# Patient Record
Sex: Male | Born: 1945 | Race: White | Hispanic: No | Marital: Single | Smoking: Former smoker
Health system: Southern US, Community
[De-identification: ages and names within clinical notes are randomized; demographics above are authoritative.]

## PROBLEM LIST (undated history)

## (undated) DIAGNOSIS — I4819 Other persistent atrial fibrillation: Secondary | ICD-10-CM

## (undated) DIAGNOSIS — I471 Supraventricular tachycardia, unspecified: Secondary | ICD-10-CM

## (undated) DIAGNOSIS — E039 Hypothyroidism, unspecified: Secondary | ICD-10-CM

## (undated) DIAGNOSIS — I10 Essential (primary) hypertension: Secondary | ICD-10-CM

## (undated) DIAGNOSIS — G4730 Sleep apnea, unspecified: Secondary | ICD-10-CM

## (undated) DIAGNOSIS — R972 Elevated prostate specific antigen [PSA]: Secondary | ICD-10-CM

## (undated) HISTORY — PX: APPENDECTOMY: SHX54

## (undated) HISTORY — PX: NM MYOVIEW LTD: HXRAD82

## (undated) HISTORY — DX: Sleep apnea, unspecified: G47.30

## (undated) HISTORY — DX: Elevated prostate specific antigen (PSA): R97.20

## (undated) HISTORY — DX: Hypothyroidism, unspecified: E03.9

## (undated) HISTORY — DX: Supraventricular tachycardia, unspecified: I47.10

## (undated) HISTORY — DX: Other persistent atrial fibrillation: I48.19

## (undated) HISTORY — DX: Morbid (severe) obesity due to excess calories: E66.01

## (undated) HISTORY — PX: TONSILLECTOMY: SUR1361

## (undated) HISTORY — DX: Supraventricular tachycardia: I47.1

## (undated) HISTORY — DX: Essential (primary) hypertension: I10

---

## 1999-12-25 HISTORY — PX: CARDIAC CATHETERIZATION: SHX172

## 2000-08-23 ENCOUNTER — Emergency Department (HOSPITAL_COMMUNITY): Admission: EM | Admit: 2000-08-23 | Discharge: 2000-08-23 | Payer: Self-pay | Admitting: Emergency Medicine

## 2000-08-23 ENCOUNTER — Encounter: Payer: Self-pay | Admitting: Emergency Medicine

## 2000-10-20 ENCOUNTER — Emergency Department (HOSPITAL_COMMUNITY): Admission: EM | Admit: 2000-10-20 | Discharge: 2000-10-20 | Payer: Self-pay | Admitting: Emergency Medicine

## 2000-11-14 ENCOUNTER — Inpatient Hospital Stay (HOSPITAL_COMMUNITY): Admission: EM | Admit: 2000-11-14 | Discharge: 2000-11-19 | Payer: Self-pay | Admitting: Emergency Medicine

## 2000-11-14 ENCOUNTER — Encounter: Payer: Self-pay | Admitting: Emergency Medicine

## 2000-12-03 ENCOUNTER — Encounter: Payer: Self-pay | Admitting: Emergency Medicine

## 2000-12-03 ENCOUNTER — Emergency Department (HOSPITAL_COMMUNITY): Admission: EM | Admit: 2000-12-03 | Discharge: 2000-12-03 | Payer: Self-pay | Admitting: Emergency Medicine

## 2000-12-24 HISTORY — PX: ABLATION: SHX5711

## 2000-12-27 ENCOUNTER — Encounter: Payer: Self-pay | Admitting: Emergency Medicine

## 2000-12-27 ENCOUNTER — Emergency Department (HOSPITAL_COMMUNITY): Admission: EM | Admit: 2000-12-27 | Discharge: 2000-12-27 | Payer: Self-pay | Admitting: Emergency Medicine

## 2000-12-30 ENCOUNTER — Ambulatory Visit (HOSPITAL_COMMUNITY): Admission: RE | Admit: 2000-12-30 | Discharge: 2000-12-31 | Payer: Self-pay | Admitting: Internal Medicine

## 2005-08-23 ENCOUNTER — Ambulatory Visit: Payer: Self-pay | Admitting: Internal Medicine

## 2005-10-10 ENCOUNTER — Ambulatory Visit: Payer: Self-pay | Admitting: Internal Medicine

## 2006-01-09 ENCOUNTER — Ambulatory Visit: Payer: Self-pay | Admitting: Internal Medicine

## 2006-03-04 ENCOUNTER — Ambulatory Visit: Payer: Self-pay | Admitting: Cardiology

## 2006-03-04 ENCOUNTER — Encounter: Payer: Self-pay | Admitting: Internal Medicine

## 2006-06-10 ENCOUNTER — Ambulatory Visit: Payer: Self-pay | Admitting: Internal Medicine

## 2006-11-26 ENCOUNTER — Ambulatory Visit: Payer: Self-pay | Admitting: Internal Medicine

## 2006-11-26 LAB — CONVERTED CEMR LAB
ALT: 27 units/L (ref 0–40)
AST: 25 units/L (ref 0–37)
BUN: 15 mg/dL (ref 6–23)
CO2: 30 meq/L (ref 19–32)
Calcium: 9.3 mg/dL (ref 8.4–10.5)
Chloride: 103 meq/L (ref 96–112)
Chol/HDL Ratio, serum: 4.6
Cholesterol: 154 mg/dL (ref 0–200)
Creatinine, Ser: 1.2 mg/dL (ref 0.4–1.5)
GFR calc non Af Amer: 65 mL/min
Glomerular Filtration Rate, Af Am: 79 mL/min/{1.73_m2}
Glucose, Bld: 99 mg/dL (ref 70–99)
HDL: 33.4 mg/dL — ABNORMAL LOW (ref >39.0)
LDL Cholesterol: 100 mg/dL — ABNORMAL HIGH (ref 0–99)
PSA: 6.35 ng/mL — ABNORMAL HIGH (ref 0.10–4.00)
Potassium: 3.9 meq/L (ref 3.5–5.1)
Sodium: 140 meq/L (ref 135–145)
Triglyceride fasting, serum: 101 mg/dL (ref 0–149)
VLDL: 20 mg/dL (ref 0–40)

## 2007-05-23 DIAGNOSIS — I10 Essential (primary) hypertension: Secondary | ICD-10-CM

## 2007-06-04 ENCOUNTER — Encounter (INDEPENDENT_AMBULATORY_CARE_PROVIDER_SITE_OTHER): Payer: Self-pay | Admitting: *Deleted

## 2007-06-11 ENCOUNTER — Telehealth: Payer: Self-pay | Admitting: Internal Medicine

## 2007-06-11 ENCOUNTER — Encounter (INDEPENDENT_AMBULATORY_CARE_PROVIDER_SITE_OTHER): Payer: Self-pay | Admitting: *Deleted

## 2007-06-12 ENCOUNTER — Ambulatory Visit: Payer: Self-pay | Admitting: Internal Medicine

## 2007-06-12 DIAGNOSIS — R972 Elevated prostate specific antigen [PSA]: Secondary | ICD-10-CM

## 2007-12-09 ENCOUNTER — Encounter (INDEPENDENT_AMBULATORY_CARE_PROVIDER_SITE_OTHER): Payer: Self-pay | Admitting: *Deleted

## 2008-01-08 ENCOUNTER — Telehealth (INDEPENDENT_AMBULATORY_CARE_PROVIDER_SITE_OTHER): Payer: Self-pay | Admitting: *Deleted

## 2008-01-12 ENCOUNTER — Encounter (INDEPENDENT_AMBULATORY_CARE_PROVIDER_SITE_OTHER): Payer: Self-pay | Admitting: *Deleted

## 2008-02-03 ENCOUNTER — Ambulatory Visit: Payer: Self-pay | Admitting: Internal Medicine

## 2008-02-03 DIAGNOSIS — G4700 Insomnia, unspecified: Secondary | ICD-10-CM

## 2008-02-06 LAB — CONVERTED CEMR LAB
BUN: 11 mg/dL (ref 6–23)
Basophils Relative: 2.4 % — ABNORMAL HIGH (ref 0.0–1.0)
CO2: 31 meq/L (ref 19–32)
Calcium: 9 mg/dL (ref 8.4–10.5)
Chloride: 103 meq/L (ref 96–112)
Creatinine, Ser: 1.2 mg/dL (ref 0.4–1.5)
Eosinophils Relative: 4.1 % (ref 0.0–5.0)
GFR calc Af Amer: 79 mL/min
GFR calc non Af Amer: 65 mL/min
Glucose, Bld: 83 mg/dL (ref 70–99)
HCT: 48.2 % (ref 39.0–52.0)
Hemoglobin: 15.6 g/dL (ref 13.0–17.0)
Lymphocytes Relative: 37.3 % (ref 12.0–46.0)
MCHC: 32.4 g/dL (ref 30.0–36.0)
MCV: 91.3 fL (ref 78.0–100.0)
Monocytes Relative: 25.4 % — ABNORMAL HIGH (ref 3.0–11.0)
Neutrophils Relative %: 30.8 % — ABNORMAL LOW (ref 43.0–77.0)
PSA: 5.31 ng/mL — ABNORMAL HIGH (ref 0.10–4.00)
Platelets: 184 10*3/uL (ref 150–400)
Potassium: 3.6 meq/L (ref 3.5–5.1)
RBC: 5.29 M/uL (ref 4.22–5.81)
RDW: 13.1 % (ref 11.5–14.6)
Sodium: 142 meq/L (ref 135–145)
TSH: 5.16 microintl units/mL (ref 0.35–5.50)
WBC: 7.8 10*3/uL (ref 4.5–10.5)

## 2008-04-23 ENCOUNTER — Telehealth (INDEPENDENT_AMBULATORY_CARE_PROVIDER_SITE_OTHER): Payer: Self-pay | Admitting: *Deleted

## 2008-09-15 ENCOUNTER — Emergency Department (HOSPITAL_COMMUNITY): Admission: EM | Admit: 2008-09-15 | Discharge: 2008-09-15 | Payer: Self-pay | Admitting: Emergency Medicine

## 2008-10-12 ENCOUNTER — Ambulatory Visit: Payer: Self-pay | Admitting: Internal Medicine

## 2008-10-13 ENCOUNTER — Encounter (INDEPENDENT_AMBULATORY_CARE_PROVIDER_SITE_OTHER): Payer: Self-pay | Admitting: *Deleted

## 2008-10-18 ENCOUNTER — Telehealth (INDEPENDENT_AMBULATORY_CARE_PROVIDER_SITE_OTHER): Payer: Self-pay | Admitting: *Deleted

## 2008-10-19 ENCOUNTER — Telehealth (INDEPENDENT_AMBULATORY_CARE_PROVIDER_SITE_OTHER): Payer: Self-pay | Admitting: *Deleted

## 2008-10-28 ENCOUNTER — Telehealth (INDEPENDENT_AMBULATORY_CARE_PROVIDER_SITE_OTHER): Payer: Self-pay | Admitting: *Deleted

## 2008-11-08 ENCOUNTER — Ambulatory Visit: Payer: Self-pay | Admitting: Cardiovascular Disease

## 2008-11-23 ENCOUNTER — Encounter: Payer: Self-pay | Admitting: Internal Medicine

## 2008-11-23 ENCOUNTER — Ambulatory Visit: Payer: Self-pay

## 2009-01-11 ENCOUNTER — Ambulatory Visit: Payer: Self-pay | Admitting: Internal Medicine

## 2009-01-11 DIAGNOSIS — G8929 Other chronic pain: Secondary | ICD-10-CM

## 2009-01-11 DIAGNOSIS — M549 Dorsalgia, unspecified: Secondary | ICD-10-CM

## 2009-01-14 ENCOUNTER — Telehealth (INDEPENDENT_AMBULATORY_CARE_PROVIDER_SITE_OTHER): Payer: Self-pay | Admitting: *Deleted

## 2009-01-14 LAB — CONVERTED CEMR LAB
CO2: 29 meq/L (ref 19–32)
Chloride: 108 meq/L (ref 96–112)
Potassium: 3.9 meq/L (ref 3.5–5.1)

## 2009-02-10 ENCOUNTER — Ambulatory Visit: Payer: Self-pay | Admitting: Internal Medicine

## 2009-02-15 ENCOUNTER — Telehealth (INDEPENDENT_AMBULATORY_CARE_PROVIDER_SITE_OTHER): Payer: Self-pay | Admitting: *Deleted

## 2009-03-02 ENCOUNTER — Encounter (INDEPENDENT_AMBULATORY_CARE_PROVIDER_SITE_OTHER): Payer: Self-pay | Admitting: *Deleted

## 2009-03-11 ENCOUNTER — Telehealth: Payer: Self-pay | Admitting: Internal Medicine

## 2009-03-15 ENCOUNTER — Inpatient Hospital Stay (HOSPITAL_COMMUNITY): Admission: EM | Admit: 2009-03-15 | Discharge: 2009-03-17 | Payer: Self-pay | Admitting: Emergency Medicine

## 2009-03-15 ENCOUNTER — Ambulatory Visit: Payer: Self-pay | Admitting: Internal Medicine

## 2009-03-17 ENCOUNTER — Encounter: Payer: Self-pay | Admitting: Cardiovascular Disease

## 2009-03-18 ENCOUNTER — Telehealth (INDEPENDENT_AMBULATORY_CARE_PROVIDER_SITE_OTHER): Payer: Self-pay | Admitting: *Deleted

## 2009-03-18 ENCOUNTER — Telehealth: Payer: Self-pay | Admitting: Internal Medicine

## 2009-03-23 ENCOUNTER — Ambulatory Visit: Payer: Self-pay | Admitting: Internal Medicine

## 2009-03-23 DIAGNOSIS — I4891 Unspecified atrial fibrillation: Secondary | ICD-10-CM | POA: Insufficient documentation

## 2009-03-23 DIAGNOSIS — E079 Disorder of thyroid, unspecified: Secondary | ICD-10-CM | POA: Insufficient documentation

## 2009-03-31 ENCOUNTER — Telehealth (INDEPENDENT_AMBULATORY_CARE_PROVIDER_SITE_OTHER): Payer: Self-pay | Admitting: *Deleted

## 2009-04-12 DIAGNOSIS — R011 Cardiac murmur, unspecified: Secondary | ICD-10-CM

## 2009-04-12 DIAGNOSIS — E785 Hyperlipidemia, unspecified: Secondary | ICD-10-CM | POA: Insufficient documentation

## 2009-04-12 DIAGNOSIS — G510 Bell's palsy: Secondary | ICD-10-CM

## 2009-04-13 ENCOUNTER — Telehealth (INDEPENDENT_AMBULATORY_CARE_PROVIDER_SITE_OTHER): Payer: Self-pay | Admitting: Nurse Practitioner

## 2009-04-13 ENCOUNTER — Encounter: Payer: Self-pay | Admitting: Cardiovascular Disease

## 2009-04-13 ENCOUNTER — Ambulatory Visit: Payer: Self-pay | Admitting: Cardiovascular Disease

## 2009-04-13 ENCOUNTER — Ambulatory Visit: Payer: Self-pay

## 2009-04-21 ENCOUNTER — Ambulatory Visit: Payer: Self-pay | Admitting: Internal Medicine

## 2009-05-06 ENCOUNTER — Telehealth (INDEPENDENT_AMBULATORY_CARE_PROVIDER_SITE_OTHER): Payer: Self-pay | Admitting: *Deleted

## 2009-05-11 ENCOUNTER — Telehealth (INDEPENDENT_AMBULATORY_CARE_PROVIDER_SITE_OTHER): Payer: Self-pay | Admitting: *Deleted

## 2009-06-06 ENCOUNTER — Telehealth (INDEPENDENT_AMBULATORY_CARE_PROVIDER_SITE_OTHER): Payer: Self-pay | Admitting: *Deleted

## 2009-07-04 ENCOUNTER — Ambulatory Visit: Payer: Self-pay | Admitting: Internal Medicine

## 2009-07-07 LAB — CONVERTED CEMR LAB
BUN: 19 mg/dL (ref 6–23)
Calcium: 9.1 mg/dL (ref 8.4–10.5)
GFR calc non Af Amer: 59.01 mL/min (ref >60)
Glucose, Bld: 93 mg/dL (ref 70–99)
Potassium: 4 meq/L (ref 3.5–5.1)
Sodium: 141 meq/L (ref 135–145)
T3, Free: 3.2 pg/mL (ref 2.3–4.2)
TSH: 4.31 microintl units/mL (ref 0.35–5.50)

## 2009-07-08 ENCOUNTER — Telehealth (INDEPENDENT_AMBULATORY_CARE_PROVIDER_SITE_OTHER): Payer: Self-pay | Admitting: *Deleted

## 2009-07-20 ENCOUNTER — Telehealth: Payer: Self-pay | Admitting: Internal Medicine

## 2009-08-10 ENCOUNTER — Telehealth: Payer: Self-pay | Admitting: Internal Medicine

## 2009-08-23 ENCOUNTER — Encounter (INDEPENDENT_AMBULATORY_CARE_PROVIDER_SITE_OTHER): Payer: Self-pay | Admitting: *Deleted

## 2009-08-23 ENCOUNTER — Telehealth (INDEPENDENT_AMBULATORY_CARE_PROVIDER_SITE_OTHER): Payer: Self-pay | Admitting: *Deleted

## 2009-09-13 ENCOUNTER — Telehealth (INDEPENDENT_AMBULATORY_CARE_PROVIDER_SITE_OTHER): Payer: Self-pay | Admitting: *Deleted

## 2009-09-29 ENCOUNTER — Telehealth (INDEPENDENT_AMBULATORY_CARE_PROVIDER_SITE_OTHER): Payer: Self-pay | Admitting: *Deleted

## 2009-09-30 ENCOUNTER — Telehealth: Payer: Self-pay | Admitting: Internal Medicine

## 2009-10-05 ENCOUNTER — Telehealth (INDEPENDENT_AMBULATORY_CARE_PROVIDER_SITE_OTHER): Payer: Self-pay | Admitting: *Deleted

## 2009-12-13 ENCOUNTER — Ambulatory Visit: Payer: Self-pay | Admitting: Internal Medicine

## 2009-12-14 ENCOUNTER — Encounter (INDEPENDENT_AMBULATORY_CARE_PROVIDER_SITE_OTHER): Payer: Self-pay | Admitting: *Deleted

## 2009-12-15 ENCOUNTER — Telehealth (INDEPENDENT_AMBULATORY_CARE_PROVIDER_SITE_OTHER): Payer: Self-pay | Admitting: *Deleted

## 2009-12-15 LAB — CONVERTED CEMR LAB
ALT: 43 units/L (ref 0–53)
BUN: 20 mg/dL (ref 6–23)
Calcium: 9.1 mg/dL (ref 8.4–10.5)
Eosinophils Relative: 3.6 % (ref 0.0–5.0)
GFR calc non Af Amer: 58.92 mL/min (ref >60)
Glucose, Bld: 91 mg/dL (ref 70–99)
HCT: 47.8 % (ref 39.0–52.0)
LDL Cholesterol: 121 mg/dL — ABNORMAL HIGH (ref 0–99)
Lymphocytes Relative: 36.8 % (ref 12.0–46.0)
Monocytes Relative: 13.9 % — ABNORMAL HIGH (ref 3.0–12.0)
PSA: 6.9 ng/mL — ABNORMAL HIGH (ref 0.10–4.00)
Platelets: 136 10*3/uL — ABNORMAL LOW (ref 150.0–400.0)
Potassium: 3.9 meq/L (ref 3.5–5.1)
RDW: 12.8 % (ref 11.5–14.6)
Sodium: 142 meq/L (ref 135–145)
TSH: 3.81 microintl units/mL (ref 0.35–5.50)
Total CHOL/HDL Ratio: 6
WBC: 8 10*3/uL (ref 4.5–10.5)

## 2009-12-20 ENCOUNTER — Encounter (INDEPENDENT_AMBULATORY_CARE_PROVIDER_SITE_OTHER): Payer: Self-pay | Admitting: *Deleted

## 2010-01-06 ENCOUNTER — Telehealth (INDEPENDENT_AMBULATORY_CARE_PROVIDER_SITE_OTHER): Payer: Self-pay | Admitting: *Deleted

## 2010-01-09 ENCOUNTER — Telehealth (INDEPENDENT_AMBULATORY_CARE_PROVIDER_SITE_OTHER): Payer: Self-pay | Admitting: *Deleted

## 2010-01-09 ENCOUNTER — Encounter: Payer: Self-pay | Admitting: Internal Medicine

## 2010-01-19 ENCOUNTER — Ambulatory Visit: Payer: Self-pay | Admitting: Internal Medicine

## 2010-01-19 DIAGNOSIS — E669 Obesity, unspecified: Secondary | ICD-10-CM

## 2010-01-19 DIAGNOSIS — F17200 Nicotine dependence, unspecified, uncomplicated: Secondary | ICD-10-CM | POA: Insufficient documentation

## 2010-01-20 ENCOUNTER — Encounter (INDEPENDENT_AMBULATORY_CARE_PROVIDER_SITE_OTHER): Payer: Self-pay | Admitting: *Deleted

## 2010-02-07 ENCOUNTER — Telehealth: Payer: Self-pay | Admitting: Internal Medicine

## 2010-04-10 ENCOUNTER — Telehealth (INDEPENDENT_AMBULATORY_CARE_PROVIDER_SITE_OTHER): Payer: Self-pay | Admitting: *Deleted

## 2010-05-30 ENCOUNTER — Telehealth: Payer: Self-pay | Admitting: Internal Medicine

## 2010-06-05 ENCOUNTER — Telehealth (INDEPENDENT_AMBULATORY_CARE_PROVIDER_SITE_OTHER): Payer: Self-pay | Admitting: *Deleted

## 2010-07-05 ENCOUNTER — Telehealth (INDEPENDENT_AMBULATORY_CARE_PROVIDER_SITE_OTHER): Payer: Self-pay | Admitting: *Deleted

## 2010-07-12 ENCOUNTER — Telehealth (INDEPENDENT_AMBULATORY_CARE_PROVIDER_SITE_OTHER): Payer: Self-pay | Admitting: *Deleted

## 2010-07-19 ENCOUNTER — Ambulatory Visit: Payer: Self-pay | Admitting: Family Medicine

## 2010-07-19 DIAGNOSIS — B354 Tinea corporis: Secondary | ICD-10-CM | POA: Insufficient documentation

## 2010-08-07 ENCOUNTER — Telehealth (INDEPENDENT_AMBULATORY_CARE_PROVIDER_SITE_OTHER): Payer: Self-pay | Admitting: *Deleted

## 2010-08-22 ENCOUNTER — Telehealth (INDEPENDENT_AMBULATORY_CARE_PROVIDER_SITE_OTHER): Payer: Self-pay | Admitting: *Deleted

## 2010-09-05 ENCOUNTER — Telehealth (INDEPENDENT_AMBULATORY_CARE_PROVIDER_SITE_OTHER): Payer: Self-pay | Admitting: *Deleted

## 2010-09-13 ENCOUNTER — Ambulatory Visit: Payer: Self-pay | Admitting: Internal Medicine

## 2010-09-15 LAB — CONVERTED CEMR LAB
Calcium: 9.7 mg/dL (ref 8.4–10.5)
GFR calc non Af Amer: 61.51 mL/min (ref >60)
Glucose, Bld: 85 mg/dL (ref 70–99)
Potassium: 3.9 meq/L (ref 3.5–5.1)
Sodium: 140 meq/L (ref 135–145)

## 2010-09-25 ENCOUNTER — Telehealth: Payer: Self-pay | Admitting: Internal Medicine

## 2010-10-03 ENCOUNTER — Telehealth: Payer: Self-pay | Admitting: Internal Medicine

## 2010-10-03 ENCOUNTER — Telehealth (INDEPENDENT_AMBULATORY_CARE_PROVIDER_SITE_OTHER): Payer: Self-pay | Admitting: *Deleted

## 2010-10-18 ENCOUNTER — Telehealth: Payer: Self-pay | Admitting: Internal Medicine

## 2010-11-03 ENCOUNTER — Telehealth (INDEPENDENT_AMBULATORY_CARE_PROVIDER_SITE_OTHER): Payer: Self-pay | Admitting: *Deleted

## 2010-11-23 ENCOUNTER — Telehealth (INDEPENDENT_AMBULATORY_CARE_PROVIDER_SITE_OTHER): Payer: Self-pay | Admitting: *Deleted

## 2010-12-05 ENCOUNTER — Telehealth (INDEPENDENT_AMBULATORY_CARE_PROVIDER_SITE_OTHER): Payer: Self-pay | Admitting: *Deleted

## 2010-12-14 ENCOUNTER — Ambulatory Visit: Payer: Self-pay | Admitting: Internal Medicine

## 2010-12-19 ENCOUNTER — Encounter (INDEPENDENT_AMBULATORY_CARE_PROVIDER_SITE_OTHER): Payer: Self-pay | Admitting: *Deleted

## 2010-12-19 ENCOUNTER — Telehealth (INDEPENDENT_AMBULATORY_CARE_PROVIDER_SITE_OTHER): Payer: Self-pay | Admitting: *Deleted

## 2010-12-21 ENCOUNTER — Telehealth (INDEPENDENT_AMBULATORY_CARE_PROVIDER_SITE_OTHER): Payer: Self-pay | Admitting: *Deleted

## 2010-12-27 ENCOUNTER — Encounter (INDEPENDENT_AMBULATORY_CARE_PROVIDER_SITE_OTHER): Payer: Self-pay | Admitting: *Deleted

## 2010-12-29 LAB — CONVERTED CEMR LAB
AST: 26 units/L (ref 0–37)
Basophils Relative: 0.6 % (ref 0.0–3.0)
CO2: 27 meq/L (ref 19–32)
Chloride: 103 meq/L (ref 96–112)
Cholesterol: 169 mg/dL (ref 0–200)
Creatinine, Ser: 1.3 mg/dL (ref 0.4–1.5)
Eosinophils Relative: 2.1 % (ref 0.0–5.0)
Free T4: 0.9 ng/dL (ref 0.60–1.60)
HDL: 31.9 mg/dL — ABNORMAL LOW (ref >39.00)
Hemoglobin: 14.9 g/dL (ref 13.0–17.0)
MCV: 90.5 fL (ref 78.0–100.0)
Monocytes Absolute: 0.8 10*3/uL (ref 0.1–1.0)
Neutro Abs: 5.7 10*3/uL (ref 1.4–7.7)
Neutrophils Relative %: 57.7 % (ref 43.0–77.0)
PSA: 5.4 ng/mL — ABNORMAL HIGH (ref 0.10–4.00)
Potassium: 3.6 meq/L (ref 3.5–5.1)
RBC: 4.88 M/uL (ref 4.22–5.81)
Sodium: 140 meq/L (ref 135–145)
T3, Free: 3.7 pg/mL (ref 2.3–4.2)
WBC: 9.9 10*3/uL (ref 4.5–10.5)

## 2011-01-02 ENCOUNTER — Telehealth (INDEPENDENT_AMBULATORY_CARE_PROVIDER_SITE_OTHER): Payer: Self-pay | Admitting: *Deleted

## 2011-01-03 ENCOUNTER — Telehealth: Payer: Self-pay | Admitting: Internal Medicine

## 2011-01-11 ENCOUNTER — Telehealth: Payer: Self-pay | Admitting: Internal Medicine

## 2011-01-21 LAB — CONVERTED CEMR LAB
BUN: 13 mg/dL (ref 6–23)
CO2: 30 meq/L (ref 19–32)
Calcium: 9.1 mg/dL (ref 8.4–10.5)
Chloride: 104 meq/L (ref 96–112)
Chloride: 104 meq/L (ref 96–112)
Cholesterol: 170 mg/dL (ref 0–200)
Creatinine, Ser: 1.1 mg/dL (ref 0.4–1.5)
GFR calc non Af Amer: 59.06 mL/min (ref >60)
Glucose, Bld: 76 mg/dL (ref 70–99)
HDL: 30.9 mg/dL — ABNORMAL LOW (ref >39.0)
Potassium: 4.3 meq/L (ref 3.5–5.1)
Triglycerides: 126 mg/dL (ref 0–149)

## 2011-01-23 NOTE — Progress Notes (Signed)
Summary: refill  Phone Note Refill Request Message from:  Fax from Pharmacy on July 12, 2010 9:18 AM  Refills Requested: Medication #1:  OMEPRAZOLE 40 MG CPDR 1 by mouth once daily [facility] - fax [phone] - tel [phone]   Method Requested:   Initial call taken by: Okey Regal Spring,  July 12, 2010 9:20 AM    Prescriptions: OMEPRAZOLE 40 MG CPDR (OMEPRAZOLE) 1 by mouth once daily  #30 x 2   Entered by:   [name] CMA   Authorized by:   Nolon Rod. Paz MD   Signed by:   [name] CMA on 07/12/2010   Method used:   Electronically to        [facility] (retail)       [address]       [place], [place]  [address]       Ph: [phone]       Fax: [phone]   RxID:   [id number]

## 2011-01-23 NOTE — Progress Notes (Signed)
Summary: REFILL  Phone Note Refill Request Message from:  Fax from Pharmacy on February 07, 2010 4:49 PM  Refills Requested: Medication #1:  XANAX 0.5 MG  TABS 2 by mouth at bedtime as needed insomnia METOPROLOL SUCCINATE 200MG  [facility] FAX [phone]   Method Requested: Fax to Local Pharmacy Next Appointment Scheduled: JUNE 21,2011 Initial call taken by: Barb Merino,  February 07, 2010 4:50 PM  Follow-up for Phone Call        xanax - was rx'd #60 with 6 refills 08/12/2009 Shary Decamp  February 07, 2010 5:19 PM ok 60 and 6 RF Shali Vesey E. Haeven Nickle MD  February 08, 2010 12:10 PM  faxed to [facility]  February 08, 2010 2:36 PM     Prescriptions: TOPROL XL 200 MG  TB24 (METOPROLOL SUCCINATE) 1/2 by mouth qd  #45 x 1   Entered by:   Shary Decamp   Authorized by:   Nolon Rod. Trystyn Dolley MD   Signed by:   Shary Decamp on 02/08/2010   Method used:   Print then Give to Patient   RxID:   [id number] XANAX 0.5 MG  TABS (ALPRAZOLAM) 2 by mouth at bedtime as needed insomnia  #60 x 6   Entered by:   Shary Decamp   Authorized by:   Nolon Rod. Tarek Cravens MD   Signed by:   Shary Decamp on 02/08/2010   Method used:   Print then Give to Patient   RxID:   [id number]

## 2011-01-23 NOTE — Progress Notes (Signed)
Summary: refill  Phone Note Refill Request Message from:  Pharmacy on November 03, 2010 9:16 AM  Refills Requested: Medication #1:  TOPROL XL 200 MG  TB24 1/2 by mouth qd [facility] - fax [phone]  Initial call taken by: Okey Regal Spring,  November 03, 2010 9:31 AM    Prescriptions: TOPROL XL 200 MG  TB24 (METOPROLOL SUCCINATE) 1/2 by mouth qd  #45 x 0   Entered by:   [name] CMA   Authorized by:   Nolon Rod. Paz MD   Signed by:   [name] CMA on 11/03/2010   Method used:   Electronically to        [facility] (retail)       [address]       [place], [place]  [address]       Ph: [phone]       Fax: [phone]   RxID:   [id number]

## 2011-01-23 NOTE — Progress Notes (Signed)
Summary: refill request  Phone Note Refill Request Call back at [phone] Message from:  Pharmacy on August 07, 2010 9:41 AM  Refills Requested: Medication #1:  TOPROL XL 200 MG  TB24 1/2 by mouth qd   Dosage confirmed as above?Dosage Confirmed   Supply Requested: 1 month   Last Refilled: 07/05/2010  Medication #2:  CARDIZEM CD 120 MG XR24H-CAP take 1 tab once daily   Dosage confirmed as above?Dosage Confirmed   Supply Requested: 1 month   Last Refilled: 07/05/2010 [name]  Next Appointment Scheduled: 09/13/10 Initial call taken by: Lavell Islam,  August 07, 2010 9:42 AM    Prescriptions: TOPROL XL 200 MG  TB24 (METOPROLOL SUCCINATE) 1/2 by mouth qd  #45 x 0   Entered by:   [name] CMA   Authorized by:   Nolon Rod. Paz MD   Signed by:   [name] CMA on 08/07/2010   Method used:   Electronically to        [facility]* (retail)       [address]       [place], [place]  [address]       Ph: [phone]       Fax: [phone]   RxID:   [id number] CARDIZEM CD 120 MG XR24H-CAP (DILTIAZEM HCL COATED BEADS) take 1 tab once daily  #30 x 0   Entered by:   [name] CMA   Authorized by:   Nolon Rod. Paz MD   Signed by:   [name] CMA on 08/07/2010   Method used:   Electronically to        [facility] (retail)       [address]       [place], [place]  [address]       Ph: [phone]       Fax: [phone]   RxID:   [id number]

## 2011-01-23 NOTE — Progress Notes (Signed)
Summary: Refill Request  Phone Note Refill Request Message from:  Pharmacy on [facility] Fax #: [phone]  Refills Requested: Medication #1:  OMEPRAZOLE 40 MG CPDR 1 by mouth once daily   Dosage confirmed as above?Dosage Confirmed   Brand Name Necessary? No   Supply Requested: 1 month   Last Refilled: 03/08/2010  Medication #2:  HYDROCHLOROTHIAZIDE 25 MG TABS 1 by mouth once daily   Dosage confirmed as above?Dosage Confirmed   Supply Requested: 1 month   Last Refilled: 01/06/2010 Next Appointment Scheduled: 6.21.11 Initial call taken by: Harold Barban,  April 10, 2010 9:11 AM    Prescriptions: OMEPRAZOLE 40 MG CPDR (OMEPRAZOLE) 1 by mouth once daily  #30 x 2   Entered by:   Kandice Hams   Authorized by:   Nolon Rod. Paz MD   Signed by:   Kandice Hams on 04/10/2010   Method used:   Faxed to ...       [facility] (retail)       [address]       [place], [place]  [address]       Ph: [phone]       Fax: [phone]   RxID:   [id number] HYDROCHLOROTHIAZIDE 25 MG TABS (HYDROCHLOROTHIAZIDE) 1 by mouth once daily  #30 x 2   Entered by:   Kandice Hams   Authorized by:   Nolon Rod. Paz MD   Signed by:   Kandice Hams on 04/10/2010   Method used:   Faxed to ...       [facility] (retail)       [address]       [place], [place]  [address]       Ph: [phone]       Fax: [phone]   RxID:   [id number]

## 2011-01-23 NOTE — Progress Notes (Signed)
Summary: REFILL  Phone Note Refill Request Message from:  Fax from Pharmacy on [facility] [phone]  Refills Requested: Medication #1:  HYDROCHLOROTHIAZIDE 25 MG TABS 1 by mouth once daily  Medication #2:  PROTONIX 40 MG TBEC 1 by mouth at bedtime before dinner Initial call taken by: Barb Merino,  January 06, 2010 9:06 AM    Prescriptions: PROTONIX 40 MG TBEC (PANTOPRAZOLE SODIUM) 1 by mouth at bedtime before dinner  #30 x 2   Entered by:   Shary Decamp   Authorized by:   Nolon Rod. Paz MD   Signed by:   Shary Decamp on 01/06/2010   Method used:   Electronically to        [facility] (retail)       [address]       [place], [place]  [address]       Ph: [phone]       Fax: [phone]   RxID:   [id number] HYDROCHLOROTHIAZIDE 25 MG TABS (HYDROCHLOROTHIAZIDE) 1 by mouth once daily  #30 x 2   Entered by:   Shary Decamp   Authorized by:   Nolon Rod. Paz MD   Signed by:   Shary Decamp on 01/06/2010   Method used:   Electronically to        [facility] (retail)       [address]       [place], [place]  [address]       Ph: [phone]       Fax: [phone]   RxID:   [id number]

## 2011-01-23 NOTE — Assessment & Plan Note (Signed)
Summary: possible infection on stomach/kn   Vital Signs:  Patient profile:   65 year old Warren [name] Height:      71 inches Weight:      273 pounds Temp:     98.1 degrees F oral Pulse rate:   66 / minute BP sitting:   140 / 98  (left arm)  Vitals Entered By: Jeremy Johann CMA (July 19, 2010 10:58 AM) CC: infection on stomach and leg   History of Present Illness: pt here c/o rash on abd that burns--he just noticed it yesterday. He also c/o spots on legs that are very itchy.  He admits to sitting outside a alot.    Current Medications (verified): 1)  Bayer Aspirin 325 Mg  Tabs (Aspirin) 2)  Cardizem Cd 120 Mg Xr24h-Cap (Diltiazem Hcl Coated Beads) .... Take 1 Tab Once Daily 3)  Flecainide Acetate 150 Mg Tabs (Flecainide Acetate) .... Two Times A Day 4)  Hydrochlorothiazide 25 Mg Tabs (Hydrochlorothiazide) .Marland Kitchen.. 1 By Mouth Once Daily 5)  Klor-Con M20 20 Meq Cr-Tabs (Potassium Chloride Crys Cr) .... 3 By Mouth Once Daily 6)  Toprol Xl 200 Mg  Tb24 (Metoprolol Succinate) .... 1/2 By Mouth Qd 7)  Nitroglycerin 0.4 Mg  Subl (Nitroglycerin) .... One Sublingual Every 5 Minutes 3 Times P.r.n. Chest Pain 8)  One-Daily Multivitamins   Tabs (Multiple Vitamin) 9)  Xanax 0.5 Mg  Tabs (Alprazolam) .... 2 By Mouth At Bedtime As Needed Insomnia 10)  Omeprazole 40 Mg Cpdr (Omeprazole) .Marland Kitchen.. 1 By Mouth Once Daily 11)  Vicodin Hp 10-660 Mg Tabs (Hydrocodone-Acetaminophen) .... Two Times A Day 12)  Vitamin E 13)  Coq10 14)  Cod Liver Oil  Oil (Cod Liver Oil) .Marland Kitchen.. 1 By Mouth Once Daily 15)  Lotrisone 1-0.05 % Crea (Clotrimazole-Betamethasone) .... Apply To Abd Two Times A Day 16)  Bactroban 2 % Oint (Mupirocin) .... Apply To Leg Three Times A Day  Allergies (verified): No Known Drug Allergies  Past History:  Past medical, surgical, family and social histories (including risk factors) reviewed for relevance to current acute and chronic problems.  Past Medical History: Reviewed history from 07/04/2009  and no changes required. Hypertension increased PSA Cardiac cath 2001: no CAD Supraventricular tachycardia in 2002.  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  He underwent radiofrequency ablation.   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  myoview 12-09: neg  02-2009: . Atrial fibrillation with rapid ventricular response      Past Surgical History: Reviewed history from 04/12/2009 and no changes required. Appendectomy Cardiac cath 2001: no CAD status post ablation in 2007.  myoview 12-09: neg  tonsillectomy   Family History: Reviewed history from 04/12/2009 and no changes required. adopted  Social History: Reviewed history from 12/13/2009 and no changes required. plays at the [facility] often and stays up all night twice a week Single no kids tobacco-- yes , 2 "large" cigars daily  ETOH-- rarely   Review of Systems      See HPI  Physical Exam  General:  Well-developed,well-nourished,in no acute distress; alert,appropriate and cooperative throughout examinationoverweight-appearing.   Skin:  folds of skin on abd--+ errythema   + papule on both legs and few on arms 1 lesion R Low leg--errythematous  Psych:  Oriented X3 and normally interactive.     Impression & Recommendations:  Problem # 1:  TINEA CORPORIS (ICD-110.5) lotrisone two  times a day   Problem # 2:  INSECT BITE, INFECTED (ICD-919.5) bactroban three times a day  if no better in 3-4 days call and abx will be called in  Complete Medication List: 1)  Bayer Aspirin 325 Mg Tabs (Aspirin) 2)  Cardizem Cd 120 Mg Xr24h-cap (Diltiazem hcl coated beads) .... Take 1 tab once daily 3)  Flecainide Acetate 150 Mg Tabs (Flecainide acetate) .... Two times a day 4)  Hydrochlorothiazide 25 Mg Tabs (Hydrochlorothiazide) .Marland Kitchen.. 1 by mouth once  daily 5)  Klor-con M20 20 Meq Cr-tabs (Potassium chloride crys cr) .... 3 by mouth once daily 6)  Toprol Xl 200 Mg Tb24 (Metoprolol succinate) .... 1/2 by mouth qd 7)  Nitroglycerin 0.4 Mg Subl (Nitroglycerin) .... One sublingual every 5 minutes 3 times p.r.n. chest pain 8)  One-daily Multivitamins Tabs (Multiple vitamin) 9)  Xanax 0.5 Mg Tabs (Alprazolam) .... 2 by mouth at bedtime as needed insomnia 10)  Omeprazole 40 Mg Cpdr (Omeprazole) .Marland Kitchen.. 1 by mouth once daily 11)  Vicodin Hp 10-660 Mg Tabs (Hydrocodone-acetaminophen) .... Two times a day 12)  Vitamin E  13)  Coq10  14)  Cod Liver Oil Oil (Cod liver oil) .Marland Kitchen.. 1 by mouth once daily 15)  Lotrisone 1-0.05 % Crea (Clotrimazole-betamethasone) .... Apply to abd two times a day 16)  Bactroban 2 % Oint (Mupirocin) .... Apply to leg three times a day Prescriptions: BACTROBAN 2 % OINT (MUPIROCIN) apply to leg three times a day  #15g x 0   Entered and Authorized by:   Loreen Freud DO   Signed by:   Loreen Freud DO on 07/19/2010   Method used:   Electronically to        [facility] (retail)       [address]       [place], [place]  [address]       Ph: [phone]       Fax: [phone]   RxID:   [id number] LOTRISONE 1-0.05 % CREA (CLOTRIMAZOLE-BETAMETHASONE) apply to abd two times a day  #30g x 0   Entered and Authorized by:   Loreen Freud DO   Signed by:   Loreen Freud DO on 07/19/2010   Method used:   Electronically to        [facility] (retail)       [address]       [place], [place]  [address]       Ph: [phone]       Fax: [phone]   RxID:   [id number]

## 2011-01-23 NOTE — Medication Information (Signed)
Summary: Approval for Additional Quantity Omeprazole/[facility]  Approval for Additional Quantity Omeprazole/[facility]   Imported By: Lanelle Bal 01/26/2010 12:44:41  _____________________________________________________________________  External Attachment:    Type:   Image     Comment:   External Document

## 2011-01-23 NOTE — Progress Notes (Signed)
Summary: Pantoprazole a non preferred drug//[facility] rx changed to Omeprazol  Phone Note Call from Patient Call back at [facility] Phone [phone]   Caller: Patient Reason for Call: Talk to Nurse Summary of Call: pt called, says got a call from [facility] told him Protonix is no longer covered, Nexium is still covered on his insurance.  Called pharmacy for clarification. Per [facility], Pantoprazole is a non-preferred drug.  Recommendations are Omeprazole Initial call taken by: Kandice Hams,  January 09, 2010 10:02 AM  Follow-up for Phone Call        he can try: Omeprazole 40 mg daily Nexium 40 mg daily what ever he prefers Follow-up by: Nolon Rod. Paz MD,  January 09, 2010 3:06 PM  Additional Follow-up for Phone Call Additional follow up Details #1::        Because pt is on 40 mg will ned prior auth for qty overide Prior Auth in process .Kandice Hams  January 09, 2010 4:39 PM     Additional Follow-up for Phone Call Additional follow up Details #2::    left msg for pt new rx faxed to [name] .Kandice Hams  January 10, 2010 11:37 AM  Follow-up by: Kandice Hams,  January 10, 2010 11:37 AM  New/Updated Medications: OMEPRAZOLE 40 MG CPDR (OMEPRAZOLE) 1 by mouth once daily Prescriptions: OMEPRAZOLE 40 MG CPDR (OMEPRAZOLE) 1 by mouth once daily  #30 x 2   Entered by:   Kandice Hams   Authorized by:   Nolon Rod. Paz MD   Signed by:   Kandice Hams on 01/10/2010   Method used:   Faxed to ...       [facility] (retail)       [address]       [place], [place]  [address]       Ph: [phone]       Fax: [phone]   RxID:   [id number]

## 2011-01-23 NOTE — Progress Notes (Signed)
Summary: refill omeprazole  Phone Note Refill Request Message from:  Fax from Pharmacy on November 23, 2010 10:01 AM  Refills Requested: Medication #1:  OMEPRAZOLE 40 MG CPDR 1 by mouth once daily [facility] - fax [phone]  Initial call taken by: Okey Regal Spring,  November 23, 2010 10:03 AM    Prescriptions: OMEPRAZOLE 40 MG CPDR (OMEPRAZOLE) 1 by mouth once daily  #30 x 2   Entered by:   [name] CMA   Authorized by:   Nolon Rod. Paz MD   Signed by:   [name] CMA on 11/23/2010   Method used:   Electronically to        [facility] (retail)       [address]       [place], [place]  [address]       Ph: [phone]       Fax: [phone]   RxID:   [id number]

## 2011-01-23 NOTE — Progress Notes (Signed)
Summary: refill  Phone Note Refill Request Message from:  Fax from Pharmacy on October 03, 2010 8:06 AM  Refills Requested: Medication #1:  HYDROCHLOROTHIAZIDE 25 MG TABS 1 by mouth once daily [facility] - fax [phone]  Initial call taken by: Okey Regal Spring,  October 03, 2010 8:07 AM    Prescriptions: HYDROCHLOROTHIAZIDE 25 MG TABS (HYDROCHLOROTHIAZIDE) 1 by mouth once daily  #30 x 3   Entered by:   [name] CMA   Authorized by:   Nolon Rod. Paz MD   Signed by:   [name] CMA on 10/03/2010   Method used:   Electronically to        [facility] (retail)       [address]       [place], [place]  [address]       Ph: [phone]       Fax: [phone]   RxID:   [id number]

## 2011-01-23 NOTE — Assessment & Plan Note (Signed)
Summary: 6 MTH FU/NS/KDC/rescd/cbs   Vital Signs:  Patient profile:   65 year old male Weight:      265.50 pounds Pulse rate:   67 / minute Pulse rhythm:   regular BP sitting:   136 / 88  (left arm) Cuff size:   large  Vitals Entered By: [name] CMA (September 13, 2010 1:15 PM) CC: Pt here for 6 month f/u, fasting, no concerns Comments [facility] flu shot   History of Present Illness: ROV   lesion at the L side of the face, increase in size, then get dry, then increaee in size again  Hypertension-- no ambulatory BPs  increased PSA-- was refered to urology , decided not to go    Atrial fibrillation -- note from cards 1-11 reviewed , stable, was rec to RTC 1 year    Current Medications (verified): 1)  Bayer Aspirin 325 Mg  Tabs (Aspirin) 2)  Cardizem Cd 120 Mg Xr24h-Cap (Diltiazem Hcl Coated Beads) .... Take 1 Tab Once Daily 3)  Flecainide Acetate 150 Mg Tabs (Flecainide Acetate) .... Two Times A Day 4)  Hydrochlorothiazide 25 Mg Tabs (Hydrochlorothiazide) .Marland Kitchen.. 1 By Mouth Once Daily 5)  Klor-Con M20 20 Meq Cr-Tabs (Potassium Chloride Crys Cr) .... 3 By Mouth Once Daily 6)  Toprol Xl 200 Mg  Tb24 (Metoprolol Succinate) .... 1/2 By Mouth Qd 7)  Nitroglycerin 0.4 Mg  Subl (Nitroglycerin) .... One Sublingual Every 5 Minutes 3 Times P.r.n. Chest Pain 8)  One-Daily Multivitamins   Tabs (Multiple Vitamin) 9)  Xanax 0.5 Mg  Tabs (Alprazolam) .... 2 By Mouth At Bedtime As Needed Insomnia 10)  Omeprazole 40 Mg Cpdr (Omeprazole) .Marland Kitchen.. 1 By Mouth Once Daily 11)  Vicodin Hp 10-660 Mg Tabs (Hydrocodone-Acetaminophen) .... Two Times A Day 12)  Vitamin E 13)  Coq10 14)  Cod Liver Oil  Oil (Cod Liver Oil) .Marland Kitchen.. 1 By Mouth Once Daily  Allergies (verified): No Known Drug Allergies  Past History:  Past Medical History: Hypertension increased PSA Cardiac cath 2001: no CAD Supraventricular tachycardia in 2002.  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  He underwent radiofrequency ablation.   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  myoview 12-09: neg  02-2009: . Atrial fibrillation with rapid ventricular response      Past Surgical History: Reviewed history from 04/12/2009 and no changes required. Appendectomy Cardiac cath 2001: no CAD status post ablation in 2007.  myoview 12-09: neg  tonsillectomy   Social History: Reviewed history from 12/13/2009 and no changes required. plays at the [employer] often and stays up all night twice a week Single no kids tobacco-- yes , 2 "large" cigars daily  ETOH-- rarely   Review of Systems CV:  Denies chest pain or discomfort and swelling of feet. Resp:  Denies cough and coughing up blood. GU:  Denies dysuria, urinary frequency, and urinary hesitancy.  Physical Exam  General:  alert and well-developed.   Lungs:  normal respiratory effort, no intercostal retractions, no accessory muscle use, and normal breath sounds.   Heart:  normal rate, regular rhythm, and no murmur.   Psych:  Oriented X3, memory intact for recent and remote, normally interactive, good eye contact, not anxious appearing, and not depressed appearing.     Impression & Recommendations:  Problem # 1:  SKIN LESION (ICD-709.9)  will refer to derm  "no Mondays or  Fridays"  Orders: Dermatology Referral (Derma)  Problem # 2:  TOBACCO ABUSE (ICD-305.1) "you don't like to talk about that"  Problem # 3:  HYPERTENSION (ICD-401.9)  His updated medication list for this problem includes:    Cardizem Cd 120 Mg Xr24h-cap (Diltiazem hcl coated beads) .Marland Kitchen... Take 1 tab once daily    Hydrochlorothiazide 25 Mg Tabs (Hydrochlorothiazide) .Marland Kitchen... 1 by mouth once daily    Toprol Xl 200 Mg Tb24 (Metoprolol succinate) .Marland Kitchen... 1/2 by mouth qd  BP today: 136/88 Prior BP: 140/98  (07/19/2010)  Prior 10 Yr Risk Heart Disease: 40 % (04/13/2009)  Labs Reviewed: K+: 3.9 (12/13/2009) Creat: : 1.3 (12/13/2009)   Chol: 179 (12/13/2009)   HDL: 32.40 (12/13/2009)   LDL: 121 (12/13/2009)   TG: 130.0 (12/13/2009)  Orders: Venipuncture (10272) TLB-BMP (Basic Metabolic Panel-BMET) (80048-METABOL) Specimen Handling (53664)  Problem # 4:  PSA, INCREASED (ICD-790.93) was referred to urology but declined to go Explained that he may have prostate cancer and the early he gets treatment the better the outcome. The patient understood but states that  is his  choice and he won't go.  Complete Medication List: 1)  Bayer Aspirin 325 Mg Tabs (Aspirin) 2)  Cardizem Cd 120 Mg Xr24h-cap (Diltiazem hcl coated beads) .... Take 1 tab once daily 3)  Flecainide Acetate 150 Mg Tabs (Flecainide acetate) .... Two times a day 4)  Hydrochlorothiazide 25 Mg Tabs (Hydrochlorothiazide) .Marland Kitchen.. 1 by mouth once daily 5)  Klor-con M20 20 Meq Cr-tabs (Potassium chloride crys cr) .... 3 by mouth once daily 6)  Toprol Xl 200 Mg Tb24 (Metoprolol succinate) .... 1/2 by mouth qd 7)  Nitroglycerin 0.4 Mg Subl (Nitroglycerin) .... One sublingual every 5 minutes 3 times p.r.n. chest pain 8)  One-daily Multivitamins Tabs (Multiple vitamin) 9)  Xanax 0.5 Mg Tabs (Alprazolam) .... 2 by mouth at bedtime as needed insomnia 10)  Omeprazole 40 Mg Cpdr (Omeprazole) .Marland Kitchen.. 1 by mouth once daily 11)  Vicodin Hp 10-660 Mg Tabs (Hydrocodone-acetaminophen) .... Two times a day 12)  Vitamin E  13)  Coq10  14)  Cod Liver Oil Oil (Cod liver oil) .Marland Kitchen.. 1 by mouth once daily  Other Orders: Admin 1st Vaccine (40347) Flu Vaccine 30yrs + (42595)  Patient Instructions: 1)  Please schedule a follow-up appointment in 3 to 4  months , fasting physical  Flu Vaccine Consent Questions     Do you have a history of severe allergic reactions to this vaccine? no    Any prior history of allergic reactions to egg and/or gelatin? no    Do you  have a sensitivity to the preservative Thimersol? no    Do you have a past history of Guillan-Barre Syndrome? no    Do you currently have an acute febrile illness? no    Have you ever had a severe reaction to latex? no    Vaccine information given and explained to patient? yes    Are you currently pregnant? no    Lot Number:[id number]   Exp Date:06/23/2011   Site Given  Left Deltoid IM   .lbflu

## 2011-01-23 NOTE — Progress Notes (Signed)
Summary: refill  Phone Note Refill Request Message from:  Fax from Pharmacy on August 22, 2010 8:07 AM  Refills Requested: Medication #1:  XANAX 0.5 MG  TABS 2 by mouth at bedtime as needed insomnia [facility] - fax [phone]  Initial call taken by: Okey Regal Spring,  August 22, 2010 8:10 AM  Follow-up for Phone Call        last refilled 02/08/10 x 6. [name] CMA  August 22, 2010 8:22 AM   Additional Follow-up for Phone Call Additional follow up Details #1::        okay #60, no refill.  Call patient, needs a routine followup Additional Follow-up by: Jose E. Paz MD,  August 22, 2010 8:41 AM    Additional Follow-up for Phone Call Additional follow up Details #2::    [email or web address] Caldwell Negrete  August 22, 2010 9:05 AM  Additional Follow-up for Phone Call Additional follow up Details #3:: Details for Additional Follow-up Action Taken: MAILED LETTER Additional Follow-up by: Lavell Islam,  August 25, 2010 9:43 AM  Prescriptions: XANAX 0.5 MG  TABS (ALPRAZOLAM) 2 by mouth at bedtime as needed insomnia  #60 x 0   Entered by:   [name] CMA   Authorized by:   Nolon Rod. Paz MD   Signed by:   [name] CMA on 08/22/2010   Method used:   Printed then faxed to ...       [facility] (retail)       [address]       [place], [place]  [address]       Ph: [phone]       Fax: [phone]   RxID:   [id number]

## 2011-01-23 NOTE — Progress Notes (Signed)
Summary: Refill Request  Phone Note Refill Request Call back at [phone] Message from:  Pharmacy on July 05, 2010 9:42 AM  Refills Requested: Medication #1:  CARDIZEM CD 120 MG XR24H-CAP take 1 tab once daily   Dosage confirmed as above?Dosage Confirmed   Supply Requested: 1 month   Last Refilled: 06/05/2010 [name]  Next Appointment Scheduled: Sept 21st 2011 Initial call taken by: Lavell Islam,  July 05, 2010 9:43 AM    Prescriptions: CARDIZEM CD 120 MG XR24H-CAP (DILTIAZEM HCL COATED BEADS) take 1 tab once daily  #30 x 0   Entered by:   [name] CMA   Authorized by:   Nolon Rod. Paz MD   Signed by:   [name] CMA on 07/05/2010   Method used:   Electronically to        [facility]* (retail)       [address]       [place], [place]  [address]       Ph: [phone]       Fax: [phone]   RxID:   [id number]

## 2011-01-23 NOTE — Progress Notes (Signed)
Summary: refill  Phone Note Refill Request Message from:  Fax from Pharmacy on October 03, 2010 8:19 AM  Refills Requested: Medication #1:  CLOTRIMAZOLE/BETAMET 1-0.05% topical crm #30 apply to abdominal area daiy [facility] fax [phone]  Initial call taken by: Okey Regal Spring,  October 03, 2010 8:24 AM  Follow-up for Phone Call        Was removed from pts med list, i left a message for pt to see why pt needed this medication? Okay to fill?  Follow-up by: [name] CMA,  October 03, 2010 8:33 AM  Additional Follow-up for Phone Call Additional follow up Details #1::        Pt states that he has a rash in the groin area- he said it went away using the cream and now it has come back. Okay to fill, or does pt need to come in?  Additional Follow-up by: [name] CMA,  October 03, 2010 8:43 AM    Additional Follow-up for Phone Call Additional follow up Details #2::    okay to refill x1 , if rash persists, he needs to be seen. sign   New/Updated Medications: CLOTRIMAZOLE-BETAMETHASONE 1-0.05 % CREA (CLOTRIMAZOLE-BETAMETHASONE) apply to abdominal area daiy-- if persists needs OV Prescriptions: CLOTRIMAZOLE-BETAMETHASONE 1-0.05 % CREA (CLOTRIMAZOLE-BETAMETHASONE) apply to abdominal area daiy-- if persists needs OV  #1 tube x 0   Entered by:   [name] CMA   Authorized by:   Nolon Rod. Paz MD   Signed by:   [name] CMA on 10/03/2010   Method used:   Electronically to        [facility] (retail)       [address]       [place], [place]  [address]       Ph: [phone]       Fax: [phone]   RxID:   [id number]

## 2011-01-23 NOTE — Assessment & Plan Note (Signed)
Summary: f1y/afib      Allergies Added: NKDA  Visit Type:  Follow-up Primary Provider:  Nolon Rod. Paz MD  CC:  irregular heart beat.  History of Present Illness: Warren [name] is seen in followup for atrial fibrillation. 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 1 By Mouth Once Daily 3)  Flecainide Acetate 150 Mg Tabs (Flecainide Acetate) .... Two Times A Day 4)  Hydrochlorothiazide 25 Mg Tabs (Hydrochlorothiazide) .Marland Kitchen.. 1 By Mouth Once Daily 5)  Klor-Con M20 20 Meq Cr-Tabs (Potassium Chloride Crys Cr) .... 3 By Mouth Once Daily 6)  Toprol Xl 200 Mg  Tb24 (Metoprolol Succinate) .... 1/2 By Mouth Qd 7)  Nitroglycerin 0.4 Mg  Subl (Nitroglycerin) .... One Sublingual Every 5 Minutes 3 Times P.r.n. Chest Pain 8)  One-Daily Multivitamins   Tabs (Multiple Vitamin) 9)  Xanax 0.5 Mg  Tabs (Alprazolam) .... 2 By Mouth At Bedtime As Needed Insomnia 10)  Omeprazole 40 Mg Cpdr (Omeprazole) .Marland Kitchen.. 1 By Mouth Once Daily 11)  Meloxicam 7.5 Mg Tabs (Meloxicam) .Marland Kitchen.. 1-2 By Mouth Once Daily 12)  Vicodin Hp 10-660 Mg Tabs (Hydrocodone-Acetaminophen) .... Two Times A Day 13)  Vitamin E 14)  Coq10 15)  Cod Liver Oil  Oil (Cod Liver Oil) .Marland Kitchen.. 1 By Mouth  Once Daily  Allergies (verified): No Known Drug Allergies  Past History:  Past Medical History: Last updated: 07/04/2009 Hypertension increased PSA Cardiac cath 2001: no CAD Supraventricular tachycardia in 2002.  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  He underwent radiofrequency ablation.   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  myoview 12-09: neg  02-2009: . Atrial fibrillation with rapid ventricular response      Past Surgical History: Last updated: 04/12/2009 Appendectomy Cardiac cath 2001: no CAD status post ablation in 2007.  myoview 12-09: neg  tonsillectomy   Vital Signs:  Patient profile:   65 year old male Height:      71 inches Weight:      272 pounds BMI:     38.07 Pulse rate:   58 / minute BP sitting:   128 / 84  (left arm) Cuff size:   large  Vitals Entered By: Laurance Flatten CMA (January 19, 2010 9:43 AM)  Physical Exam  General:  The patient was alert and oriented in no acute distress. He smells of cigarettes HEENT Normal.  Neck veins were flat, carotids were brisk.  Lungs were clear.  Heart sounds were regular without murmurs or gallops.  Abdomen was soft with active bowel sounds. There is no clubbing cyanosis or edema. Skin Warm and dry  EKG  Procedure date:  01/19/2010  Findings:      sinus rhythm at 58 Intervals 0.15/110/0.45 Otherwise normal  Impression & Recommendations:  Problem # 1:  ATRIAL FIBRILLATION (ICD-427.31) atrial fibrillation is currently quiet without ago recurrences on his flecainide. We will continue this. His updated medication list for this problem includes:    Bayer Aspirin 325 Mg Tabs (Aspirin)    Flecainide Acetate 150 Mg Tabs (Flecainide acetate) .Marland Kitchen..Marland Kitchen Two times a day    Toprol Xl 200 Mg Tb24 (Metoprolol succinate) .Marland Kitchen... 1/2 by  mouth qd  Orders: EKG w/ Interpretation (93000)  Problem # 2:  HYPERTENSION (ICD-401.9) blood pressure is well controlled. His laboratories most recently demonstrated potassium of 3.9. Renal function was normal His updated medication list for this problem includes:    Bayer Aspirin 325 Mg Tabs (Aspirin)    Cardizem 120 Mg Tabs (Diltiazem hcl) .Marland Kitchen... 1 by mouth once daily    Hydrochlorothiazide 25 Mg Tabs (Hydrochlorothiazide) .Marland Kitchen... 1 by mouth once daily    Toprol Xl 200 Mg Tb24 (Metoprolol succinate) .Marland Kitchen... 1/2 by mouth qd  Problem # 3:  OBESITY (ICD-278.00) we discussed again the importance of weight loss. We discussed diet as well as exercise. He is going to work harder on the latter and the former  Problem # 4:  TOBACCO ABUSE (ICD-305.1) we discussed stopping smoking. As noted previously he is not interested at this point in stopping  Patient Instructions: 1)  Your physician recommends that you schedule a follow-up appointment in: 12 MONTHS

## 2011-01-23 NOTE — Miscellaneous (Signed)
Summary: omeprazole approved  Letter from [employer]: "We reviewed your request to obtain Omeprazole Capsule for an additional quanity above the limit allowed by your plan.  as we informed your doctor, this request has been approved from 12/19/2010 until 07/08/2010".  Letter scanned into EMR [facility]  January 20, 2010 8:31 AM   Clinical Lists Changes

## 2011-01-23 NOTE — Progress Notes (Signed)
Summary: refill  Phone Note Refill Request Message from:  Fax from Pharmacy on September 25, 2010 11:46 AM  Refills Requested: Medication #1:  XANAX 0.5 MG  TABS 2 by mouth at bedtime as needed insomnia   Last Refilled: 08/22/2010 [facility] - fax [phone]  Initial call taken by: Okey Regal Spring,  September 25, 2010 11:50 AM  Follow-up for Phone Call        ok 60 and 6 RF Jose E. Paz MD  September 25, 2010 1:50 PM     Prescriptions: XANAX 0.5 MG  TABS (ALPRAZOLAM) 2 by mouth at bedtime as needed insomnia  #60 x 6   Entered by:   [name] CMA   Authorized by:   Nolon Rod. Paz MD   Signed by:   [name] CMA on 09/25/2010   Method used:   Printed then faxed to ...       [facility] (retail)       [address]       [place], [place]  [address]       Ph: [phone]       Fax: [phone]   RxID:   [id number]

## 2011-01-23 NOTE — Progress Notes (Signed)
Summary: Refill Request  Phone Note Refill Request Call back at [phone] Message from:  Pharmacy on May 30, 2010 8:55 AM  Refills Requested: Medication #1:  VICODIN HP 10-660 MG TABS two times a day   Dosage confirmed as above?Dosage Confirmed   Brand Name Necessary? No   Supply Requested: 1 month   Last Refilled: 04/13/2010 [facility]  Next Appointment Scheduled: 6.21.11 Initial call taken by: Harold Barban,  May 30, 2010 8:55 AM  Follow-up for Phone Call        last filled  11-30-09 #60 3 refills, last OV 12-13-09, pending OV 06-13-10.Felecia Deloach CMA  May 30, 2010 10:45 AM   ok 60 and 3 RF; due for OV, please remind the patient  Elita Quick E. Brendin Situ MD  May 30, 2010 5:21 PM   Additional Follow-up for Phone Call Additional follow up Details #1::        pt has pending OV 06-13-10...........Marland KitchenFelecia Deloach CMA  May 31, 2010 9:23 AM     Prescriptions: VICODIN HP 10-660 MG TABS (HYDROCODONE-ACETAMINOPHEN) two times a day  #60 x 3   Entered by:   Jeremy Johann CMA   Authorized by:   Nolon Rod. Legrande Hao MD   Signed by:   Jeremy Johann CMA on 05/31/2010   Method used:   Printed then faxed to ...       [facility] (retail)       [address]       [place], [place]  [address]       Ph: [phone]       Fax: [phone]   RxID:   [id number]

## 2011-01-23 NOTE — Progress Notes (Signed)
Summary: Refill Request  Phone Note Refill Request Call back at [phone] Message from:  Pharmacy on June 05, 2010 2:06 PM  Refills Requested: Medication #1:  CARDIZEM 120 MG TABS 1 by mouth once daily   Dosage confirmed as above?Dosage Confirmed   Supply Requested: 1 month [name]  Next Appointment Scheduled: June 21st 2011 Initial call taken by: Harold Barban,  June 05, 2010 2:07 PM    Prescriptions: CARDIZEM 120 MG TABS (DILTIAZEM HCL) 1 by mouth once daily  #90 x 0   Entered by:   Jeremy Johann CMA   Authorized by:   Nolon Rod. Paz MD   Signed by:   Jeremy Johann CMA on 06/06/2010   Method used:   Faxed to ...       [facility] (retail)       [address]       [place], [place]  [address]       Ph: [phone]       Fax: [phone]   RxID:   [id number]

## 2011-01-23 NOTE — Progress Notes (Signed)
Summary: DILTIAZEM HC REFILL  Phone Note Refill Request Message from:  Fax from Pharmacy on September 05, 2010 10:01 AM  Refills Requested: Medication #1:  CARDIZEM CD 120 MG XR24H-CAP take 1 tab once daily [facility],     [address],   FAX  [phone]    QTY = 30  Initial call taken by: Jerolyn Shin,  September 05, 2010 10:03 AM    Prescriptions: CARDIZEM CD 120 MG XR24H-CAP (DILTIAZEM HCL COATED BEADS) take 1 tab once daily  #30 x 2   Entered by:   [name] CMA   Authorized by:   Nolon Rod. Paz MD   Signed by:   [name] CMA on 09/05/2010   Method used:   Electronically to        [facility]* (retail)       [address]       [place], [place]  [address]       Ph: [phone]       Fax: [phone]   RxID:   [id number]

## 2011-01-23 NOTE — Medication Information (Signed)
Summary: Prior Authorization & Approval for Additional Quantity Omeprazol  Prior Authorization & Approval for Additional Quantity Omeprazole/[employer]   Imported By: Lanelle Bal 01/13/2010 12:46:01  _____________________________________________________________________  External Attachment:    Type:   Image     Comment:   External Document

## 2011-01-23 NOTE — Progress Notes (Signed)
Summary: Refill Request  Phone Note Refill Request Call back at [phone] Message from:  Pharmacy on July 05, 2010 8:35 AM  Refills Requested: Medication #1:  HYDROCHLOROTHIAZIDE 25 MG TABS 1 by mouth once daily   Dosage confirmed as above?Dosage Confirmed   Supply Requested: 3 months   Last Refilled: 04/10/2010  Medication #2:  KLOR-CON M20 20 MEQ CR-TABS 3 by mouth once daily   Dosage confirmed as above?Dosage Confirmed   Supply Requested: 3 months   Last Refilled: 06/05/2010 [name]  Next Appointment Scheduled: Sept 21st 2011 Initial call taken by: Lavell Islam,  July 05, 2010 8:35 AM    Prescriptions: KLOR-CON M20 20 MEQ CR-TABS (POTASSIUM CHLORIDE CRYS CR) 3 by mouth once daily  #270 x 1   Entered by:   [name] CMA   Authorized by:   Nolon Rod. Paz MD   Signed by:   [name] CMA on 07/05/2010   Method used:   Electronically to        [facility] (retail)       [address]       [place], [place]  [address]       Ph: [phone]       Fax: [phone]   RxID:   [id number] HYDROCHLOROTHIAZIDE 25 MG TABS (HYDROCHLOROTHIAZIDE) 1 by mouth once daily  #30 x 2   Entered by:   [name] CMA   Authorized by:   Nolon Rod. Paz MD   Signed by:   [name] CMA on 07/05/2010   Method used:   Electronically to        [facility] (retail)       [address]       [place], [place]  [address]       Ph: [phone]       Fax: [phone]   RxID:   [id number]

## 2011-01-23 NOTE — Progress Notes (Signed)
Summary: REFILL   Phone Note Refill Request Call back at [phone] Message from:  Pharmacy on October 18, 2010 8:58 AM  Refills Requested: Medication #1:  VICODIN HP 10-660 MG TABS two times a day   Dosage confirmed as above?Dosage Confirmed   Supply Requested: 1 month [name]  Next Appointment Scheduled: 12/14/10 Initial call taken by: Lavell Islam,  October 18, 2010 8:58 AM  Follow-up for Phone Call        ok  #60, one refill Follow-up by: [facility] E. Nivin Braniff MD,  October 18, 2010 9:30 AM    ThePrescriptions: VICODIN HP 10-660 MG TABS (HYDROCODONE-ACETAMINOPHEN) two times a day  #60 x 1   Entered by:   [name] CMA   Authorized by:   Nolon Rod. Runell Kovich MD   Signed by:   [name] CMA on 10/18/2010   Method used:   Printed then faxed to ...       [facility] (retail)       [address]       [place], [place]  [address]       Ph: [phone]       Fax: [phone]   RxID:   [id number]

## 2011-01-25 NOTE — Letter (Signed)
Summary: Unable To Reach-Consult Scheduled  [facility]  [address] [place], [place] [address]   Phone: [phone]  Fax: [phone]    12/27/2010 MRN: [id number]    Dear Mr. Frison,   We have been unable to reach you by phone.  Please contact our office with an updated phone number.     Thank you,  [name] CMA  December 27, 2010 8:38 AM

## 2011-01-25 NOTE — Letter (Signed)
Summary: [facility]: Immunoassay Fecal Occult Blood (iFOB) Order Form  [facility]  [address] [place], [place] [address]   Phone: [phone]  Fax: [phone]      [facility]: Immunoassay Fecal Occult Blood (iFOB) Order Form   December 19, 2010 MRN: [id number]   Warren [name] 09/11/1946   Physicican Name:____jose paz, md _____________________  Diagnosis Code:_____v76.51_____________________      [name] CMA

## 2011-01-25 NOTE — Progress Notes (Signed)
Summary: Refill Request  Phone Note Refill Request Call back at [phone] Message from:  Pharmacy on January 02, 2011 8:19 AM  Refills Requested: Medication #1:  CARDIZEM CD 120 MG XR24H-CAP take 1 tab once daily   Dosage confirmed as above?Dosage Confirmed   Brand Name Necessary? No   Supply Requested: 30   Last Refilled: 12/05/2010  Medication #2:  HYDROCHLOROTHIAZIDE 25 MG TABS 1 by mouth once daily   Dosage confirmed as above?Dosage Confirmed   Supply Requested: 100   Last Refilled: 10/03/2010 [facility]  Next Appointment Scheduled: 6.26.12 Initial call taken by: Harold Barban,  January 02, 2011 8:20 AM    Prescriptions: HYDROCHLOROTHIAZIDE 25 MG TABS (HYDROCHLOROTHIAZIDE) 1 by mouth once daily  #30 x 5   Entered by:   [name] CMA   Authorized by:   Nolon Rod. Paz MD   Signed by:   [name] CMA on 01/02/2011   Method used:   Electronically to        [facility] (retail)       [address]       [place], [place]  [address]       Ph: [phone]       Fax: [phone]   RxID:   [id number] CARDIZEM CD 120 MG XR24H-CAP (DILTIAZEM HCL COATED BEADS) take 1 tab once daily  #30 x 5   Entered by:   [name] CMA   Authorized by:   Nolon Rod. Paz MD   Signed by:   [name] CMA on 01/02/2011   Method used:   Electronically to        [facility] (retail)       [address]       [place], [place]  [address]       Ph: [phone]       Fax: [phone]   RxID:   [id number]

## 2011-01-25 NOTE — Progress Notes (Signed)
Summary: Refill Request  Phone Note Refill Request Call back at [phone] Message from:  Pharmacy on December 21, 2010 8:38 AM  Refills Requested: Medication #1:  NITROGLYCERIN 0.4 MG  SUBL one sublingual every 5 minutes 3 times p.r.n. chest pain   Dosage confirmed as above?Dosage Confirmed   Supply Requested: #25   Last Refilled: 08/22/2010 [facility]  Next Appointment Scheduled: 6.26.12 Initial call taken by: Harold Barban,  December 21, 2010 8:39 AM    Prescriptions: NITROGLYCERIN 0.4 MG  SUBL (NITROGLYCERIN) one sublingual every 5 minutes 3 times p.r.n. chest pain  #30 x 5   Entered by:   [name] CMA   Authorized by:   Nolon Rod. Paz MD   Signed by:   [name] CMA on 12/21/2010   Method used:   Electronically to        [facility] (retail)       [address]       [place], [place]  [address]       Ph: [phone]       Fax: [phone]   RxID:   [id number]

## 2011-01-25 NOTE — Assessment & Plan Note (Signed)
Summary: cpx & lab/cbs   Vital Signs:  Patient profile:   65 year old male Height:      71 inches Weight:      281.25 pounds BMI:     39.37 Pulse rate:   63 / minute Pulse rhythm:   regular BP sitting:   134 / 86  (left arm) Cuff size:   large  Vitals Entered By: [name] CMA (December 14, 2010 9:24 AM) CC: CPX, fasting  Comments c/o possible rash on abdomen using Zeasorb Discuss pneumovas does not want colonscopy [name]   History of Present Illness: Here for Medicare AWV:  1.   Risk factors based on Past M, S, F history: recviewed  2.   Physical Activities: no routine exercises  3.   Depression/mood: no major issues  4.   Hearing: no problems reported or noted  5.   ADL's: independent  6.   Fall Risk: low risk, no recent falls  7.   Home Safety: does feel safe at home  8.   Height, weight, &visual acuity: gained wt ; uses glasses , due for eye check up, encouraged to                schedule  9.   Counseling: provided, see below  10.   Labs ordered based on risk factors: yes 11.           Referral Coordination-- if needed  12.           Care Plan-- see a/p  13.            Cognitive Assessment: cognition, motor skills and memory seem normal  in addition, we discussed the following rash-- see a/p  Hypertension-- no ambulatory BPs , good medication compliance  increased PSA-- decided not go to urology !  Atrial fibrillation --  due for OV w/ cards in January, asx, no palpitations and good medication compliance     Preventive Screening-Counseling & Management  Caffeine-Diet-Exercise     Does Patient Exercise: yes     Type of exercise: walking  Current Medications (verified): 1)  Bayer Aspirin 325 Mg  Tabs (Aspirin) 2)  Cardizem Cd 120 Mg Xr24h-Cap (Diltiazem Hcl Coated Beads) .... Take 1 Tab Once Daily 3)  Flecainide Acetate 150 Mg Tabs (Flecainide Acetate) .... Two Times A Day 4)  Hydrochlorothiazide 25 Mg Tabs (Hydrochlorothiazide) .Marland Kitchen.. 1 By  Mouth Once Daily 5)  Klor-Con M20 20 Meq Cr-Tabs (Potassium Chloride Crys Cr) .... 3 By Mouth Once Daily 6)  Toprol Xl 200 Mg  Tb24 (Metoprolol Succinate) .... 1/2 By Mouth Qd 7)  Nitroglycerin 0.4 Mg  Subl (Nitroglycerin) .... One Sublingual Every 5 Minutes 3 Times P.r.n. Chest Pain 8)  One-Daily Multivitamins   Tabs (Multiple Vitamin) 9)  Xanax 0.5 Mg  Tabs (Alprazolam) .... 2 By Mouth At Bedtime As Needed Insomnia 10)  Omeprazole 40 Mg Cpdr (Omeprazole) .Marland Kitchen.. 1 By Mouth Once Daily 11)  Vicodin Hp 10-660 Mg Tabs (Hydrocodone-Acetaminophen) .... Two Times A Day 12)  Vitamin E 13)  Coq10 14)  Cod Liver Oil  Oil (Cod Liver Oil) .Marland Kitchen.. 1 By Mouth Once Daily  Allergies (verified): No Known Drug Allergies  Past History:  Past Medical History: Reviewed history from 09/13/2010 and no changes required. Hypertension increased PSA Cardiac cath 2001: no CAD Supraventricular tachycardia in 2002.  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  He underwent radiofrequency ablation.   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  myoview 12-09: neg  02-2009: . Atrial fibrillation with rapid ventricular response      Past Surgical History: Reviewed history from 04/12/2009 and no changes required. Appendectomy Cardiac cath 2001: no CAD status post ablation in 2007.  myoview 12-09: neg  tonsillectomy   Family History: Reviewed history from 04/12/2009 and no changes required. adopted  Social History: plays at the [facility] often and stays up all night twice a week Single, lives by self  no kids tobacco-- yes , 2 "large" cigars daily  ETOH-- rarely  Does Patient Exercise:  yes  Review of Systems General:  Denies fatigue and weight loss. CV:  Denies chest pain or discomfort and swelling of feet. Resp:  Denies cough and  coughing up blood; occasionally chest congestion "from cough". GI:  Denies bloody stools, diarrhea, nausea, and vomiting. GU:  Denies hematuria, urinary frequency, and urinary hesitancy.  Physical Exam  General:  alert, well-developed, and overweight-appearing.   Neck:  no masses and no thyromegaly.   Lungs:  normal respiratory effort, no intercostal retractions, no accessory muscle use, and normal breath sounds.   Heart:  normal rate, regular rhythm, and no murmur.   Abdomen:  soft, non-tender, no distention, no masses, no guarding, and no rigidity.   Rectal:  No external abnormalities noted. Normal sphincter tone. No rectal masses or tenderness. hemocult neg  Prostate:  Prostate gland firm and smooth, no enlargement, nodularity, tenderness, mass, asymmetry or induration. Extremities:  no pretibial edema bilaterally  Psych:  Oriented X3, memory intact for recent and remote, normally interactive, good eye contact, not anxious appearing, and not depressed appearing.     Impression & Recommendations:  Problem # 1:  HEALTH SCREENING (ICD-V70.0)  Td 09 pneumonia shot--- today  had a flu shot    shingles immunization--discussed  Cscope --never did not do an IFOB last year "I don't do Cscopes" explained benefits of Cscope, IFOB provided   counseling: diet, exercise smoker: d/w patient his high risk for CAD-Stroke-oral cancer, rec d/c tobacco, see the dentist!    Orders: Medicare -1st Annual Wellness Visit [phone])  Problem # 2:  HYPERLIPIDEMIA (ICD-272.4) no medication, diet poor, gaining weight----> counseled  Labs Reviewed: SGOT: 29 (12/13/2009)   SGPT: 43 (12/13/2009)  Prior 10 Yr Risk Heart Disease: 40 % (04/13/2009)   HDL:32.40 (12/13/2009), 30.9 (10/12/2008)  LDL:121 (12/13/2009), 114 (10/12/2008)  Chol:179 (12/13/2009), 170 (10/12/2008)  Trig:130.0 (12/13/2009), 126 (10/12/2008)  Orders: TLB-ALT (SGPT) (84460-ALT) TLB-AST (SGOT) (84450-SGOT) TLB-Lipid Panel  (80061-LIPID) Specimen Handling (56213)  Problem # 3:  ATRIAL FIBRILLATION (ICD-427.31) due to see cardiology  next month, labs His updated medication list for this problem includes:    Bayer Aspirin 325 Mg Tabs (Aspirin)    Cardizem Cd 120 Mg Xr24h-cap (Diltiazem hcl coated beads) .Marland Kitchen... Take 1 tab once daily    Flecainide Acetate 150 Mg Tabs (Flecainide acetate) .Marland Kitchen..Marland Kitchen Two times a day    Toprol Xl 200 Mg Tb24 (Metoprolol succinate) .Marland Kitchen... 1/2 by mouth qd  Orders: TLB-T4 (Thyrox), Free [phone]) TLB-TSH (Thyroid Stimulating Hormone) (84443-TSH) TLB-T3, Free (Triiodothyronine) (84481-T3FREE) Specimen Handling (62952)  Problem # 4:  PSA, INCREASED (ICD-790.93)  DRE normal today Recheck a PSA If is still increased, I told him he needs to urology  Orders: TLB-PSA (Prostate Specific Antigen) (84153-PSA)  Problem # 5:  HYPERTENSION (ICD-401.9) at goal  His updated medication list for this problem includes:  Cardizem Cd 120 Mg Xr24h-cap (Diltiazem hcl coated beads) .Marland Kitchen... Take 1 tab once daily    Hydrochlorothiazide 25 Mg Tabs (Hydrochlorothiazide) .Marland Kitchen... 1 by mouth once daily    Toprol Xl 200 Mg Tb24 (Metoprolol succinate) .Marland Kitchen... 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 Take 1 tab once daily 3)   Flecainide Acetate 150 Mg Tabs (Flecainide acetate) .... Two times a day 4)  Hydrochlorothiazide 25 Mg Tabs (Hydrochlorothiazide) .Marland Kitchen.. 1 by mouth once daily 5)  Klor-con M20 20 Meq Cr-tabs (Potassium chloride crys cr) .... 3 by mouth once daily 6)  Toprol Xl 200 Mg Tb24 (Metoprolol succinate) .... 1/2 by mouth qd 7)  Nitroglycerin 0.4 Mg Subl (Nitroglycerin) .... One sublingual every 5 minutes 3 times p.r.n. chest pain 8)  One-daily Multivitamins Tabs (Multiple vitamin) 9)  Xanax 0.5 Mg Tabs (Alprazolam) .... 2 by mouth at bedtime as needed insomnia 10)  Omeprazole 40 Mg Cpdr (Omeprazole) .Marland Kitchen.. 1 by mouth once daily 11)  Vicodin Hp 10-660 Mg Tabs (Hydrocodone-acetaminophen) .... Two times a day 12)  Vitamin E  13)  Coq10  14)  Cod Liver Oil Oil (Cod liver oil) .Marland Kitchen.. 1 by mouth once daily  Other Orders: Pneumococcal Vaccine (09811) Admin 1st Vaccine (91478)  Patient Instructions: 1)  Please schedule a follow-up appointment in 6 months .    Orders Added: 1)  Venipuncture [36415] 2)  TLB-BMP (Basic Metabolic Panel-BMET) [80048-METABOL] 3)  TLB-CBC Platelet - w/Differential [85025-CBCD] 4)  TLB-ALT (SGPT) [84460-ALT] 5)  TLB-AST (SGOT) [84450-SGOT] 6)  TLB-T4 (Thyrox), Free [29562-ZH0Q] 7)  TLB-TSH (Thyroid Stimulating Hormone) [84443-TSH] 8)  TLB-T3, Free (Triiodothyronine) [65784-O9GEXB] 9)  TLB-PSA (Prostate Specific Antigen) [28413-KGM] 10)  TLB-Lipid Panel [80061-LIPID] 11)  Pneumococcal Vaccine [90732] 12)  Admin 1st Vaccine [90471] 13)  Specimen Handling [99000] 14)  Est. Patient Level III [01027] 15)  Medicare -1st Annual Wellness Visit [G0438]   Immunizations Administered:  Pneumonia Vaccine:    Vaccine Type: Pneumovax    Site: right deltoid    Mfr: Merck    Dose: 0.5 ml    Route: IM    Given by: [name] CMA    Exp. Date: 04/24/2012    Lot #: [id number]   Immunizations Administered:  Pneumonia Vaccine:    Vaccine Type: Pneumovax    Site: right deltoid     Mfr: Merck    Dose: 0.5 ml    Route: IM    Given by: [name] CMA    Exp. Date: 04/24/2012    Lot #: [id number]   Risk Factors:  Exercise:  yes    Type:  walking

## 2011-01-25 NOTE — Progress Notes (Signed)
Summary: FLECAINIDE   Phone Note Refill Request Message from:  Patient on January 11, 2011 8:22 AM  Refills Requested: Medication #1:  FLECAINIDE ACETATE 150 MG TABS two times a day [facility]   Method Requested: Telephone to Pharmacy Initial call taken by: Glynda Jaeger,  January 11, 2011 8:22 AM               Prescriptions: FLECAINIDE ACETATE 150 MG TABS (FLECAINIDE ACETATE) two times a day  #60 x 2   Entered by:   Caralee Ates CMA   Authorized by:   Nathen May, MD, [facility]   Signed by:   Caralee Ates CMA on 01/11/2011   Method used:   Electronically to        [facility] (retail)       [address]       [place], [place]  [id number]       Ph: [phone]       Fax: [phone]   RxID:   [id number]

## 2011-01-25 NOTE — Progress Notes (Signed)
Summary: Pain med refill  Phone Note Refill Request Message from:  Pharmacy on January 03, 2011 10:36 AM  Refills Requested: Medication #1:  VICODIN HP 10-660 MG TABS two times a day   Last Refilled: 11/17/2010 last seen 12/22 for CPX  Next Appointment Scheduled: 06.26.12 Initial call taken by: Lucious Groves CMA,  January 03, 2011 10:36 AM  Follow-up for Phone Call        60, 1 RF Follow-up by: Elita Quick E. Almando Brawley MD,  January 03, 2011 5:49 PM    Prescriptions: VICODIN HP 10-660 MG TABS (HYDROCODONE-ACETAMINOPHEN) two times a day  #60 x 1   Entered by:   Lucious Groves CMA   Authorized by:   Nolon Rod. Jillane Po MD   Signed by:   Lucious Groves CMA on 01/04/2011   Method used:   Printed then faxed to ...       [facility] (retail)       [address]       [place], [place]  [address]       Ph: [phone]       Fax: [phone]   RxID:   [id number]

## 2011-01-25 NOTE — Progress Notes (Signed)
Summary: Refill Request  Phone Note Refill Request Call back at [phone] Message from:  Pharmacy on December 19, 2010 8:22 AM  Refills Requested: Medication #1:  KLOR-CON M20 20 MEQ CR-TABS 3 by mouth once daily   Dosage confirmed as above?Dosage Confirmed   Supply Requested: #270   Last Refilled: 07/05/2010 [facility]  Next Appointment Scheduled: 6.26.12 Initial call taken by: Harold Barban,  December 19, 2010 8:22 AM    Prescriptions: KLOR-CON M20 20 MEQ CR-TABS (POTASSIUM CHLORIDE CRYS CR) 3 by mouth once daily  #270 x 1   Entered by:   [name] CMA   Authorized by:   Nolon Rod. Paz MD   Signed by:   [name] CMA on 12/19/2010   Method used:   Electronically to        [facility] (retail)       [address]       [place], [place]  [address]       Ph: [phone]       Fax: [phone]   RxID:   [id number]

## 2011-01-25 NOTE — Progress Notes (Signed)
Summary: refill  Phone Note Refill Request Message from:  Fax from Pharmacy on December 05, 2010 9:54 AM  Refills Requested: Medication #1:  CARDIZEM CD 120 MG XR24H-CAP take 1 tab once daily [facility] - fax [phone]  Initial call taken by: Okey Regal Spring,  December 05, 2010 9:55 AM    Prescriptions: CARDIZEM CD 120 MG XR24H-CAP (DILTIAZEM HCL COATED BEADS) take 1 tab once daily  #30 x 0   Entered by:   [name] CMA   Authorized by:   Nolon Rod. Paz MD   Signed by:   [name] CMA on 12/05/2010   Method used:   Electronically to        [facility]* (retail)       [address]       [place], [place]  [address]       Ph: [phone]       Fax: [phone]   RxID:   [id number]

## 2011-01-31 ENCOUNTER — Encounter (INDEPENDENT_AMBULATORY_CARE_PROVIDER_SITE_OTHER): Payer: Self-pay | Admitting: *Deleted

## 2011-01-31 ENCOUNTER — Telehealth (INDEPENDENT_AMBULATORY_CARE_PROVIDER_SITE_OTHER): Payer: Self-pay | Admitting: *Deleted

## 2011-01-31 ENCOUNTER — Other Ambulatory Visit: Payer: Self-pay

## 2011-02-06 ENCOUNTER — Other Ambulatory Visit: Payer: Self-pay

## 2011-02-06 ENCOUNTER — Encounter (INDEPENDENT_AMBULATORY_CARE_PROVIDER_SITE_OTHER): Payer: Self-pay | Admitting: *Deleted

## 2011-02-08 NOTE — Progress Notes (Signed)
Summary: refill  Phone Note Refill Request Message from:  Fax from Pharmacy on January 31, 2011 8:49 AM  Refills Requested: Medication #1:  TOPROL XL 200 MG  TB24 1/2 by mouth qd [facility] - fax [phone] - phone [phone]  Initial call taken by: Okey Regal Spring,  January 31, 2011 8:50 AM    Prescriptions: TOPROL XL 200 MG  TB24 (METOPROLOL SUCCINATE) 1/2 by mouth qd  #45 x 2   Entered by:   [name] CMA   Authorized by:   Nolon Rod. Paz MD   Signed by:   [name] CMA on 01/31/2011   Method used:   Electronically to        [facility] (retail)       [address]       [place], [place]  [address]       Ph: [phone]       Fax: [phone]   RxID:   [id number]

## 2011-02-14 ENCOUNTER — Ambulatory Visit (INDEPENDENT_AMBULATORY_CARE_PROVIDER_SITE_OTHER): Payer: BLUE CROSS/BLUE SHIELD | Admitting: Internal Medicine

## 2011-02-14 ENCOUNTER — Encounter: Payer: Self-pay | Admitting: Internal Medicine

## 2011-02-14 DIAGNOSIS — I4891 Unspecified atrial fibrillation: Secondary | ICD-10-CM

## 2011-02-14 DIAGNOSIS — I10 Essential (primary) hypertension: Secondary | ICD-10-CM

## 2011-02-20 NOTE — Assessment & Plan Note (Signed)
Summary: yearly      Allergies Added: NKDA  Visit Type:  1 follow up Primary Provider:  Nolon Rod. Paz MD  CC:  no complaints.  History of Present Illness: Warren [name] is seen in followup for atrial fibrillation. He has hypertension and takes flecainide Cardizem and aspirin  He has palpitations every month or 2. They typically last about 5-15 minutes.   He is walking 2 1/2 miles a day and is shooting for 3 miles daily  he struggles with insomnia I don't think he has sleep apnea He continues to smoke his cigars  and is not itneersted in stopping  Current Medications (verified): 1)  Bayer Aspirin 325 Mg  Tabs (Aspirin) 2)  Cardizem Cd 120 Mg Xr24h-Cap (Diltiazem Hcl Coated Beads) .... Take 1 Tab Once Daily 3)  Flecainide Acetate 150 Mg Tabs (Flecainide Acetate) .... Two Times A Day 4)  Hydrochlorothiazide 25 Mg Tabs (Hydrochlorothiazide) .Marland Kitchen.. 1 By Mouth Once Daily 5)  Klor-Con M20 20 Meq Cr-Tabs (Potassium Chloride Crys Cr) .... 3 By Mouth Once Daily 6)  Toprol Xl 200 Mg  Tb24 (Metoprolol Succinate) .... 1/2 By Mouth Qd 7)  Nitroglycerin 0.4 Mg  Subl (Nitroglycerin) .... One Sublingual Every 5 Minutes 3 Times P.r.n. Chest Pain 8)  One-Daily Multivitamins   Tabs (Multiple Vitamin) 9)  Xanax 0.5 Mg  Tabs (Alprazolam) .... 2 By Mouth At Bedtime As Needed Insomnia 10)  Omeprazole 40 Mg Cpdr (Omeprazole) .Marland Kitchen.. 1 By Mouth Once Daily 11)  Vicodin Hp 10-660 Mg Tabs (Hydrocodone-Acetaminophen) .... Two Times A Day 12)  Vitamin E 13)  Coq10 14)  Cod Liver Oil  Oil (Cod Liver Oil) .Marland Kitchen.. 1 By Mouth Once Daily  Allergies (verified): No Known Drug Allergies  Vital Signs:  Patient profile:   65 year old male Height:      71 inches Weight:      285.56 pounds BMI:     39.97 Pulse rate:   52 / minute BP sitting:   144 / 95  Vitals Entered By: Warren [name] CMA (February 14, 2011 4:40 PM)  Physical Exam  General:  The patient was alert and oriented in no acute distress. HEENT Normal.  Neck  veins were flat, carotids were brisk.  Lungs were clear.  Heart sounds were regular without murmurs or gallops.  Abdomen was soft with active bowel sounds. There is no clubbing cyanosis or edema. Skin Warm and dry    EKG  Procedure date:  02/14/2011  Findings:      sinus rhythm at 52 Intervals 0.21/0.10/0.48 Axis is 60  Impression & Recommendations:  Problem # 1:  ATRIAL FIBRILLATION (ICD-427.31)  His updated medication list for this problem includes:    Bayer Aspirin 325 Mg Tabs (Aspirin)    Flecainide Acetate 150 Mg Tabs (Flecainide acetate) .Marland Kitchen..Marland Kitchen Two times a day    Toprol Xl 200 Mg Tb24 (Metoprolol succinate) .Marland Kitchen... 1/2 by mouth qd  Orders: EKG w/ Interpretation (93000)  Problem # 2:  HYPERTENSION (ICD-401.9)  bpressure is modestly elevated today. His weight is also up. We had a long discussion about the importance of exercise.  His updated medication list for this problem includes:    Bayer Aspirin 325 Mg Tabs (Aspirin)    Cardizem Cd 120 Mg Xr24h-cap (Diltiazem hcl coated beads) .Marland Kitchen... Take 1 tab once daily    Hydrochlorothiazide 25 Mg Tabs (Hydrochlorothiazide) .Marland Kitchen... 1 by mouth once daily    Toprol Xl 200 Mg Tb24 (Metoprolol succinate) .Marland Kitchen... 1/2 by mouth  qd  Problem # 3:  OBESITY (ICD-278.00) the patient will continue his efforts to exercise and reduce his weight. His hypertension will be benefited  Problem # 4:  TOBACCO ABUSE (ICD-305.1) He continues to smoke. He says he is too much invested in his cigars.  Patient Instructions: 1)  Your physician has recommended you make the following change in your medication:  2)  Your physician wants you to follow-up in: YEAR WITH DR Logan Bores will receive a reminder letter in the mail two months in advance. If you don't receive a letter, please call our office to schedule the follow-up appointment.

## 2011-03-12 ENCOUNTER — Telehealth: Payer: Self-pay | Admitting: Internal Medicine

## 2011-03-14 ENCOUNTER — Other Ambulatory Visit: Payer: Self-pay | Admitting: *Deleted

## 2011-03-14 ENCOUNTER — Telehealth: Payer: Self-pay | Admitting: Internal Medicine

## 2011-03-14 DIAGNOSIS — I4891 Unspecified atrial fibrillation: Secondary | ICD-10-CM

## 2011-03-14 MED ORDER — FLECAINIDE ACETATE 150 MG PO TABS: 150.0000 mg | ORAL_TABLET | Freq: Two times a day (BID) | ORAL | Status: DC

## 2011-03-14 NOTE — Telephone Encounter (Signed)
Pt  Aware med sent via electronically.

## 2011-03-14 NOTE — Telephone Encounter (Signed)
Pt Needs Refill on Flecanide 150mg ,sent to Message Nurse/KM

## 2011-03-15 ENCOUNTER — Other Ambulatory Visit: Payer: Self-pay | Admitting: *Deleted

## 2011-03-15 DIAGNOSIS — I4891 Unspecified atrial fibrillation: Secondary | ICD-10-CM

## 2011-03-15 MED ORDER — FLECAINIDE ACETATE 150 MG PO TABS: 150.0000 mg | ORAL_TABLET | Freq: Two times a day (BID) | ORAL | Status: DC

## 2011-03-22 NOTE — Progress Notes (Signed)
Summary: refill  Phone Note Refill Request Message from:  Fax from Pharmacy on March 12, 2011 4:53 PM  Refills Requested: Medication #1:  VICODIN HP 10-660 MG TABS two times a day [facility] - fax [phone]  Initial call taken by: Okey Regal Spring,  March 12, 2011 4:53 PM  Follow-up for Phone Call        60, 1 Rf Warren [name] E. [name] MD  March 12, 2011 5:13 PM     Prescriptions: VICODIN HP 10-660 MG TABS (HYDROCODONE-ACETAMINOPHEN) two times a day  #60 x 1   Entered by:   [name] CMA   Authorized by:   Nolon Rod. [name] MD   Signed by:   [name] CMA on 03/12/2011   Method used:   Printed then faxed to ...       [facility] (retail)       [address]       [place], [place]  [id number]       Ph: [phone]       Fax: [phone]   RxID:   [id number]

## 2011-04-05 LAB — URINALYSIS, ROUTINE W REFLEX MICROSCOPIC
Ketones, ur: 15 mg/dL — AB
Nitrite: NEGATIVE
Specific Gravity, Urine: 1.016 (ref 1.005–1.030)
pH: 5 (ref 5.0–8.0)

## 2011-04-05 LAB — POCT CARDIAC MARKERS
CKMB, poc: 1.2 ng/mL (ref 1.0–8.0)
Myoglobin, poc: 81.3 ng/mL (ref 12–200)
Troponin i, poc: <0.05 ng/mL (ref 0.00–0.09)

## 2011-04-05 LAB — HEPARIN LEVEL (UNFRACTIONATED): Heparin Unfractionated: 0.25 IU/mL — ABNORMAL LOW (ref 0.30–0.70)

## 2011-04-05 LAB — DIFFERENTIAL
Basophils Absolute: 0.1 10*3/uL (ref 0.0–0.1)
Basophils Relative: 1 % (ref 0–1)
Eosinophils Absolute: 0.2 10*3/uL (ref 0.0–0.7)
Eosinophils Relative: 2 % (ref 0–5)
Monocytes Absolute: 0.8 10*3/uL (ref 0.1–1.0)
Neutro Abs: 6 10*3/uL (ref 1.7–7.7)

## 2011-04-05 LAB — CBC
HCT: 43.3 % (ref 39.0–52.0)
HCT: 46 % (ref 39.0–52.0)
Hemoglobin: 15.1 g/dL (ref 13.0–17.0)
Hemoglobin: 15.9 g/dL (ref 13.0–17.0)
MCV: 88.5 fL (ref 78.0–100.0)
Platelets: 176 10*3/uL (ref 150–400)
RDW: 14.2 % (ref 11.5–15.5)
WBC: 10.1 10*3/uL (ref 4.0–10.5)

## 2011-04-05 LAB — COMPREHENSIVE METABOLIC PANEL
ALT: 38 U/L (ref 0–53)
Albumin: 3.7 g/dL (ref 3.5–5.2)
Alkaline Phosphatase: 53 U/L (ref 39–117)
BUN: 16 mg/dL (ref 6–23)
Chloride: 108 mEq/L (ref 96–112)
Potassium: 3.3 mEq/L — ABNORMAL LOW (ref 3.5–5.1)
Sodium: 141 mEq/L (ref 135–145)
Total Bilirubin: 0.6 mg/dL (ref 0.3–1.2)

## 2011-04-05 LAB — BASIC METABOLIC PANEL
Calcium: 8.8 mg/dL (ref 8.4–10.5)
Chloride: 104 mEq/L (ref 96–112)
Creatinine, Ser: 1.13 mg/dL (ref 0.4–1.5)
GFR calc Af Amer: >60 mL/min (ref >60)

## 2011-04-05 LAB — BRAIN NATRIURETIC PEPTIDE: Pro B Natriuretic peptide (BNP): 194 pg/mL — ABNORMAL HIGH (ref 0.0–100.0)

## 2011-04-05 LAB — TSH: TSH: 7.527 u[IU]/mL — ABNORMAL HIGH (ref 0.350–4.500)

## 2011-04-05 LAB — HEMOCCULT GUIAC POC 1CARD (OFFICE): Fecal Occult Bld: NEGATIVE

## 2011-04-05 LAB — LIPID PANEL
LDL Cholesterol: 91 mg/dL (ref 0–99)
VLDL: 20 mg/dL (ref 0–40)

## 2011-04-05 LAB — PROTIME-INR: Prothrombin Time: 13.3 seconds (ref 11.6–15.2)

## 2011-04-05 LAB — T4, FREE: Free T4: 1.04 ng/dL (ref 0.89–1.80)

## 2011-04-05 LAB — T3, FREE: T3, Free: 3 pg/mL (ref 2.3–4.2)

## 2011-04-23 ENCOUNTER — Other Ambulatory Visit: Payer: Self-pay | Admitting: *Deleted

## 2011-04-23 NOTE — Telephone Encounter (Signed)
Please advise on Xanax . 

## 2011-04-23 NOTE — Telephone Encounter (Signed)
Ok Xanax #60 and 6 RF ok omeprazole, #30 and 6RF

## 2011-04-24 MED ORDER — OMEPRAZOLE 40 MG PO CPDR: 40.0000 mg | DELAYED_RELEASE_CAPSULE | Freq: Every day | ORAL | Status: DC

## 2011-04-24 MED ORDER — ALPRAZOLAM 0.5 MG PO TABS: ORAL_TABLET | ORAL | Status: DC

## 2011-05-08 NOTE — Discharge Summary (Signed)
Warren [name], Warren [name] NO.:  [id number]   MEDICAL RECORD NO.:  [id number]          PATIENT TYPE:  INP   LOCATION:  3707                         FACILITY:  [facility]   PHYSICIAN:  Rollene Rotunda, MD, FACCDATE OF BIRTH:  1946/10/22   DATE OF ADMISSION:  03/15/2009  DATE OF DISCHARGE:  03/17/2009                               DISCHARGE SUMMARY   PRIMARY CARDIOLOGIST:  Theron Arista C. Eden Emms, MD, [facility]   PRIMARY CARE PHYSICIAN:  Not listed.   ELECTROPHYSIOLOGIST:  Duke Salvia, MD, [facility]   PROCEDURES PERFORMED DURING HOSPITALIZATION:  Echocardiogram.   FINAL DISCHARGE DIAGNOSES:  1. Atrial fibrillation with rapid ventricular response.  2. Hypertension.  3. History of supraventricular tachycardia in 2002 with      electrophysiology study revealing concealed left lateral accessory      pathway that was conducting orthodromic atrioventricular reentrant      tachycardia, status post radiofrequency ablation.  4. Hyperlipidemia.  5. Tobacco abuse.  6. Chronic back pain.  7. History of Bell palsy.  8. 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   HOSPITAL COURSE:  This is a 65 year old obese Caucasian male with  history significant for AV reentrant tachycardia status post  radiofrequency ablation, who presented with atrial fibrillation with RVR  to [facility] Emergency Room on March 15, 2009.  In the emergency  department, the patient was started on diltiazem drip because the  ventricular rate was approximately 120 beats per minute, bringing his  heart rate down to the 80s.  The patient's blood pressure tolerated this  without difficulty.  The patient was admitted to have further  evaluation.  It was found that the patient was mildly hypokalemic with a  potassium of 3.3.  He was given potassium replacement.  The patient's  blood pressure was mildly hypotensive at that time.  His Norvasc was  discontinued secondary to  the AFib RVR and Cardizem drip able to manage  his heart rate.  The patient was started on p.o. Cardizem 120 mg daily.  He was also started on flecainide 120 mg b.i.d.  His Toprol was  continued as he was taking at home.   The patient was ordered an echocardiogram during this hospitalization.  The results are pending at time of dictation, but it was completed on  March 17, 2009.  It was also noted the patient had an elevated TSH of  7.527.  T3 and T4 have been ordered.  Please review these during the  followup visit.   On day of discharge, the patient was seen and examined by Dr. Rollene Rotunda and found to be stable.  The patient's potassium was 3.7 this  morning, creatinine was 1.13.  Vital signs were stable.  The patient  will be discharged home on current medications.  He will hold his  Norvasc and will continue potassium as an outpatient.  We will review  his T3 and T4 results on followup appointment.  On followup appointment,  the patient will have an exercise stress test and be seen by Dr. Eden Emms  the same day.   DISCHARGE LABORATORY DATA:  Sodium 140, potassium 3.7, chloride 109, CO2  of 30, BUN 10, and creatinine 1.13.  Hemoglobin A1c 5.3.  BNP 194.  TSH  7.527.  Hemoglobin 15.1, hematocrit 43.3, white blood cells 8.2, and  platelets 165.  Cholesterol 141, triglycerides 98, HDL 30, and LDL 91.  Chest x-ray dated March 15, 2009, was stable with no active lung  disease.  EKG on discharge revealing normal sinus rhythm with a  ventricular rate of 68 beats per minute.   DISCHARGE MEDICATIONS:  1. Metoprolol 100 mg daily.  2. HCTZ 25 mg daily.  3. Potassium 40 mEq daily.  4. Protonix 40 mg daily.  5. Aspirin 325 mg daily.  6. Flecainide 150 mg b.i.d. (new prescription provided).  7. Cardizem 120 mg daily (new prescription provided).  8. Xanax 1 mg p.o. at bedtime.   ALLERGIES:  No known drug allergies.   FOLLOWUP PLANS AND APPOINTMENTS:  1. The patient will be scheduled  for stress test non-nuclear on April 01, 2009, at 10:30 a.m. with a followup appointment with Dr. Eden Emms      at 11:15.  2. The patient has been given prescriptions for potassium, flecainide,      and Cardizem.  3. The patient has been advised to stop taking Norvasc.  4. The patient has been advised to hold metoprolol the day of stress      test.  5. Echocardiogram will need to be reviewed on the followup appointment      as the results are not available at the time of dictation.  6. T3 and T4 should be evaluated as results are not available at the      time of dictation secondary to elevated TSH.   Time spent with the patient to include physician time 35 minutes.       Bettey Mare. Lyman Bishop, NP      Rollene Rotunda, MD, [facility]  Electronically Signed    KML/MEDQ  D:  03/17/2009  T:  03/18/2009  Job:  [id number]

## 2011-05-08 NOTE — Discharge Summary (Signed)
[name], Warren [name] NO.:  [id number]   MEDICAL RECORD NO.:  [id number]          PATIENT TYPE:  INP   LOCATION:  3707                         FACILITY:  [facility]   PHYSICIAN:  Noralyn Pick. Eden Emms, MD, FACCDATE OF BIRTH:  September 23, 1946   DATE OF ADMISSION:  03/15/2009  DATE OF DISCHARGE:  03/17/2009                               DISCHARGE SUMMARY   ADDENDUM   This is an addendum basically to a discharge summary dictated on March  25.  It concerns potassium dosing.   The patient called our office today with request to resolve a  discrepancy in his potassium dosing.  It turns out he came in on the  23rd, and his potassium was 3.3.  He was on hydrochlorothiazide 25 mg  daily and potassium chloride 40 mEq daily.  To reboot his potassium, he  was given 80 mEq and then started on an increased dose for maintenance  of 60 mEq; however, his discharge summary noted that he will go home on  40 mEq and he called to resolve this discrepancy.  I mentioned that  since he had actually had a hypokalemic state in the setting of atrial  arrhythmia on 40 mEq only as supplement for his hydrochlorothiazide that  60 mEq daily, a new dose would make sense.  So, I have told him by phone  that after a long discussion that 60 mEq or three 20-mEq tablets are  what he should take.  He also mentioned that his pharmacy was out of  Cardizem and that he would have to skip a dose today. I said, Well,  that is just as may be and start as soon as possible.  He said he was  slightly out of rhythm as he felt but not rapid.  He is also on  flecainide as per the discharge summary 150 mg b.i.d. started this  admission.  He does not require any additional medication.  He said he  has enough for 60 mEq daily.  The point I am making is that I have asked  for a basic metabolic panel to check his potassium when he comes on  March 31, for a office visit with Dr. Drue Novel.  Once again, his admission  complete comprehensive  panel was sodium 141, potassium 3.3, chloride  108, carbonate 23, glucose 102, BUN is 16, creatinine is 1.25.  On the  day of discharge, his sodium was 140, potassium 3.7 not even above 4 as  we like with the atrial arrhythmias, chloride is 104, carbonate 30,  glucose 121, BUN is 10, creatinine 1.13.      Maple Mirza, PA      Theron Arista C. Eden Emms, MD, [facility]  Electronically Signed    GM/MEDQ  D:  03/18/2009  T:  03/19/2009  Job:  [id number]   cc:   Willow Ora, MD

## 2011-05-08 NOTE — Assessment & Plan Note (Signed)
[facility]                            CARDIOLOGY OFFICE NOTE   QUINNTIN, MALTER                      MRN:          [id number]  DATE:11/08/2008                            DOB:          April 08, 1946    Warren [name] is a delightful 65 year old patient referred by Dr. Drue Novel.  He  has previously been seen by Dr. Graciela Husbands in 2002 for an AV nodal reentrant  ablation and in 2007 by Dr. Antoine Poche.  At that time, he was referred for  murmur.  Dr. Antoine Poche thought it was benign and did not order any test.  The patient has coronary risk factors, which include hypertension,  hypercholesterolemia, and smoking.  He primarily smokes 6 to 8 cigars a  day.   The patient is extremely sedentary.  He is retired from [employer].  He  travels to [place] and [place] to gamble quite frequently.   He does not think he could walk very well on a treadmill test.  The  patient has been having some more left upper quadrant pain.  It is not  really chest pain.  It has been fairly chronic.  He takes Tums at night  and it helps.  He passes gas and it is better.  He has some Protonix  that he does not take irregularly.   The pain is actually not in his chest, it is clearly in his left upper  quadrant.  He does not get it during the day with exertion, only at  night in recumbency.  It does sound like it is more of diverticular or  colonic problem.   He had a previous heart cath by Dr. Meade Maw in 2002 that was  normal.   His review of systems is otherwise remarkable for poor dentition.   Past medical history remarkable for chronic back pain, lower extremity  edema with varicosities, hypertension, elevated PSA, status post SVT  ablation by Dr. Graciela Husbands in 2002.  He has no known allergies.  His family  history is noncontributory.  He is adopted.  The patient is single.  He  has no kids.  He is originally from [place].  As indicated he likes to  go to [place] to  gamble.   He smokes 6 to 8 cigars a day.  He denies alcohol use.   His medications include potassium 40 a day, hydrochlorothiazide 25 a  day, metoprolol 100 a day, and pantoprazole 40 a day.   PHYSICAL EXAMINATION:  GENERAL:  Remarkable for an overweight gentleman  in no distress.  He does smell of cigar smoke.  VITAL SIGNS:  His weight is 255, blood pressure 120/80, pulse 80 and  regular, respiratory rate 14, afebrile.  He has poor dentition.  HEENT:  Unremarkable.  NECK:  Carotids normal without bruit.  No lymphadenopathy, thyromegaly,  or JVP elevation.  LUNGS:  Clear.  Good diaphragmatic motion.  No wheezing.  HEART:  S1 and S2 with a systolic ejection murmur.  PMI normal.  ABDOMEN:  Benign.  Bowel sounds positive.  No AAA, no tenderness, no  bruit, no hepatosplenomegaly, no hepatojugular  reflux, no tenderness.  EXTREMITIES:  Distal pulses are intact.  No edema.  He does have  varicosities bilaterally.  NEUROLOGIC:  Nonfocal.  SKIN:  Warm and dry.   His EKG shows sinus rhythm with left axis deviation, nonspecific ST-T  wave changes.   IMPRESSION:  1. Left lower quadrant pain, multiple coronary risk factors, abnormal      EKG.  Follow up adenosine Myoview.  2. Hypertension, currently well controlled.  Continue current dose of      diuretic.  3. Superficial varicosities.  The patient does not want to be referred      to a Vein Clinic.  He will continue his low-dose diuretics.      Compression stockings would be helpful.  He would appear to be of      moderate risk for phlebitis.  Warning signs were given.  4. 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  Consider      followup abdominal CT if symptoms worsen.  5. Smoking cessation.  Counseling for less than 10 minutes regarding      smoking cessation.  He has had previous palpitations requiring      ablation.  He is not interested in nicotine replacement.  He also      does not seem very interesting in quitting his  cigar habit.  I will      let him Dr. Drue Novel about this further.  As long as his stress Myoview      is normal, he will be seen on a p.r.n. basis.  6. Systolic ejection murmur, longstanding; seen by Dr. Antoine Poche as      well.  Echocardiogram not ordered; however, it only sounds like      aortic sclerosis; and unless he has new symptoms, I would not order      an echocardiogram at this time either.     Noralyn Pick. Eden Emms, MD, [facility]  Electronically Signed    PCN/MedQ  DD: 11/08/2008  DT: 11/09/2008  Job #: [id number]   cc:   Willow Ora, MD

## 2011-05-08 NOTE — H&P (Signed)
NAMEERSKIN, ZINDA NO.:  [id number]   MEDICAL RECORD NO.:  [id number]          PATIENT TYPE:  INP   LOCATION:  3707                         FACILITY:  [facility]   PHYSICIAN:  Therisa Doyne, MD    DATE OF BIRTH:  06-12-1946   DATE OF ADMISSION:  03/15/2009  DATE OF DISCHARGE:                              HISTORY & PHYSICAL   CHIEF COMPLAINT:  Palpitations.   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  He presents with atrial  fibrillation with rapid ventricular response.  The patient reports that  today he had 5 hours of palpitations associated with some discomfort in  his left lower quadrant of his abdomen.  He had associated jitteriness  with these sensations.  He denies any syncope, chest pain, shortness of  breath, lower extremity edema, orthopnea, or PND.  Because of these  symptoms, he came to the emergency department.  He was noted to be in  atrial fibrillation with rapid ventricular response.  In the emergency  department, we started him on a diltiazem drip because his ventricular  rate was approximately 120 beats per minute.  With the diltiazem drip,  his rate is currently in the 80s.   The patient states that he has palpitations on a daily basis and that is  the moment he lies down at night.  He has never had an episode that  lasted this long.  He denies any exertional chest pain or shortness of  breath.  He is quite active.  He is able to do his activities of daily  living without any limitations.   PAST MEDICAL HISTORY:  1. Supraventricular tachycardia in 2002.  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  He underwent radiofrequency ablation.  2. He had a cardiac  catheterization in 2001 which showed normal      coronary arteries.  3. The patient reports that he had a normal nuclear stress test this      past few months ago.  4. 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  The patient denies this, however, there is a      mention of this in the medical record.  5. Hypertension.  6. Hyperlipidemia.  7. Tobacco abuse.  8. Chronic back pain.  9. History of Bell palsy.   SOCIAL HISTORY:  The patient lives at home alone.  He denies any alcohol  use.  He smokes approximately 6-8 cigars per day.  He drinks  approximately 2 L of caffeinated beverages per day.   FAMILY HISTORY:  Unknown as the patient is adopted.   ALLERGIES:  No known drug allergies.   MEDICATIONS:  1. Potassium chloride 40 mEq daily.  2. Hydrochlorothiazide 25 mg daily.  3. Toprol-XL 100 mg daily.  4. Protonix 40 mg daily.  5.  Xanax 1 mg at bedtime p.r.n.  6. Norvasc 10 mg daily.   REVIEW OF SYSTEMS:  All systems reviewed and are negative except for  what is mentioned above in history of present illness.   PHYSICAL EXAMINATION:  VITAL SIGNS:  Temperature afebrile, blood  pressure on my exam is 115/75, pulse 85 and irregular, respirations 89,  and oxygen saturation 95% on room air.  GENERAL:  No acute distress.  Obese male.  HEENT:  Normocephalic and atraumatic.  Pupils are equal, round, and  reactive to light and accommodation.  Extraocular movements are intact.  NECK:  Supple.  No lymphadenopathy.  No jugular venous distention or  masses.  CARDIOVASCULAR:  Irregularly irregular.  A 1/6 systolic murmur heard at  left upper sternal border.  No rubs or gallops.  CHEST:  Clear to auscultation bilaterally.  ABDOMEN:  Positive bowel sounds.  Soft, nontender, and nondistended.  EXTREMITIES:  No clubbing, cyanosis, or edema.  Dorsalis pedis pulse 2+  bilaterally.  SKIN:  No rashes.  BACK:  No CVA tenderness.   LABORATORY DATA:  1. EKG  shows atrial fibrillation with rapid ventricular response of      124 beats per minute, normal axis.  There are nonspecific ST-T wave      changes.  2. Chest x-ray showed no acute cardiopulmonary disease.  3. Pertinent laboratory data showed hypokalemia with a potassium of      3.3.  Normal liver function studies.  Normal renal function with a      BUN of 16 and creatinine of 1.3.  First set of cardiac enzymes was      negative.   ASSESSMENT AND PLAN:  Mr. 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   1. We will admit the patient to [facility] under Dr. Reginia Forts care.   1. Atrial fibrillation/atrial flutter with rapid ventricular response.      While I was in the room, he had episodes that looked to be atrial      flutter on the monitor.  Currently, he is rate controlled on a      diltiazem drip at 10 mg per hour.  We will continue the diltiazem      drip and titrate this for heart rate of less than 100.  We will      start the patient on systemic anticoagulation with heparin the      drip.  As he appears to be symptomatic from his tachy arrhythmias,      I feel that he would benefit from a strategy of rhythm control.      Consider transesophageal echocardiogram to rule out clot in left      atrium followed by DC cardioversion.  I will also check a      transthoracic echocardiogram to rule out any structural heart      disease so that we can determine what type of anti-arrhythmic drug      will be appropriate for him as he does not have a history of      coronary artery disease.  I suspect that a class IC antiarrhythmic      such as propafenone or flecainide would be a reasonable option.  As      far as the etiology of his atrial fibrillation,  certainly it  could      be related to his hypokalemia.  We will aggressively replace his      potassium.  We will check a TSH and rule the patient out for      myocardial infarction.  I suspect that this is not ischemia      mediated given his history of normal coronary arteries and a stress      test that was reportedly negative a few months ago.  We will      continue him back at rate control medicine with Toprol-XL 100 mg      daily.   1. Hypertension, currently well controlled.  We will hold his Norvasc.      Continue hydrochlorothiazide and Toprol.   1. Gastroesophageal reflux disease.  Continue Protonix.   1. Fluids, electrolytes, and nutrition.  Saline lock IV fluids,      aggressively replete his potassium, n.p.o. after midnight for      possible TEE and cardioversion.   1. Deep vein thrombosis prophylaxis, not indicated as the patient will      be systemically anticoagulated with a heparin drip.      Therisa Doyne, MD  Electronically Signed     SJT/MEDQ  D:  03/15/2009  T:  03/16/2009  Job:  [id number]

## 2011-05-11 NOTE — Discharge Summary (Signed)
[place]  Patient:    Warren [name], Warren [name]                      MRN: [id number] Adm. Date:  12/31/99 Disc. Date: 12/31/00 Dictator:   Chinita Pester, N.P. CC:         Vikki Ports, M.D.  Meade Maw, M.D.  Nathen May, M.D., [facility]   Discharge Summary  PRIMARY DIAGNOSIS:  Supraventricular tachycardia.  SECONDARY DIAGNOSIS:  Hypertension.  HISTORY OF PRESENT ILLNESS:  A 65 year old gentleman with a history of hypertension and SVT.  He was admitted for a radiofrequency catheter ablation.  PAST MEDICAL HISTORY:  Positive for hypertension and supraventricular tachycardia.  FAMILY HISTORY:  Noncontributory.  SOCIAL HISTORY:  Positive tobacco of one cigar per day.  No alcohol or drugs.  ALLERGIES:  States allergies to SULFA.  HOSPITAL COURSE:  The patient was admitted and underwent a radiofrequency catheter ablation of a left-sided accessory pathway.  He tolerated the procedure well and was discharged the following day.  DISCHARGE MEDICATIONS: 1. Coated aspirin 325 mg daily. 2. Norvasc 10 mg daily. 3. K-Dur 20 mg daily. 4. Toprol 50 mg daily for one week and then 50 mg every other day.  ACTIVITY:  He is not to have any strenuous activity or heavy lifting for four days.  No driving for 48 hours.  DIET:  A low-fat, low-cholesterol diet.  WOUND CARE:  He was instructed to keep his wounds clean and dry.  If he develops redness or drainage, to call the office.  If bleeding occurs, he is to hold pressure and call 911.  SPECIAL INSTRUCTIONS:  Antibiotic prophylaxis for approximately three months.  FOLLOW-UP:  He is to follow up with Nathen May, M.D., F.A.C.C., on January 20, 2001, at 11:45 a.m.  The patient was also to continue following with Meade Maw, M.D. DD:  12/31/00 TD:  12/31/00 Job: [id number]

## 2011-05-11 NOTE — Consult Note (Signed)
[place]  Patient:    Warren [name], Warren [name]                      MRN: [id number] Proc. Date: 11/19/00 Adm. Date:  [id number] Attending:  Meade Maw A Dictator:   Chinita Pester, CRNP CC:         Vikki Ports, M.D.  Meade Maw, M.D.   Consultation Report  REASON FOR CONSULTATION:  Tachycardia.  HISTORY OF PRESENT ILLNESS:  This is a 65 year old gentleman with a history of hypertension and an SVT who was admitted with an episode of abdominal and chest tightness as well as some EKG changes consistent with ischemias.  During hospitalization the patient underwent a cardiac catheterization which showed normal coronaries, ejection fraction was not obtained during catheterization.  Post catheterization the patient got up to go to the bathroom and had an episode of bradycardia with a heart rate in the 40s and felt light-headed and dizzy.  Upon returning to bed, the patient felt well.  Initial blood pressure was 80s/50s.  The patient does state a history of tachycardia palpitations in the past, the first occurring on August 24, 2000.  He described it as a sudden onset lasting a couple of hours.  The patient was to go for family practice at that time and was admitted to [facility].  During that time period the patients potassium, as per patient, was said to be "low".  Since that episode he has complained of 2 previous episodes subsequent to the first episode which were also sudden onset lasting a couple of hours.  The patient was admitted this admission as well with tachycardia.  On admission his heart rate was noted to be in the 170s, potassium also was 2.8.  Also of note, the patient does state other previous episodes of syncope in the past which the patient relates to rate late.  On this admission the patients cardiac enzymes were also negative.  PAST MEDICAL HISTORY: 1. Positive for hypertension. 2. Supraventricular  tachycardia.  FAMILY HISTORY:  Noncontributory.  SOCIAL HISTORY:  Positive tobacco for 1 cigar per day.  No alcohol or drug use.  ALLERGIES:  States allergies to SULFA.  MEDICATIONS: 1. Toprol XL 100 daily. 2. Aspirin 325 mg daily. 3. K-Dur 20 mEq daily. 4. Norvasc 10 daily.  REVIEW OF SYSTEMS:  Essentially unremarkable.  PHYSICAL EXAMINATION:  GENERAL:  This is a well-developed 65 year old male who is ambulating in his room in no acute distress.  HEENT:  Pupils, equal, round, reactive to light.  Sclerae clear.  NECK:  Supple, nontender.  No bruits.  CARDIOVASCULAR:  Rate regular rhythm.  Positive S1, S2.  No murmur.  LUNGS:  Clear to auscultation bilaterally.  ABDOMEN:  Soft, round, nontender, normoactive bowel sounds.  EXTREMITIES:  Showed no edema, cyanosis, clubbing or edema.  NEUROLOGIC:  Awake, alert, and oriented x 3.  LABORATORY DATA:  A previous EKG reviewed with Dr. Graciela Husbands on October 20, 2000, shows a SVT versus AVRT.  There is a narrow QRS with a cycle length of 340. No pseudo-R climb in V1 or pseudo-Q in 3.  IMPRESSION: 1. 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  Recurrent syncope. 2. Bradycardia secondary to vagal or aggravated by beta blockers. 3. Hypertension.  RECOMMENDATIONS:  Agree to decrease Toprol, discontinue hydrochlorothiazide, EP study for a radiofrequency catheter ablation.  This was discussed, benefits and risks with patient by Dr. Graciela Husbands in length.  Not to exclude death, perforation, heart block  and vascular complications.  It is okay to DC patient and [place] office will be in contact with the patient to schedule his ablation as an outpatient. DD:  11/19/00 TD:  11/19/00 Job: [id number]

## 2011-05-11 NOTE — Discharge Summary (Signed)
[place]. [facility]  Patient:    BONNER, LARUE                      MRN: [id number] Adm. Date:  [id number] Disc. Date: [id number] Attending:  Osvaldo Human Dictator:   Anselm Lis, N.P. CC:         Vikki Ports, M.D.  Nathen May, M.D., [facility]   Discharge Summary  DATE OF BIRTH:  1946/12/23  CONSULTATIONS:  Dr. Nathen May [facility]).  PRIMARY CARE Ahnyla Mendel:  Vikki Ports, M.D.  PROCEDURES: 1. November 18, 2000:  Coronary angiography by Dr. Fraser Din revealing normal    coronary arteries (theoretically left main absent).  LAD and circumflex    have different takeoffs.  Unable to complete ventriculogram secondary to    SVT with placement of a catheter into the ventricle. 2. November 15, 2000:  A 2-D echocardiogram, results pending at time of this    dictation.  DISCHARGE DIAGNOSES/HOSPITAL COURSE:  Mr. Gelpi is a pleasant 65 year old gentleman with history of hypertension and palpitations/SVT. #1 - TACHYCARDIA-BRADYCARDIA SYNDROME:  He was admitted with tachycardia with rates as high as 177 with narrow complex rhythm and also episodes of bradycardia with rate to 45 beats per minute.  These dysrhythmias were in the setting of hypokalemia with serum potassium of 2.8.  On the morning of admission he had had abdominal gas-like pressure which radiated to his chest. He was admitted to telemetry and initiated on subcu Lovenox and IV heparin. The patient did have ischemic EKG changes in setting of SVT and posttachycardia ischemic inferior EKG changes.  On November 26 he underwent coronary angiography which revealed no obstruction coronary artery disease. LV-gram was not performed at the time secondary to induction of SVT when catheter was introduced into the ventricle.  The evening of November 26, the patient did have bradycardia down to 37 beats per minute which may have been related to painful stimuli from  groin tape removal resulting in vagaling.  Dr. 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  Felt bradycardia may possibly be vagal in etiology and possibly aggravated by beta-blocker.  He agreed with decreasing dose of Toprol, discontinuation of hydrochlorothiazide, and planning for electrophysiology study and catheter-based ablation be scheduled as an outpatient.  #2 - CHEST DISCOMFORT/ABDOMINAL INDIGESTION:  The patient ruled out for a myocardial infarction by negative serial cardiac enzymes.  He did have a borderline at 0.10.  Coronary angiography was negative for obstruction coronary artery disease.  It was noted he had LAD and circumflex from different takeoffs; theoretically left main absent.  Results of 2-D echo were pending at time of this dictation; ventriculogram was not performed secondary to SVT arrhythmia with placement of catheter into the ventricle.  Lipid profile was obtained; cholesterol of 144, triglycerides 182, HDL of 28, and LDL of 80.  #3 - HYPOKALEMIA:  Serum potassium of 2.8 upon presentation to the emergency room.  He was supplemented periodically throughout course of admission; peak potassium of 4.1.  Potassium was 3.5 at time of discharge.  The patient was given an additional 40 mEq potassium on day of discharge.  He will be discharged on additional 40 mEq potassium q.d.  #4 - HISTORY OF HYPERTENSION:  Good control on current medical regimen.  CONDITION ON DISCHARGE:  The patient is discharged home in stable condition.  DISCHARGE MEDICATIONS: 1. Toprol XL 100 mg  p.o. q.d. 2. Norvasc 10 mg p.o. q.d. 3. Hydrochlorothiazide 25 mg p.o. q.d. 4. Enteric-coated aspirin 325 mg once q.d. 5. (New) K-Dur 20 mEq two tabs once q.d.  DISCHARGE INSTRUCTIONS: 1. Activity:  No heavy lifting or pushing greater than 10-15 pounds or driving    day of discharge and the day postdischarge. 2. Diet:  Low  fat, low cholesterol recommended. 3. Wound care:  May shower.  SPECIAL DISCHARGE INSTRUCTIONS:  The patient to call our clinic if he develops large amount of swelling or bruising in groin area.  DISCHARGE FOLLOWUP: 1. Dr. Hillary Bow:  The patient to call to be seen in approximately    one to two weeks. 2. He will come to our clinic Friday, November 30 for BMET at [employer],    [address]. 3. The patient to call Annice Pih (Dr. Koren Bound nurse) within the next few days to    set up appointment for ablation.  LABORATORY AND X-RAY DATA:  CBC revealed WBC of 8.8, hemoglobin of 15.1, hematocrit of 42.3, platelets 186.  Admission chemistry profile revealed sodium 14, K of 2.6 (supplemented and 3.5 at time of discharge), chloride 102, glucose 127, BUN of 18, creatinine 1.2, magnesium 2.0, calcium 8.5.  Serial cardiac enzymes were negative x 3 though troponin I slightly elevated at 0.10 (single reading).  Coags:  Pro time of 13.3 with INR of 1.1 and PTT of 37.  Chest x-ray revealed no evidence of acute abnormality.  Admission EKG revealed normal sinus rhythm with sinus tachycardia with multiple PVCs.  Initial EKG revealed marked ST segment depression inferior and lateral leads.  Follow-up EKG appeared relatively normal.  PAST MEDICAL HISTORY:  Tachycardia, hypertension. DD:  12/05/00 TD:  12/06/00 Job: [id number]

## 2011-05-11 NOTE — Cardiovascular Report (Signed)
[place]. [facility]  Patient:    Warren [name], Warren [name]                      MRN: [id number] Proc. Date: 11/18/00 Adm. Date:  [id number] Attending:  Meade Maw A                        Cardiac Catheterization  PROCEDURES PERFORMED: 1. Left heart catheterization. 2. Coronary angiography.  INDICATIONS FOR PROCEDURE:  Supraventricular tachycardia with significant T-wave inversions.  PROCEDURE NOTE:  After obtaining written informed consent, the patient was brought to the cardiac catheterization lab in the postabsorptive state. Preoperative sedation was achieved using IV Versed.  The right groin was prepped and draped in the usual sterile fashion.  Local anesthesia was achieved using 1% xylocaine.  A 6-French hemostasis sheath was placed into the right femoral artery using the modified Seldinger technique.  Selective coronary angiography was performed using the JL-4 and JR-4 Judkins catheters. All catheter exchanges were made over a guide wire.  The circumflex vessel was engaged using a multipurpose catheter.  Following the procedure, the patient was transferred to the holding area.  The hemostasis sheath was removed. Hemostasis was achieved using digital pressure.  FINDINGS:  Aortic pressure 120/84.  LVEDP was 21.  CORONARY ANGIOGRAPHY:  Theoretically, there was no left main coronary artery.  LEFT ANTERIOR DESCENDING:  The left anterior descending gave rise to moderate D1, large D2 and large D3.  There was no significant disease in the left anterior descending.  RIGHT CORONARY ARTERY:  The right coronary artery was a large dominant artery and gave rise to a large PDA and large PL branch.  There was sno significant disease in the right coronary artery.  CIRCUMFLEX:  The circumflex had almost a cojoined takeoff with the right coronary artery and was posteriorly directed.  There was a moderate bifurcating OM1, large OM2, and a large OM3.  There was no  significant disease in the circumflex vessel.  IMPRESSION:  Normal coronary angiography.  Unable to complete the ventriculogram secondary to supraventricular tachy arrhythmia with placement of the catheter into the ventricle.  Will continue with medical management of his supraventricular tachycardia which was probably exacerbated by his hypokalemia.  The patient will be discharged on the same medications as admission with the addition of potassium 40 mEq. DD:  11/18/00 TD:  11/18/00 Job: [id number]

## 2011-05-11 NOTE — H&P (Signed)
[name]. [facility]  Patient:    Warren [name], Warren [name]                      MRN: [id number] Adm. Date:  [id number] Disc. Date: [id number] Attending:  Meade Maw A Dictator:   Anselm Lis, N.P. CC:         Warren [name], M.D.  Warren [name], M.D. [facility]   History and Physical  HISTORY OF PRESENT ILLNESS:  Mr. Warren [name] is a 65 year old gentleman with a history of tachypalpitations, who was recently discharged from [facility] on November 19, 2000, with tachycardia/bradycardia, and episodes of chest pressure, in the setting of hypokalemia.  He ruled out for a myocardial infarction and has had subsequent heart catheterization, revealing no significant coronary artery disease.  Preserved LV function.  He was evaluated by Dr. Nathen [name] (EPS), who felt that the patient would benefit from an EP study for a radiofrequency catheter ablation.  This has been scheduled for January 01, 2001.  The patient was doing well since discharge until this morning.  He developed the abrupt onset of rapid heart rate.  He came to the emergency room and was found to be in a narrow complex supraventricular tachycardia, rate 180 beats per minute.  He converted to sinus rhythm transiently after 12.5 mg of IV rapid push adenosine.  His blood pressure with rapid rhythm at the 180s, was approximately 75 systolic.  He was rebolused with 6.5 mg of adenosine again, with the resumption of sinus rhythm, but this time was administered 20 mg of IV Cardizem, slow IV push.  He has maintained NSR for approximately two hours post this procedure.  His serum potassium was found low at 3.3.  He was supplemented with initial 40 mEq, with instructions to take another 40 mEq this evening, as he usually is scheduled.  He is currently maintaining sinus rhythm with a rate in the 80s.  His electrocardiogram is without ischemic changes.  His first CPK, MB, and troponin  are negative.  He has some left lateral abdominal discomfort, which is not unusual for him, thought related to gas.  He had no anterior chest discomfort during palpitations, or undue shortness of breath, only transient dizziness when first getting out of the car.  PAST MEDICAL HISTORY: 1. Supraventricular tachycardia, as above, with T-wave inversions in    the past.  Cardiac catheterization on November 18, 2000, revealing    no significant disease or stenosis of the coronary arteries.  Normal    LV function. 2. History of hypertension.  ALLERGIES:  SULFA EYEDROPS.  CURRENT MEDICATIONS: 1. Hydrochlorothiazide 25 mg one p.o. q.d. 2. Norvasc 10 mg p.o. q.d. 3. Toprol XL 100 mg p.o. q.d. 4. Klor-Con 20 mEq two q.d. 5. Bayer aspirin 325 mg one q.d.  SOCIAL HISTORY/HABITS:  Tobacco:  One cigar a day.  ETOH:  Negative. Caffeine:  None excessive.  FAMILY HISTORY:  Noncontributory.  The patient was adopted and does not know his family.  REVIEW OF SYSTEMS:  As in the HPI and past medical history.  Otherwise the review of systems was done, and was essentially benign.  Specifically, no orthopnea, PND, or pedal edema.  Negative for chest pain.  Episodic left lateral abdominal discomfort, not related to gas, good relief with antacids (Gas-X).  PHYSICAL EXAMINATION:  (As performed by Dr. Meade Maw):  VITAL SIGNS:  Blood pressure initially 75 systolic, heart rate 180s. Subsequent blood pressure with  normal sinus rhythm was 110/68.  He is afebrile.  His O2 saturation 100% on room air.  GENERAL:  He is an overweight middle-aged male, appearing older than his 54 years.  HEENT/NECK:  Brisk bilateral carotid upstroke without bruit.  No significant jugular venous distention or thyromegaly.  CARDIAC:  A regular rate and rhythm with grade 3/6 systolic murmur, loudest at the apex.  CHEST:  Bibasilar crackles, otherwise clear.  ABDOMEN:  Soft, obese, normoactive bowel sounds.  Negative  abdominal aortic, renal, or femoral bruits.  EXTREMITIES:  Distal pulses intact.  Negative pedal edema.  GENITOURINARY:  Deferred.  RECTAL:  Deferred.  NEUROLOGIC:  Nonfocal.  Cranial nerves II-XII grossly intact.  Alert and oriented x 3.  LABORATORY DATA:  Chemistry profile reveals a sodium of 138, K low at 33., chloride 104, CO2 of 22, glucose 115, BUN 20, creatinine 1.3.  Liver function tests within normal range.  CPK 79, MB fraction 2.0, troponin I 0.01.  Pro time of 12.6, INR 1, PTT 29.  WBC 1.1, hemoglobin 15.6, hematocrit 41.5, platelets 258.  Initial electrocardiogram revealed sinus tachycardia with first-degree AV block, rate 187 beats per minute.  Follow-up electrocardiogram in sinus rhythm revealed 87 beats per minute with some ST segment scooping inferiorly, in the lateral/precordial leads, otherwise nonischemic.  Chest x-ray:  Pending at the time of this dictation.  IMPRESSION:  (As dictated by Dr. Meade Maw): 1. Recurrent supraventricular tachycardia, in this 65 year old male,    who has been evaluated by an electrophysiological study (Dr. Nathen [name]), and is scheduled for an elective EPS study with radiofrequency    ablation in early January 2002.  Recurrent supraventricular tachycardia    aborted after a total of two doses of intravenous adenosine, and subsequent    intravenous Cardizem bolus (20 mg).  Currently maintaining a normal sinus    rhythm in the 80s.  This is in the setting of hypokalemia with a serum    potassium of 3.3. 2. Hypokalemia with serum potassium of 3.3.  PLAN:  (As dictated by Dr. Fraser Din): 1. Potassium supplemented with 40 mEq.  To take an additional 40 mEq this    evening. 2. Medications as prior to the emergency room visit, though will increase    his potassium to a total of 40 mEq b.i.d. (from 40 mEq q.d.). 3. Will follow up with Dr. Graciela Husbands.  Was evaluated by his P.A., Diane, here     in the emergency room, who  phone-consulted Dr. Graciela Husbands.  There will be an    attempt to work him into the schedule earlier than his scheduled    ablation in early January 2002. DD:  12/03/00 TD:  12/03/00 Job: [id number]

## 2011-05-11 NOTE — H&P (Signed)
[place]. [facility]  Patient:    KALONJI, ZURAWSKI                      MRN: [id number] Adm. Date:  [id number] Disc. Date: [id number] Attending:  Molpus, Carlisle Beers CC:         Vikki Ports, M.D.  Meade Maw, M.D.   History and Physical  HISTORY OF PRESENT ILLNESS:  Mr.  Geisel is a 65 year old gentleman with a history of hypertension and SVT.  He is admitted with an episode of abdominal and chest tightness, as well as some electrocardiogram changes, consistent with ischemia.  The patient has a history of tachypalpitations in the past.  He was diagnosed as having SVT, and was treated with Toprol.  He has done fairly well, and was scheduled for a Cardiolyte study next week for further evaluation.  Early this morning while at rest, he developed gas-like chest pressure in his abdomen, which radiated up to his chest.  It became quite uncomfortable.  He noticed that his heart was beating very rapidly during these episodes of chest pain. He presented to the emergency room for further evaluation.  The discomfort is described as a pressure-like sensation, and he thinks it is related to indigestion or gas.  The patient was found to be quite tachycardic (heart rate of 177, probably a narrow complex rhythm), and then he developed fairly severe bradycardia (heart rate of 45).  The patient stabilized, and his electrocardiogram normalized.  His EKG for the next hour following these episodes of tachycardia and bradycardia, revealed fairly marked ST segment depression in the inferior and lateral leads.  His follow-up electrocardiogram an hour or so later revealed no ST segment depression.  He is admitted for further evaluation of these electrocardiogram changes, and these episodes of arrhythmia.  CURRENT MEDICATIONS: 1. Toprol XL 200 mg q.d. 2. Hydrochlorothiazide 25 mg q.d. 3. Norvasc 10 mg q.d. 4. Aspirin one q.d.  ALLERGIES:  He has an allergic reaction to  SULFA EYE DROPS.  PAST MEDICAL HISTORY: 1. Tachycardia. 2. Hypertension.  SOCIAL HISTORY:  The patient smokes one cigar a day.  He does not drink alcohol.  FAMILY HISTORY:  Noncontributory.  The patient was adopted and does not know his family.  REVIEW OF SYSTEMS:  Reviewed, and is essentially negative.  PHYSICAL EXAMINATION:  GENERAL:  He is a middle-aged gentleman, in no acute distress.  He is alert and oriented x 3.  His mood and affect are normal.  VITAL SIGNS:  His heart rate currently is 93, blood pressure 128/81.  HEENT/NECK:  Reveals 2+ carotids.  No jugular venous distention, no thyromegaly.  There is no lymphadenopathy.  LUNGS:  Clear to auscultation.  BACK:  Normal.  HEART:  Regular rate, S1, S2.  No murmurs, gallops, or rubs.  ABDOMEN:  Good bowel sounds, nontender.  No hepatosplenomegaly.  No guarding or rebound.  EXTREMITIES:  His calves are nontender.  His pulses are intact.  There is no cyanosis, clubbing, or edema.  NEUROLOGIC:  Cranial nerves II-XII intact.  Motor and sensory function are intact.  His gait was not assessed.  GENITOURINARY:  Examination was deferred.  Electrocardiogram revealed normal sinus rhythm, with sinus tachycardia.  He has multiple PVCs.  His initial electrocardiogram reveals marked ST segment depression in the inferior and lateral leads.  His follow-up electrocardiogram is relatively normal.  LABORATORY DATA:  Initial CPK is 62.  Potassium 2.8.  IMPRESSION: 1. The patient presents with  tachycardia, bradycardia, episodes of chest    pressure, and significant hypokalemia.  I do not think that the episodes    of tachycardia and bradycardia are all due to the electrolyte disturbances.     The fact that he has fairly more ST segment depression following these    episodes, suggest that these are ischemic in origin.  The tachycardia    appears to be narrow complex, but I certainly cannot rule out a wide    complex tachycardia  like ventricular tachycardia.  PLAN:  We will admit him to the hospital and start him on Lovenox and IV nitroglycerin.  We will proceed with a heart catheterization.  2. Hypokalemia.  PLAN:  The patient is on hydrochlorothiazide.  We will replace his potassium and start him on 20 mEq q.d.  3. Hypertension, currently stable. DD:  11/14/00 TD:  11/14/00 Job: [id number]

## 2011-05-11 NOTE — Op Note (Signed)
[place]. [facility]  Patient:    Warren [name], Warren [name]                      MRN: [id number] Proc. Date: 12/30/00 Adm. Date:  [id number] Attending:  Nathen May CC:         Candace Cruise, M.D.   Operative Report  OPTICAL DISK NO:  [id number]  PREOPERATIVE DIAGNOSIS:  Supraventricular tachycardia.  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  OPERATION:  Invasive electrophysiology study, catheter remapping and radiofrequency ablation.  SURGEON:  Nathen May, M.D.  DESCRIPTION OF PROCEDURE:  Cardiac catheterization was performed with local anesthesia and conscious sedation.  Noninvasive blood pressure monitoring and transcutaneous oxygen saturation monitoring were performed continuously throughout the procedure.  CO2 monitoring were performed continuously throughout the procedure.  Following the insertion of the catheters and stimulation protocol included, incremental atrial pacing.  Incremental ventricular pacing.  Single ventricular extra stimuli at a paced cycle length of 600 msec.  Results:  Surface electrocardiogram. Rhythm:  Initial is sinus.  Final is sinus. Cycle length:  Initial is 678 msec.  Final is 809 msec. P-R interval: Initial is 153 msec.  Final is 145 msec. QRS duration: Initial is 83 msec.  Final is 94 msec. Q-T interval:  Initial is 348 msec.  Final is 391 msec. P-wave duration:  Initial is 143 msec.  Final is 114 msec. Bundle branch block:  Initial is absent.  Final is absent. Preexcitation:  Initial is absent.  Final is absent.  AV nodal function: A-H interval:  Initial is 65 msec.  Final is 69 msec. AV Wenckebach:  Initial was 340 msec. VA Wenckebach:  400 msec following ablation and there was VA conduction over the pathway prior to ablation.  AV nodal effector refractor had a paced cycle length of 600 msec and was less than equal to 310 msec  representing _______________ to the atrium and at 400 msec.  AV nodal conduction was continuous pre and post ablation.  His-Purkinje system function: HV interval: Initial is 49 msec.  Final is 55 msec.  Accessory pathway function:  A left lateral accessory pathway access was in site; 8-10 was identified mediated orthodromic supraventricular tachycardia. There was no antegrade conduction evident although the AV times prior to ablation were exceedingly short.  The retrograde effector refractor had a pace cycle length of 600 msec and was 280 msec.  This pathway mediated orthodromic supraventricualr tachycardia with a cycle length of approximately 370 msec.  It was reproducibly inducible with ventricular pacing as well as an atrial pacing at 600:280.  It could be terminated with atrial pacing.  Characteristics of this tachycardia included: a. Atrial preexcitation during His bundle refractory. b. Early atrial activation was in the coronary sinus although in the coronary    sinus sites, the [place] only time was only 100 msec at the site of ablation,    the [place] time at the site of ablation was 87 msec. c. The pre-excitation index was 0.66 supportive of a left lateral    accessory pathway. d. Fluoroscopy time:  A total of 15 minutes was utilized at 15 frames per    second.  Radiofrequency energy:  A total of 76 seconds of RF energy was applied in sites of earliest activation as delineated above.  After 7 seconds, VA conduction terminated during ventricular pacing.  RF was maintained for a total of 45 seconds.  There was no patient discomfort.  RF was terminated and then resumed shortly thereafter for another 31 seconds.  Thereafter VA conduction did not recur during a watching phase of 23 minutes.  IMPRESSION: 1. Normal sinus function. 2. Normal atrial function. 3. Normal AV nodal function. 4. Normal His paracinesia system. 5. 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  Radiofrequency energy applied at    approximately subsites 8-10, successfully interrupted retrograde    conduction of the accessory pathway and eliminated the substrate for    the patients clinical tachycardia. 6. Normal ventricular response to programmed stimulation as    described above.  SUMMARY:  In summary, the results of electrophysiological testing confirm any reentry of the mechanism underlying Mr. Schmidts recurrent supraventricular tachycardia and clarified the location of this as the left lateral accessory pathway of [place] site 8-10.  Interestingly, the AV time at this site was exceedingly short although it should be noted that the [facility] interval was also exceedingly short at approximately 60 msec.  The VA time here was minimally prolonged at 87 msec although there may have been another component of the electrogram that was caught in the terminal inflections of the ventricular electrogram that could not be clarified.  Radiofrequency energy successfully interrupted retrograde conduction over this pathway albeit at a somewhat lengthy interval at 7.2 seconds.  Not withstanding, a consolidation burn was given and no further VA conduction was identified.  The patients concealed accessory pathway being the substrate then for his tachycardia was no longer evidenced at the end of the procedure.  RECOMMENDATIONS: 1. Observe overnight. 2. Tenormin, reduce dose to 50 mg q.d. 3. Endocarditis prophylaxis for three months. 4. Aspirin daily for six weeks. DD:  12/30/00 TD:  12/30/00 Job: [id number]

## 2011-05-23 ENCOUNTER — Other Ambulatory Visit: Payer: Self-pay | Admitting: Internal Medicine

## 2011-05-23 MED ORDER — HYDROCODONE-ACETAMINOPHEN 10-660 MG PO TABS: ORAL_TABLET | ORAL | Status: DC

## 2011-05-23 NOTE — Telephone Encounter (Signed)
60, 1 RF 

## 2011-05-23 NOTE — Telephone Encounter (Signed)
Please advise 

## 2011-06-19 ENCOUNTER — Ambulatory Visit: Payer: Self-pay | Admitting: Internal Medicine

## 2011-06-25 ENCOUNTER — Other Ambulatory Visit: Payer: Self-pay | Admitting: *Deleted

## 2011-06-25 MED ORDER — POTASSIUM CHLORIDE CRYS ER 20 MEQ PO TBCR: EXTENDED_RELEASE_TABLET | ORAL | Status: DC

## 2011-07-02 ENCOUNTER — Other Ambulatory Visit: Payer: Self-pay | Admitting: Internal Medicine

## 2011-07-02 MED ORDER — HYDROCHLOROTHIAZIDE 25 MG PO TABS: 25.0000 mg | ORAL_TABLET | Freq: Every day | ORAL | Status: DC

## 2011-07-02 NOTE — Telephone Encounter (Signed)
Sent in

## 2011-07-03 ENCOUNTER — Other Ambulatory Visit: Payer: Self-pay | Admitting: *Deleted

## 2011-07-03 MED ORDER — DILTIAZEM HCL ER COATED BEADS 120 MG PO CP24: 120.0000 mg | ORAL_CAPSULE | Freq: Every day | ORAL | Status: DC

## 2011-08-29 ENCOUNTER — Other Ambulatory Visit: Payer: Self-pay | Admitting: Internal Medicine

## 2011-08-29 NOTE — Telephone Encounter (Signed)
Hydrocodone request [last refill 05/23/11 #60x1] Diltiazem request [last refill 07/03/11 #30x1]

## 2011-08-30 MED ORDER — DILTIAZEM HCL ER COATED BEADS 120 MG PO CP24: 120.0000 mg | ORAL_CAPSULE | Freq: Every day | ORAL | Status: DC

## 2011-08-30 MED ORDER — HYDROCODONE-ACETAMINOPHEN 10-660 MG PO TABS: ORAL_TABLET | ORAL | Status: DC

## 2011-08-30 NOTE — Telephone Encounter (Signed)
Advise pt: ok to call 1 month and 1 RF, no further RF w/o OV

## 2011-08-30 NOTE — Telephone Encounter (Signed)
Rx[s] Done. Attempted to contact patient--message states "invalid phone number".?

## 2011-09-20 ENCOUNTER — Other Ambulatory Visit: Payer: Self-pay | Admitting: Internal Medicine

## 2011-09-20 NOTE — Telephone Encounter (Signed)
Potassium Chloride request: last refill 06/25/2011 #270x0 [1 tab TID] Too early for refill by calculation; please advise.

## 2011-09-24 LAB — POCT CARDIAC MARKERS
CKMB, poc: 1.6
CKMB, poc: 1.8
Myoglobin, poc: 73.9
Troponin i, poc: <0.05

## 2011-09-24 LAB — DIFFERENTIAL
Eosinophils Absolute: 0.5
Eosinophils Relative: 5
Lymphs Abs: 3
Monocytes Relative: 10

## 2011-09-24 LAB — URINALYSIS, ROUTINE W REFLEX MICROSCOPIC
Glucose, UA: NEGATIVE
Hgb urine dipstick: NEGATIVE
Specific Gravity, Urine: 1.021
pH: 5

## 2011-09-24 LAB — COMPREHENSIVE METABOLIC PANEL
ALT: 41
AST: 36
Calcium: 9.6
GFR calc Af Amer: >60
Sodium: 141
Total Protein: 6.8

## 2011-09-24 LAB — CBC
MCHC: 33.3
RDW: 13.9

## 2011-09-25 MED ORDER — POTASSIUM CHLORIDE CRYS ER 20 MEQ PO TBCR: EXTENDED_RELEASE_TABLET | ORAL | Status: DC

## 2011-09-25 NOTE — Telephone Encounter (Signed)
Done 09/20/11 #270x0

## 2011-09-26 ENCOUNTER — Ambulatory Visit (INDEPENDENT_AMBULATORY_CARE_PROVIDER_SITE_OTHER): Payer: 59 | Admitting: Internal Medicine

## 2011-09-26 ENCOUNTER — Encounter: Payer: Self-pay | Admitting: Internal Medicine

## 2011-09-26 DIAGNOSIS — I4891 Unspecified atrial fibrillation: Secondary | ICD-10-CM

## 2011-09-26 DIAGNOSIS — I10 Essential (primary) hypertension: Secondary | ICD-10-CM

## 2011-09-26 DIAGNOSIS — M549 Dorsalgia, unspecified: Secondary | ICD-10-CM

## 2011-09-26 DIAGNOSIS — E785 Hyperlipidemia, unspecified: Secondary | ICD-10-CM

## 2011-09-26 DIAGNOSIS — R972 Elevated prostate specific antigen [PSA]: Secondary | ICD-10-CM

## 2011-09-26 DIAGNOSIS — E079 Disorder of thyroid, unspecified: Secondary | ICD-10-CM

## 2011-09-26 LAB — TSH: TSH: 2.93 u[IU]/mL (ref 0.35–5.50)

## 2011-09-26 LAB — BASIC METABOLIC PANEL
BUN: 25 mg/dL — ABNORMAL HIGH (ref 6–23)
GFR: 60.2 mL/min (ref >60.00)
Potassium: 3.6 mEq/L (ref 3.5–5.1)
Sodium: 141 mEq/L (ref 135–145)

## 2011-09-26 LAB — T4, FREE: Free T4: 0.79 ng/dL (ref 0.60–1.60)

## 2011-09-26 LAB — T3, FREE: T3, Free: 2.5 pg/mL (ref 2.3–4.2)

## 2011-09-26 NOTE — Assessment & Plan Note (Signed)
slt elevated PSA, last DRE 12-11 normal Again rec urology eval

## 2011-09-26 NOTE — Progress Notes (Signed)
  Subjective:    Patient ID: Warren [name], male    DOB: 13-Oct-1946, 65 y.o.   MRN: [id number]  HPI Routine office visit Atrial fibrillation, saw cardiology  01-2011, no changes made on his medication. Hypertension, good medication compliance, reports good ambulatory blood pressures Low back pain that she is stable with Vicodin as needed Abnormal TSH, due for recheck. Obesity, diet has not changed but he is engaging in more walking, has lost 24 pounds in the last few months.  Past Medical History: Reviewed history from 09/13/2010 and no changes required. Hypertension increased PSA Cardiac cath 2001: no CAD Supraventricular tachycardia in 2002.  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  He underwent radiofrequency ablation.   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 myoview 12-09: neg  02-2009: . Atrial fibrillation with rapid ventricular response    Past Surgical History: Reviewed history from 04/12/2009 and no changes required. Appendectomy Cardiac cath 2001: no CAD status post ablation in 2007.  myoview 12-09: neg  tonsillectomy    Review of Systems No chest or shortness of breath No lower extremity edema No nausea, vomiting, diarrhea No dysuria or difficulty urinating.    Objective:   Physical Exam  Constitutional: He is oriented to person, place, and time. He appears well-developed.       Overweight but has decreased weight since the last office visit  Cardiovascular: Normal rate, regular rhythm and normal heart sounds.   No murmur heard. Pulmonary/Chest: Effort normal and breath sounds normal.  Musculoskeletal:       Varicose veins and the distal right leg, no phlebitis on exam.  Neurological: He is alert and oriented to person, place, and time.  Psychiatric: He has a normal mood and  affect. His behavior is normal. Judgment and thought content normal.          Assessment & Plan:

## 2011-09-26 NOTE — Assessment & Plan Note (Signed)
On no meds, loosing wt!

## 2011-09-26 NOTE — Assessment & Plan Note (Signed)
On vicodin prn, RF as needed

## 2011-09-26 NOTE — Assessment & Plan Note (Signed)
Repeat labs:

## 2011-09-26 NOTE — Assessment & Plan Note (Signed)
Seems well controlled  

## 2011-09-26 NOTE — Assessment & Plan Note (Addendum)
Well-controlled, due for labs  

## 2011-10-19 ENCOUNTER — Other Ambulatory Visit: Payer: Self-pay | Admitting: Internal Medicine

## 2011-10-19 MED ORDER — METOPROLOL SUCCINATE ER 200 MG PO TB24: 200.0000 mg | ORAL_TABLET | Freq: Every day | ORAL | Status: DC

## 2011-10-19 NOTE — Telephone Encounter (Signed)
Rx sent in

## 2011-11-13 ENCOUNTER — Other Ambulatory Visit: Payer: Self-pay | Admitting: Internal Medicine

## 2011-11-13 MED ORDER — DILTIAZEM HCL ER COATED BEADS 120 MG PO CP24: 120.0000 mg | ORAL_CAPSULE | Freq: Every day | ORAL | Status: DC

## 2011-11-13 NOTE — Telephone Encounter (Signed)
Rx sent to pharmacy   

## 2011-11-20 ENCOUNTER — Other Ambulatory Visit: Payer: Self-pay | Admitting: Internal Medicine

## 2011-11-22 NOTE — Telephone Encounter (Signed)
Last 09/26/11. Last fill date for xanax 04/23/11. Last fill date for vicodin 08/30/11. Other med request is not on medication list

## 2011-11-23 ENCOUNTER — Other Ambulatory Visit: Payer: Self-pay

## 2011-11-23 ENCOUNTER — Other Ambulatory Visit: Payer: Self-pay | Admitting: Internal Medicine

## 2011-11-23 MED ORDER — HYDROCODONE-ACETAMINOPHEN 10-660 MG PO TABS: ORAL_TABLET | ORAL | Status: DC

## 2011-11-23 MED ORDER — ALPRAZOLAM 0.5 MG PO TABS: ORAL_TABLET | ORAL | Status: DC

## 2011-11-23 NOTE — Telephone Encounter (Signed)
Xanax #60, 5 RF, per Dr. Drue Novel.  Done and sent to fax

## 2011-11-23 NOTE — Telephone Encounter (Signed)
Dr. Drue Novel patient...  Please advise

## 2011-11-23 NOTE — Telephone Encounter (Signed)
Xanax #60, % RF vicodin #60, 1 RF Other med no RF

## 2011-11-26 MED ORDER — MUPIROCIN 2 % EX OINT: TOPICAL_OINTMENT | CUTANEOUS | Status: DC

## 2011-11-26 NOTE — Telephone Encounter (Signed)
Ok 1 tube and 1 Rf

## 2011-11-27 ENCOUNTER — Other Ambulatory Visit: Payer: Self-pay | Admitting: Internal Medicine

## 2011-11-27 NOTE — Telephone Encounter (Signed)
Rx was sent to pharmacy on 11/13/11 for #30 with 3 refills

## 2011-11-30 ENCOUNTER — Other Ambulatory Visit: Payer: Self-pay | Admitting: Internal Medicine

## 2011-11-30 MED ORDER — DILTIAZEM HCL ER COATED BEADS 120 MG PO CP24: 120.0000 mg | ORAL_CAPSULE | Freq: Every day | ORAL | Status: DC

## 2011-11-30 NOTE — Telephone Encounter (Signed)
Rx  Last filled 11-13-11 #30 3  Spoke to pharmacy Rx not received Rx re-sent to pharmacy.

## 2011-12-20 ENCOUNTER — Other Ambulatory Visit: Payer: Self-pay | Admitting: Internal Medicine

## 2011-12-20 ENCOUNTER — Other Ambulatory Visit: Payer: Self-pay

## 2011-12-20 MED ORDER — POTASSIUM CHLORIDE CRYS ER 20 MEQ PO TBCR: EXTENDED_RELEASE_TABLET | ORAL | Status: DC

## 2011-12-20 MED ORDER — OMEPRAZOLE 40 MG PO CPDR: 40.0000 mg | DELAYED_RELEASE_CAPSULE | Freq: Every day | ORAL | Status: DC

## 2011-12-20 MED ORDER — HYDROCHLOROTHIAZIDE 25 MG PO TABS: 25.0000 mg | ORAL_TABLET | Freq: Every day | ORAL | Status: DC

## 2011-12-20 MED ORDER — DILTIAZEM HCL ER COATED BEADS 120 MG PO CP24: 120.0000 mg | ORAL_CAPSULE | Freq: Every day | ORAL | Status: DC

## 2011-12-20 NOTE — Telephone Encounter (Signed)
Last seen 09/26/11 and filled 11/13/11 # 60 with 1 refill. Please advise    KP

## 2011-12-20 NOTE — Telephone Encounter (Signed)
Labs current--Rx sent    KP

## 2011-12-21 NOTE — Telephone Encounter (Signed)
Patient made aware    KP 

## 2011-12-21 NOTE — Telephone Encounter (Signed)
Too early , needs to use the RF

## 2012-01-22 ENCOUNTER — Other Ambulatory Visit: Payer: Self-pay | Admitting: Internal Medicine

## 2012-01-23 MED ORDER — HYDROCODONE-ACETAMINOPHEN 10-660 MG PO TABS: ORAL_TABLET | ORAL | Status: DC

## 2012-01-23 NOTE — Telephone Encounter (Signed)
Refill done.  

## 2012-01-23 NOTE — Telephone Encounter (Signed)
Ok 60, 2 RF 

## 2012-01-23 NOTE — Telephone Encounter (Signed)
D.Paz please advise, last filled 11/23/11 #60/1, last OV 09-26-11

## 2012-01-29 ENCOUNTER — Encounter: Payer: Self-pay | Admitting: Internal Medicine

## 2012-01-29 ENCOUNTER — Ambulatory Visit (INDEPENDENT_AMBULATORY_CARE_PROVIDER_SITE_OTHER): Payer: 59 | Admitting: Internal Medicine

## 2012-01-29 ENCOUNTER — Other Ambulatory Visit: Payer: Self-pay | Admitting: Internal Medicine

## 2012-01-29 ENCOUNTER — Encounter: Payer: 59 | Admitting: Internal Medicine

## 2012-01-29 DIAGNOSIS — R972 Elevated prostate specific antigen [PSA]: Secondary | ICD-10-CM

## 2012-01-29 DIAGNOSIS — E785 Hyperlipidemia, unspecified: Secondary | ICD-10-CM

## 2012-01-29 DIAGNOSIS — I4891 Unspecified atrial fibrillation: Secondary | ICD-10-CM

## 2012-01-29 DIAGNOSIS — E079 Disorder of thyroid, unspecified: Secondary | ICD-10-CM

## 2012-01-29 DIAGNOSIS — L989 Disorder of the skin and subcutaneous tissue, unspecified: Secondary | ICD-10-CM

## 2012-01-29 DIAGNOSIS — Z0000 Encounter for general adult medical examination without abnormal findings: Secondary | ICD-10-CM | POA: Insufficient documentation

## 2012-01-29 DIAGNOSIS — F17200 Nicotine dependence, unspecified, uncomplicated: Secondary | ICD-10-CM

## 2012-01-29 DIAGNOSIS — I10 Essential (primary) hypertension: Secondary | ICD-10-CM

## 2012-01-29 LAB — BASIC METABOLIC PANEL
BUN: 21 mg/dL (ref 6–23)
Chloride: 104 mEq/L (ref 96–112)
Creatinine, Ser: 1.2 mg/dL (ref 0.4–1.5)

## 2012-01-29 LAB — LIPID PANEL
Cholesterol: 163 mg/dL (ref 0–200)
LDL Cholesterol: 115 mg/dL — ABNORMAL HIGH (ref 0–99)
Total CHOL/HDL Ratio: 4

## 2012-01-29 LAB — ALT: ALT: 23 U/L (ref 0–53)

## 2012-01-29 LAB — CBC WITH DIFFERENTIAL/PLATELET
Basophils Relative: 0.3 % (ref 0.0–3.0)
Eosinophils Relative: 2 % (ref 0.0–5.0)
Lymphocytes Relative: 32.3 % (ref 12.0–46.0)
MCV: 90.7 fl (ref 78.0–100.0)
Neutrophils Relative %: 58.6 % (ref 43.0–77.0)
RBC: 5 Mil/uL (ref 4.22–5.81)
WBC: 9.3 10*3/uL (ref 4.5–10.5)

## 2012-01-29 MED ORDER — METOPROLOL SUCCINATE ER 200 MG PO TB24: 200.0000 mg | ORAL_TABLET | Freq: Every day | ORAL | Status: DC

## 2012-01-29 MED ORDER — HYDROCHLOROTHIAZIDE 25 MG PO TABS: 25.0000 mg | ORAL_TABLET | Freq: Every day | ORAL | Status: DC

## 2012-01-29 MED ORDER — POTASSIUM CHLORIDE CRYS ER 20 MEQ PO TBCR: EXTENDED_RELEASE_TABLET | ORAL | Status: DC

## 2012-01-29 MED ORDER — ALPRAZOLAM 0.5 MG PO TABS: ORAL_TABLET | ORAL | Status: DC

## 2012-01-29 MED ORDER — DILTIAZEM HCL ER COATED BEADS 120 MG PO CP24: 120.0000 mg | ORAL_CAPSULE | Freq: Every day | ORAL | Status: DC

## 2012-01-29 NOTE — Telephone Encounter (Signed)
Refill done.  

## 2012-01-29 NOTE — Progress Notes (Signed)
Subjective:    Patient ID: Warren [name], male    DOB: Feb 10, 1946, 66 y.o.   MRN: [id number]  HPI Here for Medicare AWV: 1. Risk factors based on Past M, S, F history: recviewed  2. Physical Activities: reports walks 5 miles a day (by step counter) weather permit  3. Depression/mood: no major issues  4. Hearing: no problems reported or noted  5. ADL's: independent  6. Fall Risk: low risk, no recent falls  7. Home Safety: does feel safe at home  8. Height, weight, &visual acuity: gained wt ~ 3 pounds since last year ; uses glasses, has not                  seen the eye doctor, recommend  to do so 9. Counseling: provided, see below  10. Labs ordered based on risk factors: yes 11.       Referral Coordination-- if needed  12.       Care Plan-- see a/p  13.        Cognitive Assessment: cognition, motor skills and memory seem normal  in addition, we discussed the following  Hypertension--   ambulatory BPs weekly ususally wnl , good medication compliance  increased PSA-- never got to see urology  Atrial fibrillation -- good med compliance, denies palpitations   Back pain-- on hydrocodone as needed  insomia well controlled w/ xanax prn Has a skin lesion in the L leg that is not healing, occ clear d/c   Past Medical History: Hypertension increased PSA CV: ---Cardiac cath 2001: no CAD ----Supraventricular tachycardia in 2002.  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  He underwent radiofrequency ablation.  ---myoview 12-09: neg  ---02-2009: . Atrial fibrillation with rapid ventricular response Back Pain, sporadic  Past Surgical History: Appendectomy Cardiac cath 2001: no CAD status post ablation in 2002  myoview 12-09: neg  tonsillectomy   Family History: adopted  Social History: plays at the [employer] often and stays up all night twice a  week Single, lives by self , no kids tobacco-- yes, has cut back to 1 cigar a day ETOH-- rarely  Diet poor   Review of Systems  Respiratory: Negative for cough and shortness of breath.        Occ mucus production, no hemoptysis  Cardiovascular: Negative for chest pain and leg swelling.  Gastrointestinal: Negative for nausea, vomiting, abdominal pain and blood in stool.  Genitourinary: Negative for dysuria, frequency, hematuria and difficulty urinating.       Occ nocturia        Objective:   Physical Exam  Constitutional: He is oriented to person, place, and time. He appears well-developed. No distress.  Neck: No thyromegaly present.  Cardiovascular: Normal rate, regular rhythm and normal heart sounds.   No murmur heard. Pulmonary/Chest: Effort normal and breath sounds normal. No respiratory distress. He has no wheezes. He has no rales.  Genitourinary: Rectum normal and prostate normal. Guaiac negative stool.  Musculoskeletal: He exhibits no edema.  Neurological: He is alert and oriented to person, place, and time.  Skin: He is not diaphoretic.     Psychiatric: He has a normal mood and affect. His behavior is normal. Judgment and thought content normal.          Assessment & Plan:  Skin lesion, rec otc abx ointment and observation, if not better, needs to see derm Addendum, already doing  the abx----refer to derm, excision ?

## 2012-01-29 NOTE — Assessment & Plan Note (Addendum)
BP 140/86, no change

## 2012-01-29 NOTE — Assessment & Plan Note (Signed)
On diet only, labs 

## 2012-01-29 NOTE — Assessment & Plan Note (Signed)
Last TSH WNL 

## 2012-01-29 NOTE — Assessment & Plan Note (Addendum)
Due to see cards , pt will call

## 2012-01-29 NOTE — Assessment & Plan Note (Signed)
counseled

## 2012-01-29 NOTE — Patient Instructions (Signed)
Apply an antibiotic ointment at the place near the groin, call if no better in few weeks

## 2012-01-29 NOTE — Assessment & Plan Note (Addendum)
Td 09 pneumonia shot---completed shingles immunization--discussed before, has not taking it so far  Cscope --never, consistently declined to have one  hemocult neg today,  Issued a iFOB   Counseled about : diet, exercise, smoking cessation

## 2012-01-29 NOTE — Assessment & Plan Note (Signed)
Has consistently declined to see urology, he is asx, DRE  Normal: labs

## 2012-01-30 ENCOUNTER — Encounter: Payer: Self-pay | Admitting: *Deleted

## 2012-02-08 ENCOUNTER — Encounter: Payer: Self-pay | Admitting: *Deleted

## 2012-03-20 ENCOUNTER — Other Ambulatory Visit: Payer: Self-pay | Admitting: *Deleted

## 2012-03-20 DIAGNOSIS — Z0000 Encounter for general adult medical examination without abnormal findings: Secondary | ICD-10-CM

## 2012-04-08 ENCOUNTER — Telehealth: Payer: Self-pay | Admitting: Internal Medicine

## 2012-04-08 ENCOUNTER — Other Ambulatory Visit: Payer: Self-pay

## 2012-04-08 DIAGNOSIS — I4891 Unspecified atrial fibrillation: Secondary | ICD-10-CM

## 2012-04-08 MED ORDER — FLECAINIDE ACETATE 150 MG PO TABS: 150.0000 mg | ORAL_TABLET | Freq: Two times a day (BID) | ORAL | Status: DC

## 2012-04-08 NOTE — Telephone Encounter (Signed)
flecinide refill requested by pt's pharmacy, and pt has only today's pill left, needs refill asap, however his med list shows a line through the flecinide, is he to still be taking it? Pt requesting this be done asap because if he can get a refill he needs it called in in order to pick up by 5p

## 2012-04-08 NOTE — Telephone Encounter (Signed)
.   Requested Prescriptions   Signed Prescriptions Disp Refills  . flecainide (TAMBOCOR) 150 MG tablet 60 tablet 3    Sig: Take 1 tablet (150 mg total) by mouth 2 (two) times daily.    Authorizing Provider: Duke Salvia    Ordering User: Lacie Scotts

## 2012-04-08 NOTE — Telephone Encounter (Signed)
Refilled medication

## 2012-04-21 ENCOUNTER — Telehealth: Payer: Self-pay | Admitting: Internal Medicine

## 2012-04-21 MED ORDER — HYDROCODONE-ACETAMINOPHEN 10-660 MG PO TABS: ORAL_TABLET | ORAL | Status: DC

## 2012-04-21 MED ORDER — ALPRAZOLAM 0.5 MG PO TABS: ORAL_TABLET | ORAL | Status: DC

## 2012-04-21 NOTE — Telephone Encounter (Signed)
Ok 60 and 1 RF on each

## 2012-04-21 NOTE — Telephone Encounter (Signed)
Ok to refill 

## 2012-04-21 NOTE — Telephone Encounter (Signed)
Refill done.  

## 2012-04-21 NOTE — Telephone Encounter (Signed)
Refill: Alprazolam 0.5mg  tablet. Take 2 tablets at bedtime as needed for insomnia. Last fill 03-20-12. Hydrocodone-acetaminophen 10-660mg . Take one tablet twice daily. Last fill 03-20-12.

## 2012-05-12 ENCOUNTER — Ambulatory Visit: Payer: 59 | Admitting: Internal Medicine

## 2012-06-02 ENCOUNTER — Encounter: Payer: Self-pay | Admitting: Internal Medicine

## 2012-06-02 ENCOUNTER — Ambulatory Visit (INDEPENDENT_AMBULATORY_CARE_PROVIDER_SITE_OTHER): Payer: Medicare Other | Admitting: Internal Medicine

## 2012-06-02 VITALS — BP 120/82 | HR 68 | Resp 18 | Ht 70.0 in | Wt 263.8 lb

## 2012-06-02 DIAGNOSIS — I4891 Unspecified atrial fibrillation: Secondary | ICD-10-CM

## 2012-06-02 DIAGNOSIS — I10 Essential (primary) hypertension: Secondary | ICD-10-CM

## 2012-06-02 NOTE — Progress Notes (Signed)
.skf  HPI  Warren [name] is a 66 y.o. male ollowup for atrial fibrillation. He has hypertension and takes flecainide Cardizem and aspirin He has palpitations every month or 2. They typically last about 5-15 minutes.  He is walking 2 1/2 miles a day and is shooting for 3 miles daily  he struggles with insomnia I don't think he has sleep apnea  He continues to smoke his cigars and is not itneersted in stopping  He brings in a 15 inch cigar   The patient denies chest pain, shortness of breath, nocturnal dyspnea, orthopnea or peripheral edema.  There have been no palpitations, lightheadedness or syncope.    Past Medical History  Diagnosis Date  . Hypertension   . Supraventricular tachycardia 2002  . Atrial fibrillation 02-2009    Past Surgical History  Procedure Date  . Appendectomy   . Cardiac catheterization 2001    NO CAD  . Status post ablation 2007  . Tonsillectomy   . Nm myoview ltd     Current Outpatient Prescriptions  Medication Sig Dispense Refill  . ALPRAZolam (XANAX) 0.5 MG tablet Take 2 tablets at bedtime as needed for insomnia.  60 tablet  1  . aspirin 325 MG tablet Take 325 mg by mouth daily.      Marland Kitchen co-enzyme Q-10 30 MG capsule Take 30 mg by mouth 2 (two) times daily.        . COD LIVER OIL PO Take 2 tablets by mouth daily.       Marland Kitchen diltiazem (CARDIZEM CD) 120 MG 24 hr capsule Take 1 capsule (120 mg total) by mouth daily.  30 capsule  6  . fish oil-omega-3 fatty acids 1000 MG capsule Take 2 g by mouth daily.        . flecainide (TAMBOCOR) 150 MG tablet Take 1 tablet (150 mg total) by mouth 2 (two) times daily.  60 tablet  3  . hydrochlorothiazide (HYDRODIURIL) 25 MG tablet Take 1 tablet (25 mg total) by mouth daily.  30 tablet  6  . Hydrocodone-Acetaminophen 10-660 MG TABS Take 1 tablet by mouth 2 (two) times daily as needed.  60 each  1  . metoprolol (TOPROL-XL) 200 MG 24 hr tablet Take 1 tablet (200 mg total) by mouth daily.  30 tablet  6  . Multiple Vitamin  (MULTIVITAMIN) capsule Take 1 capsule by mouth daily.        . Multiple Vitamins-Minerals (CENTRUM SILVER PO) Take 1 each by mouth daily.        . nitroGLYCERIN (NITROSTAT) 0.4 MG SL tablet Place 0.4 mg under the tongue every 5 (five) minutes as needed.        Marland Kitchen omeprazole (PRILOSEC) 40 MG capsule Take 1 capsule (40 mg total) by mouth daily.  30 capsule  6  . potassium chloride SA (K-DUR,KLOR-CON) 20 MEQ tablet 3 tabs daily.  90 tablet  6  . TURMERIC PO Take 4 capsules by mouth daily.          No Known Allergies  Review of Systems negative except from HPI and PMH  Physical Exam BP 120/82  Pulse 68  Resp 18  Ht 5\' 10"  (1.778 m)  Wt 263 lb 12.8 oz (119.659 kg)  BMI 37.85 kg/m2 Well developed and well nourished an obese  in no acute distress HENT normal E scleral and icterus clear Neck Supple JVP flat; carotids brisk and full Clear to ausculation regular rate and rhythm Slow   no murmurs gallops or rub  Soft with active bowel sounds No clubbing cyanosis no Edema Alert and oriented, grossly normal motor and sensory function Skin Warm and Dry   ECG demonstrates sinus rhythm at 51 Intervals to 01/02/47 66 axis   Assessment and  Plan

## 2012-06-02 NOTE — Assessment & Plan Note (Signed)
Paroxsymal  Will decrease ASA to 81  Continue flec

## 2012-06-02 NOTE — Patient Instructions (Signed)
Your physician wants you to follow-up in: 1 year with Dr. Klein. You will receive a reminder letter in the mail two months in advance. If you don't receive a letter, please call our office to schedule the follow-up appointment.  Your physician recommends that you continue on your current medications as directed. Please refer to the Current Medication list given to you today.  

## 2012-06-02 NOTE — Assessment & Plan Note (Signed)
Well controlled 

## 2012-07-07 ENCOUNTER — Other Ambulatory Visit: Payer: Self-pay | Admitting: Internal Medicine

## 2012-07-07 NOTE — Telephone Encounter (Signed)
Last OV 06-02-12, last Filled 04-21-12 #60 1

## 2012-07-09 NOTE — Telephone Encounter (Signed)
Ok 60, 3 RF 

## 2012-07-09 NOTE — Telephone Encounter (Signed)
Refill done.  

## 2012-07-28 ENCOUNTER — Ambulatory Visit (INDEPENDENT_AMBULATORY_CARE_PROVIDER_SITE_OTHER): Payer: Medicare Other | Admitting: Internal Medicine

## 2012-07-28 ENCOUNTER — Encounter: Payer: Self-pay | Admitting: Internal Medicine

## 2012-07-28 VITALS — BP 138/84 | HR 44 | Temp 97.5°F | Wt 257.0 lb

## 2012-07-28 DIAGNOSIS — F17200 Nicotine dependence, unspecified, uncomplicated: Secondary | ICD-10-CM

## 2012-07-28 DIAGNOSIS — I4891 Unspecified atrial fibrillation: Secondary | ICD-10-CM

## 2012-07-28 DIAGNOSIS — I10 Essential (primary) hypertension: Secondary | ICD-10-CM

## 2012-07-28 LAB — MAGNESIUM: Magnesium: 2 mg/dL (ref 1.5–2.5)

## 2012-07-28 LAB — BASIC METABOLIC PANEL
BUN: 21 mg/dL (ref 6–23)
Chloride: 104 mEq/L (ref 96–112)
GFR: 48.8 mL/min — ABNORMAL LOW (ref >60.00)
Glucose, Bld: 88 mg/dL (ref 70–99)
Potassium: 3.9 mEq/L (ref 3.5–5.1)
Sodium: 141 mEq/L (ref 135–145)

## 2012-07-28 NOTE — Assessment & Plan Note (Signed)
Seems to be stable, closely followed by cardiology 

## 2012-07-28 NOTE — Progress Notes (Signed)
  Subjective:    Patient ID: Warren [name], male    DOB: Sep 24, 1946, 66 y.o.   MRN: [id number]  HPI Routine office visit Hypertension, good medication compliance, he takes his BP weekly, 130/s 80-85 SVT, chart reviewed, saw cardiology 05/2012, felt to be stable, he continue with aspirin and flecainide   Past Medical History: Hypertension increased PSA CV: ---Cardiac cath 2001: no CAD ----Supraventricular tachycardia in 2002.  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  He underwent radiofrequency ablation.   ---myoview 12-09: neg   ---02-2009: . Atrial fibrillation with rapid ventricular response Back Pain, sporadic  Past Surgical History: Appendectomy Cardiac cath 2001: no CAD status post ablation in 2002   myoview 12-09: neg   tonsillectomy    Family History: adopted  Social History: Single, lives by self , no kids tobacco-- yes, has cut back to 1 cigar a day ETOH-- rarely      Review of Systems In general feels well, denies chest pain or shortness of breath but admits to bringing up phlegm most mornings. No hemoptysis. No wheezing.     Objective:   Physical Exam General -- alert, well-developed, and overweight appearing. No apparent distress.  Lungs -- normal respiratory effort, no intercostal retractions, no accessory muscle use, and decreased breath sounds.   Heart-- normal rate, regular rhythm, no murmur, and no gallop.   Extremities-- no pretibial edema bilaterally  Neurologic-- alert & oriented X3 and strength normal in all extremities. Psych-- Cognition and judgment appear intact. Alert and cooperative with normal attention span and concentration.  not anxious appearing and not depressed appearing.       Assessment & Plan:

## 2012-07-28 NOTE — Assessment & Plan Note (Signed)
Ongoing issue, counseled.  produces sputum most days, check a PFT for baseline

## 2012-07-28 NOTE — Assessment & Plan Note (Signed)
Seems to be well-controlled, will check electrolytes

## 2012-07-29 ENCOUNTER — Encounter: Payer: Self-pay | Admitting: *Deleted

## 2012-08-03 ENCOUNTER — Other Ambulatory Visit: Payer: Self-pay | Admitting: Internal Medicine

## 2012-08-06 ENCOUNTER — Other Ambulatory Visit: Payer: Self-pay | Admitting: Internal Medicine

## 2012-08-06 DIAGNOSIS — I4891 Unspecified atrial fibrillation: Secondary | ICD-10-CM

## 2012-08-06 MED ORDER — FLECAINIDE ACETATE 150 MG PO TABS: 150.0000 mg | ORAL_TABLET | Freq: Two times a day (BID) | ORAL | Status: DC

## 2012-09-19 ENCOUNTER — Other Ambulatory Visit: Payer: Self-pay | Admitting: Internal Medicine

## 2012-09-19 MED ORDER — DILTIAZEM HCL ER COATED BEADS 120 MG PO CP24: 120.0000 mg | ORAL_CAPSULE | Freq: Every day | ORAL | Status: DC

## 2012-09-19 NOTE — Telephone Encounter (Signed)
Refill done.  

## 2012-09-19 NOTE — Telephone Encounter (Signed)
DILT XR 120 MG CAPSULE LAST REFILL: 08/26/12 QTY:30 TAKE ONE CAPSULE EACH DAY

## 2012-10-17 ENCOUNTER — Telehealth: Payer: Self-pay | Admitting: Internal Medicine

## 2012-10-17 MED ORDER — HYDROCODONE-ACETAMINOPHEN 10-660 MG PO TABS: 1.0000 | ORAL_TABLET | Freq: Two times a day (BID) | ORAL | Status: DC | PRN

## 2012-10-17 NOTE — Telephone Encounter (Signed)
Done

## 2012-10-17 NOTE — Telephone Encounter (Signed)
Ok to refill 

## 2012-10-17 NOTE — Telephone Encounter (Signed)
Refill: Hydrocodone-acetaminophen 10-660 mg. Take 1 tablet by mouth twice a day if needed for pain. Last fill 8.14.13

## 2012-10-19 ENCOUNTER — Other Ambulatory Visit: Payer: Self-pay | Admitting: Internal Medicine

## 2012-10-20 NOTE — Telephone Encounter (Signed)
Refill done.  

## 2012-10-29 ENCOUNTER — Telehealth: Payer: Self-pay

## 2012-10-29 MED ORDER — POTASSIUM CHLORIDE CRYS ER 20 MEQ PO TBCR: EXTENDED_RELEASE_TABLET | ORAL | Status: DC

## 2012-10-29 NOTE — Telephone Encounter (Signed)
Pt verified pharmacy Rx faxed.     MW

## 2012-11-17 ENCOUNTER — Other Ambulatory Visit: Payer: Self-pay | Admitting: Internal Medicine

## 2012-11-18 NOTE — Telephone Encounter (Signed)
Done

## 2012-11-18 NOTE — Telephone Encounter (Signed)
Ok to refill 

## 2012-12-25 ENCOUNTER — Other Ambulatory Visit: Payer: Self-pay | Admitting: Internal Medicine

## 2012-12-25 MED ORDER — METOPROLOL SUCCINATE ER 200 MG PO TB24: 200.0000 mg | ORAL_TABLET | Freq: Every day | ORAL | Status: DC

## 2012-12-25 NOTE — Telephone Encounter (Signed)
Refill done.  

## 2012-12-25 NOTE — Telephone Encounter (Signed)
refill Metoprolol Succinate (Tablet SR 24 hr) TOPROL-XL 200 MG Take 1 tablet (200 mg total) by mouth daily. #30 wt/6-refills last fill 11.25.13

## 2013-01-28 ENCOUNTER — Encounter: Payer: Self-pay | Admitting: Lab

## 2013-01-29 ENCOUNTER — Encounter: Payer: Self-pay | Admitting: Internal Medicine

## 2013-01-29 ENCOUNTER — Ambulatory Visit (INDEPENDENT_AMBULATORY_CARE_PROVIDER_SITE_OTHER): Payer: Medicare Other | Admitting: Internal Medicine

## 2013-01-29 VITALS — BP 136/84 | HR 48 | Temp 97.7°F | Ht 70.5 in | Wt 270.0 lb

## 2013-01-29 DIAGNOSIS — I10 Essential (primary) hypertension: Secondary | ICD-10-CM

## 2013-01-29 DIAGNOSIS — Z0000 Encounter for general adult medical examination without abnormal findings: Secondary | ICD-10-CM

## 2013-01-29 DIAGNOSIS — R972 Elevated prostate specific antigen [PSA]: Secondary | ICD-10-CM

## 2013-01-29 DIAGNOSIS — M549 Dorsalgia, unspecified: Secondary | ICD-10-CM

## 2013-01-29 DIAGNOSIS — I4891 Unspecified atrial fibrillation: Secondary | ICD-10-CM

## 2013-01-29 DIAGNOSIS — F17200 Nicotine dependence, unspecified, uncomplicated: Secondary | ICD-10-CM

## 2013-01-29 DIAGNOSIS — F519 Sleep disorder not due to a substance or known physiological condition, unspecified: Secondary | ICD-10-CM

## 2013-01-29 LAB — CBC WITH DIFFERENTIAL/PLATELET
Basophils Absolute: 0.1 10*3/uL (ref 0.0–0.1)
Eosinophils Absolute: 0.2 10*3/uL (ref 0.0–0.7)
HCT: 45.8 % (ref 39.0–52.0)
Hemoglobin: 15.3 g/dL (ref 13.0–17.0)
Lymphs Abs: 2.7 10*3/uL (ref 0.7–4.0)
MCHC: 33.3 g/dL (ref 30.0–36.0)
Monocytes Absolute: 0.7 10*3/uL (ref 0.1–1.0)
Neutro Abs: 5.1 10*3/uL (ref 1.4–7.7)
Platelets: 166 10*3/uL (ref 150.0–400.0)
RDW: 13.9 % (ref 11.5–14.6)

## 2013-01-29 LAB — COMPREHENSIVE METABOLIC PANEL
AST: 22 U/L (ref 0–37)
Albumin: 3.8 g/dL (ref 3.5–5.2)
Alkaline Phosphatase: 43 U/L (ref 39–117)
BUN: 25 mg/dL — ABNORMAL HIGH (ref 6–23)
Calcium: 9 mg/dL (ref 8.4–10.5)
Chloride: 101 mEq/L (ref 96–112)
Creatinine, Ser: 1.4 mg/dL (ref 0.4–1.5)
Glucose, Bld: 94 mg/dL (ref 70–99)
Potassium: 4 mEq/L (ref 3.5–5.1)

## 2013-01-29 LAB — PSA: PSA: 5.1 ng/mL — ABNORMAL HIGH (ref 0.10–4.00)

## 2013-01-29 LAB — LIPID PANEL
Cholesterol: 156 mg/dL (ref 0–200)
HDL: 34.3 mg/dL — ABNORMAL LOW (ref >39.00)
Triglycerides: 115 mg/dL (ref 0.0–149.0)

## 2013-01-29 MED ORDER — ZOSTER VACCINE LIVE 19400 UNT/0.65ML ~~LOC~~ SOLR: 0.6500 mL | Freq: Once | SUBCUTANEOUS | Status: DC

## 2013-01-29 NOTE — Assessment & Plan Note (Signed)
Seems to be well-controlled. Labs

## 2013-01-29 NOTE — Assessment & Plan Note (Addendum)
Risks discussed , specifically rec to see dentist as he smokes cigars and is at a increase risk of oral cancer

## 2013-01-29 NOTE — Assessment & Plan Note (Signed)
Well controlled, labs  

## 2013-01-29 NOTE — Assessment & Plan Note (Signed)
Symptoms well-controlled with one or 2 hydrocodone tablets a day. Refill as needed

## 2013-01-29 NOTE — Progress Notes (Signed)
  Subjective:    Patient ID: Warren [name], male    DOB: Oct 24, 1946, 67 y.o.   MRN: [id number]  HPI  Here for Medicare AWV: 1. Risk factors based on Past M, S, F history: recviewed   2. Physical Activities: trying to walk ~5 miles a day (by step counter)    3. Depression/mood: (-) screening    4. Hearing: no problems reported or noted   5. ADL: independent    6.  Fall Risk: low risk, no recent falls   7.  Home Safety: does feel safe at home   8. Height, weight, &visual acuity: uses glasses, has not seen the eye doctor, recommend  to do so 9.  Counseling: provided, see below   10. Labs ordered based on risk factors: yes 11.  Referral Coordination-- if needed   12.  Care Plan-- see a/p   13. Cognitive Assessment: cognition, motor skills and memory seem normal  in addition, we discussed the following: Back pain, well-controlled with hydrocodone one or 2 tablets daily as needed SVT, good compliance with medications as prescribed by cardiology Hypertension, good compliance with meds, ambulatory BPs always less than 130/85 Insomnia, uses Xanax when necessary, admits that has a very disorganized sleep schedule  Past Medical History: Hypertension increased PSA CV: ---Cardiac cath 2001: no CAD ----Supraventricular tachycardia in 2002.  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  He underwent radiofrequency ablation.   ---myoview 12-09: neg   ---02-2009: . Atrial fibrillation with rapid ventricular response Back Pain, sporadic  Past Surgical History: Appendectomy Cardiac cath 2001: no CAD status post ablation in 2002   myoview 12-09: neg   tonsillectomy    Family History: adopted  Social History: Single, lives by self , no kids. Essentially has no family. 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 ETOH-- rarely     Review of Systems Denies chest pain or shortness or breath. No palpitation or lower extremity edema. No nausea, vomiting, diarrhea or blood in the stools. No dysuria or gross hematuria.     Objective:   Physical Exam General -- alert, well-developed,  BMI 39.   Neck --no thyromegaly Lungs -- normal respiratory effort, no intercostal retractions, no accessory muscle use, and normal breath sounds.   Heart-- normal rate, regular rhythm, no murmur, and no gallop.   Abdomen--soft, non-tender, no distention, no masses, no HSM, no guarding, and no rigidity.   Extremities-- no pretibial edema bilaterally Rectal-- No external abnormalities noted. Normal sphincter tone. No rectal masses or tenderness. Brown stool, Hemoccult negative Prostate:  Prostate gland firm and smooth, no enlargement, nodularity, tenderness, mass, asymmetry or induration. Neurologic-- alert & oriented X3 and strength normal in all extremities. Psych-- Cognition and judgment appear intact. Alert and cooperative with normal attention span and concentration.  not anxious appearing and not depressed appearing.       Assessment & Plan:

## 2013-01-29 NOTE — Patient Instructions (Signed)

## 2013-01-29 NOTE — Assessment & Plan Note (Signed)
Td 09 pneumonia shot- 2011 shingles immunization--discussed benefits, rx provided Never had a Cscope , consistently declined to have one , hemocult neg today,  Issued a iFOB   Counseled about : diet, exercise, smoking cessation, also rec a to have a health care POA

## 2013-01-29 NOTE — Assessment & Plan Note (Signed)
On alprazolam as needed, recognizes he has quite disorganize sleep habit

## 2013-01-29 NOTE — Assessment & Plan Note (Signed)
DRE normal, check a PSA, offered a urology referral

## 2013-02-02 ENCOUNTER — Encounter: Payer: Self-pay | Admitting: *Deleted

## 2013-02-04 ENCOUNTER — Telehealth: Payer: Self-pay | Admitting: Physician Assistant

## 2013-02-04 NOTE — Telephone Encounter (Signed)
Pt called regarding prescription refill. The office is closed due to inclement weather. He needs his flecainide refilled. No recent palpitations, syncope, chest pain or any other symptoms. Given that this is a long term medicine managed by Dr. Graciela Husbands, I will fill short term for 30 day script in case any other monitoring was desired before his next f/u appt. Will continue present dose. The patient was instructed to call ofc when we reopen on Monday to discuss. I called in flecainide 150mg  BID 60 tabs to [facility] with 0 refills. The patient verbalized understanding and gratitude. Haddon Fyfe PA-C

## 2013-02-24 ENCOUNTER — Telehealth: Payer: Self-pay | Admitting: Internal Medicine

## 2013-02-25 NOTE — Telephone Encounter (Signed)
Ok to refill? Last OV 2.6.14 Last filled 10.25.13

## 2013-02-25 NOTE — Telephone Encounter (Signed)
done

## 2013-03-06 MED ORDER — HYDROCODONE-ACETAMINOPHEN 10-325 MG PO TABS: 1.0000 | ORAL_TABLET | Freq: Two times a day (BID) | ORAL | Status: DC | PRN

## 2013-03-06 NOTE — Addendum Note (Signed)
Addended by: Verdene Rio on: 03/06/2013 03:39 PM   Modules accepted: Orders

## 2013-03-06 NOTE — Telephone Encounter (Signed)
Fax from pharmacy indicated that the highest dose of acetaminophen available 325 mg. Per Dr Drue Novel ok to change to 10-325 mg. Rx faxed

## 2013-03-10 ENCOUNTER — Encounter: Payer: Self-pay | Admitting: Internal Medicine

## 2013-03-25 ENCOUNTER — Other Ambulatory Visit: Payer: Self-pay | Admitting: Internal Medicine

## 2013-03-26 NOTE — Telephone Encounter (Signed)
Refill done.  

## 2013-03-28 ENCOUNTER — Telehealth: Payer: Self-pay | Admitting: Internal Medicine

## 2013-03-30 NOTE — Telephone Encounter (Signed)
done

## 2013-03-30 NOTE — Telephone Encounter (Signed)
Ok to refill? Last OV 2.6.14 Last filled 11.25.13

## 2013-04-27 ENCOUNTER — Other Ambulatory Visit: Payer: Self-pay | Admitting: Internal Medicine

## 2013-04-27 NOTE — Telephone Encounter (Signed)
Refill done.  

## 2013-05-25 ENCOUNTER — Other Ambulatory Visit: Payer: Self-pay | Admitting: Internal Medicine

## 2013-05-26 NOTE — Telephone Encounter (Signed)
Refill done.  

## 2013-07-24 ENCOUNTER — Other Ambulatory Visit: Payer: Self-pay | Admitting: Internal Medicine

## 2013-07-24 NOTE — Telephone Encounter (Signed)
Refill done.  

## 2013-07-27 ENCOUNTER — Other Ambulatory Visit: Payer: Self-pay | Admitting: *Deleted

## 2013-07-27 DIAGNOSIS — I4891 Unspecified atrial fibrillation: Secondary | ICD-10-CM

## 2013-07-27 MED ORDER — FLECAINIDE ACETATE 150 MG PO TABS: 150.0000 mg | ORAL_TABLET | Freq: Two times a day (BID) | ORAL | Status: DC

## 2013-07-29 ENCOUNTER — Ambulatory Visit (INDEPENDENT_AMBULATORY_CARE_PROVIDER_SITE_OTHER): Payer: Medicare Other | Admitting: Internal Medicine

## 2013-07-29 VITALS — BP 120/80 | HR 49 | Temp 97.8°F | Wt 265.0 lb

## 2013-07-29 DIAGNOSIS — I4891 Unspecified atrial fibrillation: Secondary | ICD-10-CM

## 2013-07-29 DIAGNOSIS — F519 Sleep disorder not due to a substance or known physiological condition, unspecified: Secondary | ICD-10-CM

## 2013-07-29 DIAGNOSIS — I10 Essential (primary) hypertension: Secondary | ICD-10-CM

## 2013-07-29 DIAGNOSIS — M549 Dorsalgia, unspecified: Secondary | ICD-10-CM

## 2013-07-29 LAB — BASIC METABOLIC PANEL
BUN: 18 mg/dL (ref 6–23)
Calcium: 9.1 mg/dL (ref 8.4–10.5)
GFR: 49.79 mL/min — ABNORMAL LOW (ref >60.00)
Potassium: 3.8 mEq/L (ref 3.5–5.1)
Sodium: 140 mEq/L (ref 135–145)

## 2013-07-29 NOTE — Assessment & Plan Note (Signed)
Seems stable, heart rate 49, asymptomatic. Recommend to call cardiology he is due for a visit

## 2013-07-29 NOTE — Patient Instructions (Addendum)
Continue your medications as recommended. You are overdue to see the heart doctor please call [phone] and schedule an appointment Next visit in 6 months, fasting for a physical exam to please make an appointment

## 2013-07-29 NOTE — Assessment & Plan Note (Signed)
On hydrocodone, needs a UDS; symptoms well-controlled with one or 2 tablets daily

## 2013-07-29 NOTE — Assessment & Plan Note (Signed)
Well controlled, BMP

## 2013-07-29 NOTE — Assessment & Plan Note (Signed)
On xanax, sx well controlled, needs UDS

## 2013-07-29 NOTE — Progress Notes (Signed)
  Subjective:    Patient ID: Warren [name], male    DOB: 03-09-1946, 67 y.o.   MRN: [id number]  HPI Six-month followup Back pain, symptoms well-controlled with hydrocodone 1 or 2 tablets daily. Hypertension, very good reading today, ambulatory BPs around 120/80. Good medication compliance. Insomnia, well-controlled on Xanax although he recognizes he needs very little sleep to feel well. Past Medical History: Hypertension increased PSA CV: ---Cardiac cath 2001: no CAD ----Supraventricular tachycardia in 2002.  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  He underwent radiofrequency ablation.   ---myoview 12-09: neg   ---02-2009: . Atrial fibrillation with rapid ventricular response Back Pain, sporadic  Past Surgical History: Appendectomy Cardiac cath 2001: no CAD status post ablation in 2002   myoview 12-09: neg   tonsillectomy    Family History: adopted  Social History: Single, lives by self , no kids. Essentially has no family. 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 ETOH-- rarely     Review of Systems Denies chest pain, shortness or breath, palpitations. Denies feeling dizzy when he stands up. No nausea, vomiting, diarrhea. No blood in the stools. Insect bites? Bed bugs? From time to time notice red bumps that itch mostly in the legs.     Objective:   Physical Exam BP 120/80  Pulse 49  Temp(Src) 97.8 F (36.6 C) (Oral)  Wt 265 lb (120.203 kg)  BMI 37.47 kg/m2  SpO2 95% General -- alert, well-developed, NAD  Lungs -- normal respiratory effort, no intercostal retractions, no accessory muscle use, and normal breath sounds.   Heart-- Bradycardic   Extremities-- no pretibial edema bilaterally Skin-- 2 or 3 scattered  red lesions on the leg, slt red and  macular Psych-- Cognition and judgment appear intact. Alert and cooperative with normal attention span and concentration.  not anxious appearing and not depressed appearing.       Assessment & Plan:   Bed bugs? Recommend topical treatment with antibiotic ointments if needed, call if sx increase

## 2013-07-30 ENCOUNTER — Other Ambulatory Visit: Payer: Self-pay

## 2013-07-30 ENCOUNTER — Telehealth: Payer: Self-pay | Admitting: *Deleted

## 2013-07-30 ENCOUNTER — Encounter: Payer: Self-pay | Admitting: Internal Medicine

## 2013-07-30 DIAGNOSIS — I4891 Unspecified atrial fibrillation: Secondary | ICD-10-CM

## 2013-07-30 MED ORDER — FLECAINIDE ACETATE 150 MG PO TABS: 150.0000 mg | ORAL_TABLET | Freq: Two times a day (BID) | ORAL | Status: DC

## 2013-07-30 NOTE — Telephone Encounter (Signed)
Patient call transferred directly to the nurse room. Called in needing refills on flecainide 150 mg BID. States he saw Dr. Drue Novel and he would not refill. Per the patient, his medication has been sent to Dr. Graciela Husbands in [facility] and was denied. I explained to the patient that I will refill his medication, but he is overdue for follow up with Dr. Graciela Husbands. I have scheduled him to see Dr. Graciela Husbands on 8/19 at 4:00 pm due to bradycardia that he is having. HR at Dr. Leta Jungling office this week was 49. The patient reports he is in the 30's sometimes. He does not report any symptoms with this. I explained his medication may need to be adjusted.

## 2013-08-03 ENCOUNTER — Telehealth: Payer: Self-pay | Admitting: Internal Medicine

## 2013-08-03 NOTE — Telephone Encounter (Signed)
Patient is calling back for test results. States he had a missed call.

## 2013-08-03 NOTE — Telephone Encounter (Signed)
Discussed with patient, verbalized understanding.  

## 2013-08-11 ENCOUNTER — Ambulatory Visit (INDEPENDENT_AMBULATORY_CARE_PROVIDER_SITE_OTHER): Payer: Medicare Other | Admitting: Internal Medicine

## 2013-08-11 VITALS — BP 128/78 | HR 42 | Ht 70.0 in | Wt 265.1 lb

## 2013-08-11 DIAGNOSIS — I4891 Unspecified atrial fibrillation: Secondary | ICD-10-CM

## 2013-08-11 NOTE — Assessment & Plan Note (Signed)
He has paroxysmal atrial fibrillation. His QRS duration is not significantly prolonged on flecainide.  He remains disinclined to pursue anticoagulation other than aspirin.

## 2013-08-11 NOTE — Progress Notes (Signed)
Patient Care Team: Wanda Plump, MD as PCP - General   HPI  Warren [name] is a 67 y.o. male Seen in followup for atrial fibrillation in the context of hypertension. He takes flecainide Cardizem and aspirin.  His his CHADS-  2 score is one and his CHADS-VASc score is 2.  He has no complaints of exercise intolerance edema or fatigue.  Past Medical History  Diagnosis Date  . Hypertension   . Supraventricular tachycardia 2002  . Atrial fibrillation 02-2009    Past Surgical History  Procedure Laterality Date  . Appendectomy    . Cardiac catheterization  2001    NO CAD  . Status post ablation  2007  . Tonsillectomy    . Nm myoview ltd      Current Outpatient Prescriptions  Medication Sig Dispense Refill  . ALPRAZolam (XANAX) 0.5 MG tablet take 2 tablets by mouth at bedtime if needed for insomnia  60 tablet  3  . aspirin 81 MG tablet Take 81 mg by mouth 2 (two) times daily.      Marland Kitchen co-enzyme Q-10 30 MG capsule Take 30 mg by mouth daily.       . COD LIVER OIL PO Take 2 tablets by mouth daily.       Marland Kitchen diltiazem (CARDIZEM CD) 120 MG 24 hr capsule take 1 capsule by mouth once daily  30 capsule  6  . fish oil-omega-3 fatty acids 1000 MG capsule Take 2 g by mouth daily.        . flecainide (TAMBOCOR) 150 MG tablet Take 1 tablet (150 mg total) by mouth 2 (two) times daily.  60 tablet  3  . hydrochlorothiazide (HYDRODIURIL) 25 MG tablet take 1 tablet by mouth once daily  30 tablet  6  . HYDROcodone-acetaminophen (NORCO) 10-325 MG per tablet Take 1 tablet by mouth 2 (two) times daily as needed for pain.  60 tablet  5  . KLOR-CON M20 20 MEQ tablet take 3 tablets by mouth once daily  90 tablet  6  . metoprolol (TOPROL-XL) 200 MG 24 hr tablet take 1 tablet by mouth once daily  30 tablet  5  . Multiple Vitamins-Minerals (CENTRUM SILVER PO) Take 1 each by mouth daily.        . nitroGLYCERIN (NITROSTAT) 0.4 MG SL tablet Place 0.4 mg under the tongue every 5 (five) minutes as needed.        Marland Kitchen  omeprazole (PRILOSEC) 40 MG capsule take 1 capsule by mouth once daily  30 capsule  6  . TURMERIC PO Take 4 capsules by mouth daily.         No current facility-administered medications for this visit.    No Known Allergies  Review of Systems negative except from HPI and PMH  Physical Exam BP 128/78  Pulse 42  Ht 5\' 10"  (1.778 m)  Wt 265 lb 2 oz (120.26 kg)  BMI 38.04 kg/m2 Well developed and well nourished in no acute distress HENT normal E scleral and icterus clear Neck Supple JVP flat; carotids brisk and full Clear to ausculation  Regular rate and rhythm, no murmurs gallops or rub Soft with active bowel sounds No clubbing cyanosis nonea Edema Alert and oriented, grossly normal motor and sensory function Skin Warm and Dry  ECG demonstrates sinus rhythm at 42 Intervals 22/10/53 with a QTC of 44 Axis LIX   Assessment and  Plan

## 2013-09-04 ENCOUNTER — Encounter: Payer: Self-pay | Admitting: Internal Medicine

## 2013-09-21 ENCOUNTER — Other Ambulatory Visit: Payer: Self-pay | Admitting: Internal Medicine

## 2013-09-22 NOTE — Telephone Encounter (Signed)
rx refilled per protocol. DJR  

## 2013-09-24 ENCOUNTER — Telehealth: Payer: Self-pay | Admitting: *Deleted

## 2013-09-24 MED ORDER — HYDROCODONE-ACETAMINOPHEN 10-325 MG PO TABS: 1.0000 | ORAL_TABLET | Freq: Two times a day (BID) | ORAL | Status: DC | PRN

## 2013-09-24 NOTE — Telephone Encounter (Signed)
Pt requesting refill on HYDROcodone-acetaminophen (NORCO) 10-325 MG. Pt was last seen on 08/11/2013. Rx last filled on 03/06/2013 #60, 5 refills. UDS on 07/29/2013-low risk, Contract on file. Please advise. SW, CMA

## 2013-09-24 NOTE — Telephone Encounter (Signed)
RF done. .

## 2013-10-18 ENCOUNTER — Other Ambulatory Visit: Payer: Self-pay | Admitting: Internal Medicine

## 2013-10-19 ENCOUNTER — Telehealth: Payer: Self-pay | Admitting: *Deleted

## 2013-10-19 MED ORDER — ALPRAZOLAM 0.5 MG PO TABS: ORAL_TABLET | ORAL | Status: DC

## 2013-10-19 NOTE — Telephone Encounter (Signed)
rx refill request- XANAX 0.5mg  Last OV- 08/11/13 Last refilled- #60 / 3 rf  UDS Low- 07/29/13   please advise. DJR  

## 2013-10-19 NOTE — Addendum Note (Signed)
Addended by: Willow Ora E on: 10/19/2013 10:19 AM   Modules accepted: Orders

## 2013-10-19 NOTE — Telephone Encounter (Signed)
rx refill request- XANAX 0.5mg   Last OV- 08/11/13 Last refilled- #60 / 3 rf  UDS Low- 07/29/13   please advise. DJR

## 2013-10-19 NOTE — Telephone Encounter (Signed)
done

## 2013-11-22 ENCOUNTER — Other Ambulatory Visit: Payer: Self-pay | Admitting: Internal Medicine

## 2013-11-23 NOTE — Telephone Encounter (Signed)
Diltiazem refilled per protocol

## 2013-11-24 ENCOUNTER — Telehealth: Payer: Self-pay | Admitting: *Deleted

## 2013-11-24 MED ORDER — HYDROCODONE-ACETAMINOPHEN 10-325 MG PO TABS: 1.0000 | ORAL_TABLET | Freq: Two times a day (BID) | ORAL | Status: DC | PRN

## 2013-11-24 NOTE — Telephone Encounter (Signed)
Prescription for #180 no refills printed

## 2013-11-24 NOTE — Telephone Encounter (Signed)
Patient is requesting refill for hydrocodone  Last seen-07/29/2013  Last filled-09/24/2013  UDS-07/29/2013 low risk, contract signed  Please advise. SW

## 2013-11-30 ENCOUNTER — Other Ambulatory Visit: Payer: Self-pay

## 2013-11-30 ENCOUNTER — Telehealth: Payer: Self-pay | Admitting: Internal Medicine

## 2013-11-30 DIAGNOSIS — I4891 Unspecified atrial fibrillation: Secondary | ICD-10-CM

## 2013-11-30 MED ORDER — FLECAINIDE ACETATE 150 MG PO TABS: 150.0000 mg | ORAL_TABLET | Freq: Two times a day (BID) | ORAL | Status: DC

## 2013-11-30 NOTE — Telephone Encounter (Signed)
New problem    Pt called about refill for flecainde.

## 2013-12-18 ENCOUNTER — Other Ambulatory Visit: Payer: Self-pay | Admitting: Internal Medicine

## 2013-12-18 NOTE — Telephone Encounter (Signed)
Potassium chloride refilled per protocol. JG//CMA 

## 2014-01-18 ENCOUNTER — Other Ambulatory Visit: Payer: Self-pay | Admitting: Internal Medicine

## 2014-01-18 NOTE — Telephone Encounter (Signed)
Metoprolol refilled per protocol. JG//CMA 

## 2014-02-02 ENCOUNTER — Encounter: Payer: Medicare Other | Admitting: Internal Medicine

## 2014-03-09 ENCOUNTER — Telehealth: Payer: Self-pay

## 2014-03-09 MED ORDER — HYDROCODONE-ACETAMINOPHEN 10-325 MG PO TABS: 1.0000 | ORAL_TABLET | Freq: Two times a day (BID) | ORAL | Status: DC | PRN

## 2014-03-09 NOTE — Telephone Encounter (Signed)
Request 90 day supply Norco 10/325  Last refill 11/24/2013 UDS 07/2013 low risk Contract 02/2013

## 2014-03-09 NOTE — Telephone Encounter (Signed)
Okay to refill, he is due for a complete physical, please be sure he has one scheduled

## 2014-03-12 NOTE — Telephone Encounter (Signed)
Pt picked up rx at front desk. Pt scheduled CPE.

## 2014-03-18 ENCOUNTER — Other Ambulatory Visit: Payer: Self-pay

## 2014-03-18 DIAGNOSIS — I4891 Unspecified atrial fibrillation: Secondary | ICD-10-CM

## 2014-03-18 MED ORDER — FLECAINIDE ACETATE 150 MG PO TABS: 150.0000 mg | ORAL_TABLET | Freq: Two times a day (BID) | ORAL | Status: DC

## 2014-04-20 ENCOUNTER — Telehealth: Payer: Self-pay

## 2014-04-20 NOTE — Telephone Encounter (Addendum)
Left message for call back Non identifiable  Medication List and allergies:  Reviewed and updated  90 day supply/mail order: na Local prescriptions: [employer] [facility] [employer]             [facility] [address]   Immunizations due: needs flu in the fall  A/P:   No changes to FH, PSH or Personal Hx Tdap--09/2008 PNA--11/2010 CCS--never had PSA--01/2013--5.10 (referral refused)  To Discuss with Provider: States that he is taking 3-4 alprazolam a night to sleep!!

## 2014-04-21 ENCOUNTER — Encounter: Payer: Self-pay | Admitting: Internal Medicine

## 2014-04-21 ENCOUNTER — Ambulatory Visit (INDEPENDENT_AMBULATORY_CARE_PROVIDER_SITE_OTHER): Payer: Medicare Other | Admitting: Internal Medicine

## 2014-04-21 ENCOUNTER — Telehealth: Payer: Self-pay | Admitting: Internal Medicine

## 2014-04-21 VITALS — BP 139/82 | HR 55 | Temp 98.3°F | Ht 70.1 in | Wt 271.0 lb

## 2014-04-21 DIAGNOSIS — Z0000 Encounter for general adult medical examination without abnormal findings: Secondary | ICD-10-CM

## 2014-04-21 DIAGNOSIS — E079 Disorder of thyroid, unspecified: Secondary | ICD-10-CM

## 2014-04-21 DIAGNOSIS — R972 Elevated prostate specific antigen [PSA]: Secondary | ICD-10-CM

## 2014-04-21 DIAGNOSIS — Z23 Encounter for immunization: Secondary | ICD-10-CM

## 2014-04-21 DIAGNOSIS — E785 Hyperlipidemia, unspecified: Secondary | ICD-10-CM

## 2014-04-21 DIAGNOSIS — M549 Dorsalgia, unspecified: Secondary | ICD-10-CM

## 2014-04-21 DIAGNOSIS — I10 Essential (primary) hypertension: Secondary | ICD-10-CM

## 2014-04-21 DIAGNOSIS — F519 Sleep disorder not due to a substance or known physiological condition, unspecified: Secondary | ICD-10-CM

## 2014-04-21 LAB — CBC WITH DIFFERENTIAL/PLATELET
BASOS PCT: 0.4 % (ref 0.0–3.0)
Basophils Absolute: 0 10*3/uL (ref 0.0–0.1)
EOS ABS: 0.2 10*3/uL (ref 0.0–0.7)
Eosinophils Relative: 2.3 % (ref 0.0–5.0)
HCT: 45.5 % (ref 39.0–52.0)
Hemoglobin: 15.3 g/dL (ref 13.0–17.0)
Lymphocytes Relative: 29.6 % (ref 12.0–46.0)
Lymphs Abs: 2.9 10*3/uL (ref 0.7–4.0)
MCHC: 33.7 g/dL (ref 30.0–36.0)
MCV: 90.3 fl (ref 78.0–100.0)
Monocytes Absolute: 0.7 10*3/uL (ref 0.1–1.0)
Monocytes Relative: 6.8 % (ref 3.0–12.0)
NEUTROS PCT: 60.9 % (ref 43.0–77.0)
Neutro Abs: 5.9 10*3/uL (ref 1.4–7.7)
Platelets: 208 10*3/uL (ref 150.0–400.0)
RBC: 5.03 Mil/uL (ref 4.22–5.81)
RDW: 14.1 % (ref 11.5–14.6)
WBC: 9.7 10*3/uL (ref 4.5–10.5)

## 2014-04-21 LAB — COMPREHENSIVE METABOLIC PANEL
ALBUMIN: 3.8 g/dL (ref 3.5–5.2)
ALT: 31 U/L (ref 0–53)
AST: 23 U/L (ref 0–37)
Alkaline Phosphatase: 40 U/L (ref 39–117)
BUN: 22 mg/dL (ref 6–23)
CALCIUM: 9.3 mg/dL (ref 8.4–10.5)
CHLORIDE: 103 meq/L (ref 96–112)
CO2: 29 meq/L (ref 19–32)
Creatinine, Ser: 1.2 mg/dL (ref 0.4–1.5)
GFR: 65.67 mL/min (ref >60.00)
Glucose, Bld: 94 mg/dL (ref 70–99)
Potassium: 4.1 mEq/L (ref 3.5–5.1)
Sodium: 140 mEq/L (ref 135–145)
Total Bilirubin: 0.5 mg/dL (ref 0.3–1.2)
Total Protein: 6.5 g/dL (ref 6.0–8.3)

## 2014-04-21 LAB — TSH: TSH: 3.67 u[IU]/mL (ref 0.35–5.50)

## 2014-04-21 LAB — LIPID PANEL
Cholesterol: 151 mg/dL (ref 0–200)
HDL: 34.3 mg/dL — ABNORMAL LOW (ref >39.00)
LDL Cholesterol: 101 mg/dL — ABNORMAL HIGH (ref 0–99)
Total CHOL/HDL Ratio: 4
Triglycerides: 81 mg/dL (ref 0.0–149.0)
VLDL: 16.2 mg/dL (ref 0.0–40.0)

## 2014-04-21 MED ORDER — LORAZEPAM 1 MG PO TABS: 1.0000 mg | ORAL_TABLET | Freq: Every evening | ORAL | Status: DC | PRN

## 2014-04-21 NOTE — Assessment & Plan Note (Signed)
Labs

## 2014-04-21 NOTE — Progress Notes (Signed)
Subjective:    Patient ID: Warren [name], male    DOB: 25-May-1946, 68 y.o.   MRN: [id number]  DOS:  04/21/2014 Type of  visit:  Here for Medicare AWV: 1. Risk factors based on Past M, S, F history: recviewed   2. Physical Activities: trying to walk when he can  3. Depression/mood: (-) screening    4. Hearing: no problems reported or noted   5. ADL: independent, no driver license d/t poor vision but just saw the eye doctor     6.  Fall Risk: low risk, no recent falls   7.  Home Safety: does feel safe at home   8. Height, weight, &visual acuity: uses glasses,report saw the eye doctor within a year 9.  Counseling: provided, see below   10. Labs ordered based on risk factors: yes 11.  Referral Coordination-- if needed   12.  Care Plan-- see a/p   13. Cognitive Assessment: cognition, motor skills and memory seem normal for age   in addition, we discussed the following: insomnia, requiring more Xanax, see assessment and plan Back pain, on average uses hydrocodone 1.5 tablets daily Hypertension, good medication compliance. Atrial fibrillation, per cardiology, last note reviewed.    ROS No  CP, SOB occ  Palpitations since the diltiazem brand was changed , no syncope. Denies  nausea, vomiting diarrhea Denies  blood in the stools Had allergies, some green sputum, getting better , thinks it was allergies, no F/C Had occ wheezing   No dysuria, gross hematuria, difficulty urinating     Past Medical History  Diagnosis Date  . Hypertension   . 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 Atrial fibrillation 02-2009    w/ RVR  . Increased prostate specific antigen (PSA) velocity     Past Surgical History  Procedure Laterality Date  . Appendectomy    . Cardiac catheterization  2001    NO CAD  . Status post ablation  2002  . Tonsillectomy    . Nm  myoview ltd      12-09    History   Social History  . Marital Status: Single    Spouse Name: N/A    Number of Children: 0  . Years of Education: N/A   Occupational History  . not working    Social History Main Topics  . Smoking status: Current Every Day Smoker    Types: Cigars  . Smokeless tobacco: Not on file     Comment: HAS 2 LARGE CIGARS A DAY  . Alcohol Use: Yes     Comment: RARE  . Drug Use:   . Sexual Activity:    Other Topics Concern  . Not on file   Social History Narrative   Lives by himself , has no family, did not provide  any contact name for emergencies                   Family History  Problem Relation Age of Onset  . Adopted: Yes      Medication List       This list is accurate as of: 04/21/14  6:04 PM.  Always use your most recent med list.               aspirin 325 MG tablet  Take 325 mg by mouth daily. Take 1/2 tablet daily     CENTRUM SILVER  PO  Take 1 each by mouth daily.     co-enzyme Q-10 30 MG capsule  Take 30 mg by mouth daily.     COD LIVER OIL PO  Take 2 tablets by mouth daily.     diltiazem 120 MG 24 hr capsule  Commonly known as:  CARDIZEM CD  take 1 capsule by mouth once daily     fish oil-omega-3 fatty acids 1000 MG capsule  Take 2 g by mouth daily.     flecainide 150 MG tablet  Commonly known as:  TAMBOCOR  Take 1 tablet (150 mg total) by mouth 2 (two) times daily.     hydrochlorothiazide 25 MG tablet  Commonly known as:  HYDRODIURIL  take 1 tablet by mouth once daily     HYDROcodone-acetaminophen 10-325 MG per tablet  Commonly known as:  NORCO  Take 1 tablet by mouth 2 (two) times daily as needed.     LORazepam 1 MG tablet  Commonly known as:  ATIVAN  Take 1-2 tablets (1-2 mg total) by mouth at bedtime as needed for sleep.     metoprolol 200 MG 24 hr tablet  Commonly known as:  TOPROL-XL  take 1 tablet by mouth once daily     nitroGLYCERIN 0.4 MG SL tablet  Commonly known as:  NITROSTAT  Place  0.4 mg under the tongue every 5 (five) minutes as needed.     omeprazole 40 MG capsule  Commonly known as:  PRILOSEC  take 1 capsule by mouth once daily     potassium chloride SA 20 MEQ tablet  Commonly known as:  K-DUR,KLOR-CON  take 3 tablets by mouth once daily     TURMERIC PO  Take 4 capsules by mouth daily.           Objective:   Physical Exam BP 139/82  Pulse 55  Temp(Src) 98.3 F (36.8 C)  Ht 5' 10.1" (1.781 m)  Wt 271 lb (122.925 kg)  BMI 38.75 kg/m2  SpO2 92% General -- alert, well-developed, NAD.  Neck --no thyromegaly  HEENT-- Not pale.  Lungs -- normal respiratory effort, no intercostal retractions, no accessory muscle use, and normal breath sounds.  Heart-- normal rate, regular rhythm, no murmur.  Abdomen-- Not distended, good bowel sounds,soft, non-tender.  Extremities-- no pretibial edema bilaterally  Neurologic--  alert & oriented X3. Speech normal, gait normal, strength normal in all extremities.  Psych-- Cognition and judgment appear intact. Cooperative with normal attention span and concentration. No anxious or depressed appearing.        Assessment & Plan:

## 2014-04-21 NOTE — Assessment & Plan Note (Signed)
recheck

## 2014-04-21 NOTE — Assessment & Plan Note (Signed)
seems well-controlled, labs

## 2014-04-21 NOTE — Assessment & Plan Note (Signed)
Td 09  pneumonia shot- 2011  shingles immunization--rx was  provided last year, apparently did not used it ,benefits discussed again  Never had a Cscope , consistently declined to have one before and today, did not return a iFOB "I don't like to live forever" ; benefits of early detection discussed   Counseled about : diet, exercise, smoking cessation

## 2014-04-21 NOTE — Assessment & Plan Note (Addendum)
PSA chronically elevated, asymptomatic, in the past and again today declines a urology referral, discussed that that he could have cancer and early treatment would be better -->  still declined referral

## 2014-04-21 NOTE — Assessment & Plan Note (Signed)
On hydrocodone, on average 1-2 tablets daily

## 2014-04-21 NOTE — Progress Notes (Signed)
Pre visit review using our clinic review tool, if applicable. No additional management support is needed unless otherwise documented below in the visit note. 

## 2014-04-21 NOTE — Patient Instructions (Signed)
Get your blood work before you leave   Stop xanax,  try Ativan 1-2 tablets at bedtime, watch for excessive somnolence.   Next visit is for routine check up regards the difficulty sleeping in 6 months   No need to come back fasting Please make an appointment      Smoking Cessation Quitting smoking is important to your health and has many advantages. However, it is not always easy to quit since nicotine is a very addictive drug. Often times, people try 3 times or more before being able to quit. This document explains the best ways for you to prepare to quit smoking. Quitting takes hard work and a lot of effort, but you can do it. ADVANTAGES OF QUITTING SMOKING  You will live longer, feel better, and live better.  Your body will feel the impact of quitting smoking almost immediately.  Within 20 minutes, blood pressure decreases. Your pulse returns to its normal level.  After 8 hours, carbon monoxide levels in the blood return to normal. Your oxygen level increases.  After 24 hours, the chance of having a heart attack starts to decrease. Your breath, hair, and body stop smelling like smoke.  After 48 hours, damaged nerve endings begin to recover. Your sense of taste and smell improve.  After 72 hours, the body is virtually free of nicotine. Your bronchial tubes relax and breathing becomes easier.  After 2 to 12 weeks, lungs can hold more air. Exercise becomes easier and circulation improves.  The risk of having a heart attack, stroke, cancer, or lung disease is greatly reduced.  After 1 year, the risk of coronary heart disease is cut in half.  After 5 years, the risk of stroke falls to the same as a nonsmoker.  After 10 years, the risk of lung cancer is cut in half and the risk of other cancers decreases significantly.  After 15 years, the risk of coronary heart disease drops, usually to the level of a nonsmoker.  If you are pregnant, quitting smoking will improve your chances of  having a healthy baby.  The people you live with, especially any children, will be healthier.  You will have extra money to spend on things other than cigarettes. QUESTIONS TO THINK ABOUT BEFORE ATTEMPTING TO QUIT You may want to talk about your answers with your caregiver.  Why do you want to quit?  If you tried to quit in the past, what helped and what did not?  What will be the most difficult situations for you after you quit? How will you plan to handle them?  Who can help you through the tough times? Your family? Friends? A caregiver?  What pleasures do you get from smoking? What ways can you still get pleasure if you quit? Here are some questions to ask your caregiver:  How can you help me to be successful at quitting?  What medicine do you think would be best for me and how should I take it?  What should I do if I need more help?  What is smoking withdrawal like? How can I get information on withdrawal? GET READY  Set a quit date.  Change your environment by getting rid of all cigarettes, ashtrays, matches, and lighters in your home, car, or work. Do not let people smoke in your home.  Review your past attempts to quit. Think about what worked and what did not. GET SUPPORT AND ENCOURAGEMENT You have a better chance of being successful if you have help.  You can get support in many ways.  Tell your family, friends, and co-workers that you are going to quit and need their support. Ask them not to smoke around you.  Get individual, group, or telephone counseling and support. Programs are available at [facility] and [facility]. Call your local health department for information about programs in your area.  Spiritual beliefs and practices may help some smokers quit.  Download a "quit meter" on your computer to keep track of quit statistics, such as how long you have gone without smoking, cigarettes not smoked, and money saved.  Get a self-help book about quitting  smoking and staying off of tobacco. LEARN NEW SKILLS AND BEHAVIORS  Distract yourself from urges to smoke. Talk to someone, go for a walk, or occupy your time with a task.  Change your normal routine. Take a different route to work. Drink tea instead of coffee. Eat breakfast in a different place.  Reduce your stress. Take a hot bath, exercise, or read a book.  Plan something enjoyable to do every day. Reward yourself for not smoking.  Explore interactive web-based programs that specialize in helping you quit. GET MEDICINE AND USE IT CORRECTLY Medicines can help you stop smoking and decrease the urge to smoke. Combining medicine with the above behavioral methods and support can greatly increase your chances of successfully quitting smoking.  Nicotine replacement therapy helps deliver nicotine to your body without the negative effects and risks of smoking. Nicotine replacement therapy includes nicotine gum, lozenges, inhalers, nasal sprays, and skin patches. Some may be available over-the-counter and others require a prescription.  Antidepressant medicine helps people abstain from smoking, but how this works is unknown. This medicine is available by prescription.  Nicotinic receptor partial agonist medicine simulates the effect of nicotine in your brain. This medicine is available by prescription. Ask your caregiver for advice about which medicines to use and how to use them based on your health history. Your caregiver will tell you what side effects to look out for if you choose to be on a medicine or therapy. Carefully read the information on the package. Do not use any other product containing nicotine while using a nicotine replacement product.  RELAPSE OR DIFFICULT SITUATIONS Most relapses occur within the first 3 months after quitting. Do not be discouraged if you start smoking again. Remember, most people try several times before finally quitting. You may have symptoms of withdrawal  because your body is used to nicotine. You may crave cigarettes, be irritable, feel very hungry, cough often, get headaches, or have difficulty concentrating. The withdrawal symptoms are only temporary. They are strongest when you first quit, but they will go away within 10 14 days. To reduce the chances of relapse, try to:  Avoid drinking alcohol. Drinking lowers your chances of successfully quitting.  Reduce the amount of caffeine you consume. Once you quit smoking, the amount of caffeine in your body increases and can give you symptoms, such as a rapid heartbeat, sweating, and anxiety.  Avoid smokers because they can make you want to smoke.  Do not let weight gain distract you. Many smokers will gain weight when they quit, usually less than 10 pounds. Eat a healthy diet and stay active. You can always lose the weight gained after you quit.  Find ways to improve your mood other than smoking. FOR MORE INFORMATION  www.smokefree.gov  Document Released: 12/04/2001 Document Revised: 06/10/2012 Document Reviewed: 03/20/2012 [employer] Patient Information 2014 [place] [place].  Fall Prevention and Home Safety Falls cause injuries and can affect all age groups. It is possible to use preventive measures to significantly decrease the likelihood of falls. There are many simple measures which can make your home safer and prevent falls. OUTDOORS  Repair cracks and edges of walkways and driveways.  Remove high doorway thresholds.  Trim shrubbery on the main path into your home.  Have good outside lighting.  Clear walkways of tools, rocks, debris, and clutter.  Check that handrails are not broken and are securely fastened. Both sides of steps should have handrails.  Have leaves, snow, and ice cleared regularly.  Use sand or salt on walkways during winter months.  In the garage, clean up grease or oil spills. BATHROOM  Install night lights.  Install grab bars by the toilet and in the  tub and shower.  Use non-skid mats or decals in the tub or shower.  Place a plastic non-slip stool in the shower to sit on, if needed.  Keep floors dry and clean up all water on the floor immediately.  Remove soap buildup in the tub or shower on a regular basis.  Secure bath mats with non-slip, double-sided rug tape.  Remove throw rugs and tripping hazards from the floors. BEDROOMS  Install night lights.  Make sure a bedside light is easy to reach.  Do not use oversized bedding.  Keep a telephone by your bedside.  Have a firm chair with side arms to use for getting dressed.  Remove throw rugs and tripping hazards from the floor. KITCHEN  Keep handles on pots and pans turned toward the center of the stove. Use back burners when possible.  Clean up spills quickly and allow time for drying.  Avoid walking on wet floors.  Avoid hot utensils and knives.  Position shelves so they are not too high or low.  Place commonly used objects within easy reach.  If necessary, use a sturdy step stool with a grab bar when reaching.  Keep electrical cables out of the way.  Do not use floor polish or wax that makes floors slippery. If you must use wax, use non-skid floor wax.  Remove throw rugs and tripping hazards from the floor. STAIRWAYS  Never leave objects on stairs.  Place handrails on both sides of stairways and use them. Fix any loose handrails. Make sure handrails on both sides of the stairways are as long as the stairs.  Check carpeting to make sure it is firmly attached along stairs. Make repairs to worn or loose carpet promptly.  Avoid placing throw rugs at the top or bottom of stairways, or properly secure the rug with carpet tape to prevent slippage. Get rid of throw rugs, if possible.  Have an electrician put in a light switch at the top and bottom of the stairs. OTHER FALL PREVENTION TIPS  Wear low-heel or rubber-soled shoes that are supportive and fit well.  Wear closed toe shoes.  When using a stepladder, make sure it is fully opened and both spreaders are firmly locked. Do not climb a closed stepladder.  Add color or contrast paint or tape to grab bars and handrails in your home. Place contrasting color strips on first and last steps.  Learn and use mobility aids as needed. Install an electrical emergency response system.  Turn on lights to avoid dark areas. Replace light bulbs that burn out immediately. Get light switches that glow.  Arrange furniture to create clear pathways. Keep furniture in the same  place.  Firmly attach carpet with non-skid or double-sided tape.  Eliminate uneven floor surfaces.  Select a carpet pattern that does not visually hide the edge of steps.  Be aware of all pets. OTHER HOME SAFETY TIPS  Set the water temperature for 120 F (48.8 C).  Keep emergency numbers on or near the telephone.  Keep smoke detectors on every level of the home and near sleeping areas. Document Released: 11/30/2002 Document Revised: 06/10/2012 Document Reviewed: 02/29/2012 [facility] Patient Information 2014 [place], [place].

## 2014-04-21 NOTE — Telephone Encounter (Signed)
Relevant patient education mailed to patient.  

## 2014-04-21 NOTE — Assessment & Plan Note (Signed)
UDS 8-14 neg, repeat today Used to sleep well with one or 2 alprazolam, now requiring 3 or 4. Denies increased stress, does not take excessive caffeine, does not use a computer at night. Plan: Switch to Ativan. See instructions

## 2014-04-23 ENCOUNTER — Other Ambulatory Visit: Payer: Self-pay | Admitting: Internal Medicine

## 2014-04-26 ENCOUNTER — Telehealth: Payer: Self-pay | Admitting: Internal Medicine

## 2014-04-26 ENCOUNTER — Encounter: Payer: Self-pay | Admitting: *Deleted

## 2014-04-26 NOTE — Telephone Encounter (Signed)
Caller name:Warren [name] Relation to WG:NFAOZHYpt:patient Call back number: [phone] Pharmacy:[facility] - [place], [place] - [address]   Reason for call: patient states that [facility] pharmacy stated that we denied his request for hydrochlorothiazide (HYDRODIURIL) 25 MG tablet. Please advise

## 2014-04-28 NOTE — Telephone Encounter (Signed)
Spoke with pharmacist pt picked up rx on 04/26/14 .

## 2014-04-29 ENCOUNTER — Telehealth: Payer: Self-pay

## 2014-04-29 NOTE — Telephone Encounter (Addendum)
UDS:  04/23/2014 Positive for hydrocodone and alprazolam  Negative for lorazepam--new Rx Per Dr Drue NovelPaz, low risk

## 2014-05-18 ENCOUNTER — Encounter: Payer: Self-pay | Admitting: Internal Medicine

## 2014-05-20 ENCOUNTER — Other Ambulatory Visit: Payer: Self-pay | Admitting: Internal Medicine

## 2014-06-18 ENCOUNTER — Other Ambulatory Visit: Payer: Self-pay | Admitting: Internal Medicine

## 2014-07-07 ENCOUNTER — Telehealth: Payer: Self-pay | Admitting: Internal Medicine

## 2014-07-07 MED ORDER — HYDROCODONE-ACETAMINOPHEN 10-325 MG PO TABS: 1.0000 | ORAL_TABLET | Freq: Two times a day (BID) | ORAL | Status: DC | PRN

## 2014-07-07 NOTE — Telephone Encounter (Signed)
done

## 2014-07-07 NOTE — Telephone Encounter (Signed)
Caller name: Warren [name] Relation to pt: patient Call back number: [phone] Pharmacy:  Reason for call: patient called to request a refill for hydrcodone

## 2014-07-07 NOTE — Telephone Encounter (Signed)
rx refill - hydrocodone 10-325mg  Last OV- 04/21/14 Last refilled- 03/09/14 #180 / 0 rf  UDS- 04/23/14 LOW rsik

## 2014-07-07 NOTE — Telephone Encounter (Signed)
Pt notified of pick up.  

## 2014-07-20 ENCOUNTER — Other Ambulatory Visit: Payer: Self-pay | Admitting: Internal Medicine

## 2014-07-22 ENCOUNTER — Other Ambulatory Visit: Payer: Self-pay

## 2014-07-22 MED ORDER — FLECAINIDE ACETATE 150 MG PO TABS: 150.0000 mg | ORAL_TABLET | Freq: Two times a day (BID) | ORAL | Status: DC

## 2014-08-16 ENCOUNTER — Other Ambulatory Visit: Payer: Self-pay | Admitting: Internal Medicine

## 2014-08-16 NOTE — Telephone Encounter (Signed)
Pt is requesting Ativan refill.  Last OV: 04/21/2014  Last Fill: 04/21/2014 # 60 with 1 RF  Last UDS: 04/29/2014: Low risk  Please Advise.    

## 2014-08-18 NOTE — Telephone Encounter (Signed)
Caller name: Wai  Relation to pt: self  Call back number: [phone] Pharmacy: [facility] [phone]   Reason for call: pt requesting LORazepam (ATIVAN) 1 MG tablet

## 2014-08-18 NOTE — Telephone Encounter (Signed)
Pt is requesting Ativan refill.  Last OV: 04/21/2014  Last Fill: 04/21/2014 # 60 with 1 RF  Last UDS: 04/29/2014: Low risk  Please Advise.

## 2014-08-22 NOTE — Telephone Encounter (Signed)
Will RF 

## 2014-08-23 NOTE — Telephone Encounter (Signed)
RF printed, next visit due 10-15

## 2014-08-23 NOTE — Telephone Encounter (Signed)
Medication faxed to pharmacy 

## 2014-08-24 ENCOUNTER — Telehealth: Payer: Self-pay | Admitting: Internal Medicine

## 2014-08-24 MED ORDER — ALPRAZOLAM 0.5 MG PO TABS: ORAL_TABLET | ORAL | Status: DC

## 2014-08-24 MED ORDER — ZOLPIDEM TARTRATE 10 MG PO TABS: 10.0000 mg | ORAL_TABLET | Freq: Every evening | ORAL | Status: DC | PRN

## 2014-08-24 NOTE — Telephone Encounter (Signed)
Hold xanax Try ambien 10 mg 1 po qhs #30, no RF Also schedule a visit to discuss

## 2014-08-24 NOTE — Telephone Encounter (Signed)
LMOM for Pt to return call.  

## 2014-08-24 NOTE — Telephone Encounter (Signed)
Spoke with Pt, he has trouble sleeping because he wakes up 6 to 7 times during the night because he has to go to the bathroom. Pt wants to know if he could possibly get something for this and if he can possibly get a higher dosage of the Xanax since he stated he has already taken Xanax at the same dosage before and was having to take 4 to 5 pills at night to even be able to sleep.  Please advise.

## 2014-08-24 NOTE — Telephone Encounter (Signed)
Caller name: Warren [name]  Call back number:[phone]   Reason for call:   Pt states he called the after hours number on 8/28 and told them that he was having reactions to the medication. He requested something different than the LORazepam (ATIVAN) 1 MG tablet.  I can find no notes in the system that reflect what the patient is saying.  Pt states that he ended up receiving the Ativan in the mail.  He liked taking the xanax.  Pt states he does not drive and would just like to speak with the CMA or RN about what he can do.  Could you contact to speak with patient to advise.

## 2014-08-24 NOTE — Telephone Encounter (Signed)
Pt was taken off xanax and switched to Ativan back in April (see Office Visit 04/21/14).  He started taking Ativan 1 tablet back in April.  Medication was not effective for sleep, so he increased it to 2 tablets 2 weeks ago.  After increasing the medication, pt noted that while he was sleeping some at night, he also noted increased drowsiness ("zombie zone") in the afternoons.  He also started to experience moving zigzag lines in his eyes and splitting headaches.  Due to symptoms, pt decreased his dose back to 1 tablet, but continues to experience sluggishness in the afternoon and zigzag lines in eyes, but to a much milder degree.  Pt is calling to see what he should do next.  He shared that he has been on xanax for 5 years and never had any side effects. The only issue he had with xanax was sleeping during the wrong hours.  States he would be up all night, and sleep between 10 am and 4 and 5 pm.  When asked if he would like to switch back to xanax, he stated he just wanted to be put on something that works whether it be increasing the xanax or something over the counter or something.     Please advise.

## 2014-08-24 NOTE — Telephone Encounter (Signed)
Spoke with Pt and informed him that medication was faxed to pharmacy, instructed him to let us know how he is doing in a few days.

## 2014-08-24 NOTE — Telephone Encounter (Signed)
For now, will go back on Xanax 2 tablets before going to bed, rx printed; if not effective consider increase dose or use ambien. Needs to go to bed the same time every night Discontinue Ativan. I'm not sure about the"zigzag lines" or headaches, if he is not completely back to normal within few days he needs to be seen. Recommend to keep his appointment 10/25/2014

## 2014-09-14 ENCOUNTER — Other Ambulatory Visit: Payer: Self-pay | Admitting: Family Medicine

## 2014-09-14 ENCOUNTER — Other Ambulatory Visit: Payer: Self-pay | Admitting: Internal Medicine

## 2014-09-14 ENCOUNTER — Other Ambulatory Visit: Payer: Self-pay

## 2014-09-14 MED ORDER — FLECAINIDE ACETATE 150 MG PO TABS: 150.0000 mg | ORAL_TABLET | Freq: Two times a day (BID) | ORAL | Status: DC

## 2014-09-29 ENCOUNTER — Telehealth: Payer: Self-pay

## 2014-09-29 ENCOUNTER — Other Ambulatory Visit: Payer: Self-pay | Admitting: Internal Medicine

## 2014-09-29 MED ORDER — ZOLPIDEM TARTRATE 10 MG PO TABS: 10.0000 mg | ORAL_TABLET | Freq: Every evening | ORAL | Status: DC | PRN

## 2014-09-29 NOTE — Telephone Encounter (Signed)
Too early to refill [place]

## 2014-09-29 NOTE — Addendum Note (Signed)
Addended by: Dorette GrateFAULKNER, Melessa Cowell C on: 09/29/2014 04:45 PM   Modules accepted: Orders

## 2014-09-29 NOTE — Telephone Encounter (Signed)
Spoke with Dr. Drue NovelPaz, RF authorized.

## 2014-09-29 NOTE — Telephone Encounter (Signed)
Sent to [facility]

## 2014-09-29 NOTE — Telephone Encounter (Signed)
Pt is requesting refill on Zolpidem  Last OV: 04/21/2014, Appt scheduled 10/25/2014 at 2 Last Fill: 08/24/2014 # 30 0 RF UDS: 04/23/2014 Low Risk   Please advise.

## 2014-10-15 ENCOUNTER — Other Ambulatory Visit: Payer: Self-pay | Admitting: *Deleted

## 2014-10-15 MED ORDER — FLECAINIDE ACETATE 150 MG PO TABS: 150.0000 mg | ORAL_TABLET | Freq: Two times a day (BID) | ORAL | Status: DC

## 2014-10-20 ENCOUNTER — Telehealth: Payer: Self-pay | Admitting: Internal Medicine

## 2014-10-20 MED ORDER — HYDROCODONE-ACETAMINOPHEN 10-325 MG PO TABS
1.0000 | ORAL_TABLET | Freq: Two times a day (BID) | ORAL | Status: DC | PRN
Start: 2014-10-20 — End: 2015-01-20

## 2014-10-20 NOTE — Telephone Encounter (Signed)
Caller name: Jermarcus  Call back number:[phone]   Reason for call:  Pt is wanting the refill on Rx HYDROcodone-acetaminophen (NORCO) 10-325 MG per tablet .  Pt has apt on Monday @ 2pm Will pick up then.

## 2014-10-20 NOTE — Telephone Encounter (Signed)
Done

## 2014-10-20 NOTE — Telephone Encounter (Signed)
Pt is requesting refill on Hydrocodone.   Last OV: 04/21/2014, appt scheduled 11/2 @ 2 Last Fill: 07/07/2014 # 180 0RF UDS: 04/23/2014 Low risk   Please advise.

## 2014-10-21 NOTE — Telephone Encounter (Signed)
Will hold onto rx until appt next week.

## 2014-10-22 ENCOUNTER — Ambulatory Visit: Payer: Medicare Other | Admitting: Internal Medicine

## 2014-10-25 ENCOUNTER — Ambulatory Visit (INDEPENDENT_AMBULATORY_CARE_PROVIDER_SITE_OTHER): Payer: Medicare Other | Admitting: Internal Medicine

## 2014-10-25 ENCOUNTER — Ambulatory Visit (INDEPENDENT_AMBULATORY_CARE_PROVIDER_SITE_OTHER): Payer: Medicare Other

## 2014-10-25 ENCOUNTER — Encounter: Payer: Self-pay | Admitting: Internal Medicine

## 2014-10-25 VITALS — BP 132/82 | HR 55 | Temp 97.8°F | Wt 276.0 lb

## 2014-10-25 DIAGNOSIS — I10 Essential (primary) hypertension: Secondary | ICD-10-CM

## 2014-10-25 DIAGNOSIS — Z23 Encounter for immunization: Secondary | ICD-10-CM

## 2014-10-25 DIAGNOSIS — G4700 Insomnia, unspecified: Secondary | ICD-10-CM

## 2014-10-25 MED ORDER — TRAZODONE HCL 50 MG PO TABS: 25.0000 mg | ORAL_TABLET | Freq: Every evening | ORAL | Status: DC | PRN

## 2014-10-25 NOTE — Progress Notes (Signed)
Subjective:    Patient ID: Warren [name], male    DOB: 1946-07-13, 68 y.o.   MRN: [id number]  DOS:  10/25/2014 Type of visit - description : rov Interval history: Insomnia, on Ambien,   helps him sleep for 6 or 7 hour but then when he wakes he feels drowsy for 4 additional hours. On hydrocodone as needed, symptoms well-controlled Hypertension, good medication compliance, ambulatory BPs ~ 130/60   ROS Denies fever chills No chest pain, difficulty breathing or palpitations Had a bronchial infection few weeks ago, now asymptomatic. Request a flu shot  Past Medical History  Diagnosis Date  . Hypertension   . 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 Atrial fibrillation 02-2009    w/ RVR  . Increased prostate specific antigen (PSA) velocity     Past Surgical History  Procedure Laterality Date  . Appendectomy    . Cardiac catheterization  2001    NO CAD  . Status post ablation  2002  . Tonsillectomy    . Nm myoview ltd      12-09    History   Social History  . Marital Status: Single    Spouse Name: N/A    Number of Children: 0  . Years of Education: N/A   Occupational History  . not working    Social History Main Topics  . Smoking status: Current Every Day Smoker    Types: Cigars  . Smokeless tobacco: Not on file     Comment: HAS 2 LARGE CIGARS A DAY  . Alcohol Use: Yes     Comment: RARE  . Drug Use: Not on file  . Sexual Activity: Not on file   Other Topics Concern  . Not on file   Social History Narrative   Lives by himself , has no family, did not provide  any contact name for emergencies                      Medication List       This list is accurate as of: 10/25/14 11:59 PM.  Always use your most recent med list.               aspirin 325 MG tablet  Take 325 mg by mouth daily. Take 1/2 tablet daily     CENTRUM SILVER PO  Take 1 each by mouth daily.     co-enzyme Q-10 30 MG capsule  Take 30 mg by mouth daily.     COD LIVER OIL PO  Take 2 tablets by mouth daily.     diltiazem 120 MG 24 hr capsule  Commonly known as:  CARDIZEM CD  take 1 capsule by mouth once daily     fish oil-omega-3 fatty acids 1000 MG capsule  Take 2 g by mouth daily.     flecainide 150 MG tablet  Commonly known as:  TAMBOCOR  Take 1 tablet (150 mg total) by mouth 2 (two) times daily.     hydrochlorothiazide 25 MG tablet  Commonly known as:  HYDRODIURIL  take 1 tablet by mouth once daily     HYDROcodone-acetaminophen 10-325 MG per tablet  Commonly known as:  NORCO  Take 1 tablet by mouth 2 (two) times daily as needed.     metoprolol 200 MG 24 hr tablet  Commonly known as:  TOPROL-XL  take 1 tablet by mouth  once daily     nitroGLYCERIN 0.4 MG SL tablet  Commonly known as:  NITROSTAT  Place 0.4 mg under the tongue every 5 (five) minutes as needed.     omeprazole 40 MG capsule  Commonly known as:  PRILOSEC  take 1 capsule by mouth once daily     potassium chloride SA 20 MEQ tablet  Commonly known as:  K-DUR,KLOR-CON  take 3 tablets by mouth once daily     traZODone 50 MG tablet  Commonly known as:  DESYREL  Take 0.5-1 tablets (25-50 mg total) by mouth at bedtime as needed for sleep.     TURMERIC PO  Take 4 capsules by mouth daily.     zolpidem 10 MG tablet  Commonly known as:  AMBIEN  Take 1 tablet (10 mg total) by mouth at bedtime as needed for sleep.           Objective:   Physical Exam BP 132/82 mmHg  Pulse 55  Temp(Src) 97.8 F (36.6 C) (Oral)  Wt 276 lb (125.193 kg)  SpO2 98% General -- alert, well-developed, NAD.  Lungs -- normal respiratory effort, no intercostal retractions, no accessory muscle use, and normal breath sounds.  Heart-- brady, no murmur.   Extremities-- no pretibial edema bilaterally  Neurologic--  alert & oriented X3. Speech normal, gait appropriate for  age, strength symmetric and appropriate for age.  Psych-- Cognition and judgment appear intact. Cooperative with normal attention span and concentration. No anxious or depressed appearing.      Assessment & Plan:

## 2014-10-25 NOTE — Patient Instructions (Signed)
Get your blood work before you leave   Hold Ambien  Try trazodone to help you sleep, if that is not helping, let me know.  HEALTHY SLEEP Sleep hygiene: Basic rules for a good night's sleep  Sleep only as much as you need to feel rested and then get out of bed  Keep a regular sleep schedule  Avoid forcing sleep  Exercise regularly for at least 20 minutes, preferably 4 to 5 hours before bedtime  Avoid caffeinated beverages after lunch  Avoid alcohol near bedtime: no "night cap"  Avoid smoking, especially in the evening  Do not go to bed hungry  Adjust bedroom environment  Avoid prolonged use of light-emitting screens before bedtime   Deal with your worries before bedtime       Please come back to the office by 03-2015  for a physical exam. Come back fasting

## 2014-10-25 NOTE — Progress Notes (Signed)
Pre visit review using our clinic review tool, if applicable. No additional management support is needed unless otherwise documented below in the visit note. 

## 2014-10-25 NOTE — Assessment & Plan Note (Signed)
Continue with Toprol, hydrochlorothiazide and Cardizem. Check a BMP and magnesium

## 2014-10-25 NOTE — Assessment & Plan Note (Signed)
Since the last visit, he tried Ativan and that didn't work. Currently taking Ambien, sleeps for 6 or 7 hours but then when he wakes stays drowsy for 4 additional hours. Plan: Hold Ambien, trial with trazodone. If not better, asked patient to call, could increase dose or refer to see Dr Richardean Chimeraohmeir

## 2014-10-26 ENCOUNTER — Ambulatory Visit (INDEPENDENT_AMBULATORY_CARE_PROVIDER_SITE_OTHER): Payer: Medicare Other | Admitting: Internal Medicine

## 2014-10-26 ENCOUNTER — Telehealth: Payer: Self-pay | Admitting: Internal Medicine

## 2014-10-26 ENCOUNTER — Encounter: Payer: Self-pay | Admitting: Internal Medicine

## 2014-10-26 VITALS — BP 140/89 | HR 46 | Ht 70.0 in | Wt 275.0 lb

## 2014-10-26 DIAGNOSIS — G4730 Sleep apnea, unspecified: Secondary | ICD-10-CM

## 2014-10-26 DIAGNOSIS — I10 Essential (primary) hypertension: Secondary | ICD-10-CM

## 2014-10-26 DIAGNOSIS — R001 Bradycardia, unspecified: Secondary | ICD-10-CM

## 2014-10-26 DIAGNOSIS — I480 Paroxysmal atrial fibrillation: Secondary | ICD-10-CM

## 2014-10-26 DIAGNOSIS — G478 Other sleep disorders: Secondary | ICD-10-CM

## 2014-10-26 LAB — BASIC METABOLIC PANEL
BUN: 20 mg/dL (ref 6–23)
CALCIUM: 9.2 mg/dL (ref 8.4–10.5)
CO2: 27 meq/L (ref 19–32)
Chloride: 104 mEq/L (ref 96–112)
Creatinine, Ser: 1.5 mg/dL (ref 0.4–1.5)
GFR: 49.99 mL/min — ABNORMAL LOW (ref >60.00)
Glucose, Bld: 81 mg/dL (ref 70–99)
Potassium: 4.3 mEq/L (ref 3.5–5.1)
SODIUM: 144 meq/L (ref 135–145)

## 2014-10-26 LAB — MAGNESIUM: MAGNESIUM: 2.2 mg/dL (ref 1.5–2.5)

## 2014-10-26 NOTE — Telephone Encounter (Signed)
emmi mailed  °

## 2014-10-26 NOTE — Patient Instructions (Signed)
Your physician recommends that you continue on your current medications as directed. Please refer to the Current Medication list given to you today.  Your physician wants you to follow-up in: 12 months with Dr. Klein You will receive a reminder letter in the mail two months in advance. If you don't receive a letter, please call our office to schedule the follow-up appointment.  Thank you for visiting with [facility] today!!    

## 2014-10-26 NOTE — Progress Notes (Signed)
Patient Care Team: Wanda PlumpJose E Paz, MD as PCP - General   HPI  Warren [name] is a 68 y.o. male Seen in followup for atrial fibrillation in the context of hypertension. He takes flecainide Cardizem and aspirin.  His his CHADS-  2 score is one and his CHADS-VASc score is 2.  He has no complaints of exercise intolerance edema or fatigue.  Sleep issues are being addressed.     Past Medical History  Diagnosis Date  . Hypertension   . 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 Atrial fibrillation 02-2009    w/ RVR  . Increased prostate specific antigen (PSA) velocity     Past Surgical History  Procedure Laterality Date  . Appendectomy    . Cardiac catheterization  2001    NO CAD  . Status post ablation  2002  . Tonsillectomy    . Nm myoview ltd      12-09    Current Outpatient Prescriptions  Medication Sig Dispense Refill  . aspirin 81 MG tablet Take 81 mg by mouth 2 (two) times daily.    Marland Kitchen. co-enzyme Q-10 30 MG capsule Take 30 mg by mouth daily.     . COD LIVER OIL PO Take 2 tablets by mouth daily.     Marland Kitchen. diltiazem (CARDIZEM CD) 120 MG 24 hr capsule take 1 capsule by mouth once daily 30 capsule 5  . fish oil-omega-3 fatty acids 1000 MG capsule Take 2 g by mouth daily.      . flecainide (TAMBOCOR) 150 MG tablet Take 1 tablet (150 mg total) by mouth 2 (two) times daily. 60 tablet 0  . hydrochlorothiazide (HYDRODIURIL) 25 MG tablet take 1 tablet by mouth once daily 30 tablet 6  . HYDROcodone-acetaminophen (NORCO) 10-325 MG per tablet Take 1 tablet by mouth 2 (two) times daily as needed. 180 tablet 0  . metoprolol (TOPROL-XL) 200 MG 24 hr tablet take 1 tablet by mouth once daily 30 tablet 3  . Multiple Vitamins-Minerals (CENTRUM SILVER PO) Take 1 each by mouth daily.      . nitroGLYCERIN (NITROSTAT) 0.4 MG SL tablet Place 0.4 mg under the tongue every  5 (five) minutes as needed.      Marland Kitchen. omeprazole (PRILOSEC) 40 MG capsule take 1 capsule by mouth once daily 30 capsule 6  . potassium chloride SA (K-DUR,KLOR-CON) 20 MEQ tablet take 3 tablets by mouth once daily 90 tablet 1  . traZODone (DESYREL) 50 MG tablet Take 0.5-1 tablets (25-50 mg total) by mouth at bedtime as needed for sleep. 30 tablet 3  . TURMERIC PO Take 4 capsules by mouth daily.       No current facility-administered medications for this visit.    No Known Allergies  Review of Systems negative except from HPI and PMH  Physical Exam BP 140/89 mmHg  Pulse 46  Ht 5\' 10"  (1.778 m)  Wt 275 lb (124.739 kg)  BMI 39.46 kg/m2 Well developed and well nourished in no acute distress HENT normal E scleral and icterus clear Neck Supple JVP flat; carotids brisk and full Clear to ausculation  Regular rate and rhythm, no murmurs gallops or rub Soft with active bowel sounds No clubbing cyanosis nonea Edema Alert and oriented, grossly normal motor and sensory function Skin Warm and Dry  ECG demonstrates sinus rhythm at 42 Intervals 22/10/53 with a QTC of 44 Axis LIX  Assessment and  Plan  Paroxysmal atrial fibrillation  Hypertension  Sleep-disordered breathing  Morbidly obese  Sinus bradycardia  We will continue him on his current medications. He was reluctant for us to down titrate his beta blocker/calcium blockers. Event that exercise intolerance becomes a progressive issue, I would begin there.  I encouraged him to consider sleep study. He elected have to defer  this

## 2014-11-09 ENCOUNTER — Other Ambulatory Visit: Payer: Self-pay | Admitting: Internal Medicine

## 2014-11-12 ENCOUNTER — Other Ambulatory Visit: Payer: Self-pay | Admitting: *Deleted

## 2014-11-12 MED ORDER — FLECAINIDE ACETATE 150 MG PO TABS: 150.0000 mg | ORAL_TABLET | Freq: Two times a day (BID) | ORAL | Status: DC

## 2014-12-08 ENCOUNTER — Other Ambulatory Visit: Payer: Self-pay | Admitting: Internal Medicine

## 2015-01-13 ENCOUNTER — Other Ambulatory Visit: Payer: Self-pay | Admitting: Internal Medicine

## 2015-01-20 ENCOUNTER — Telehealth: Payer: Self-pay | Admitting: Internal Medicine

## 2015-01-20 MED ORDER — HYDROCODONE-ACETAMINOPHEN 10-325 MG PO TABS: 1.0000 | ORAL_TABLET | Freq: Two times a day (BID) | ORAL | Status: DC | PRN

## 2015-01-20 NOTE — Telephone Encounter (Signed)
Caller name: Anibal Relation to pt: self Call back number: [phone] Pharmacy:  Reason for call:   Patient requesting hydrocodone refill

## 2015-01-20 NOTE — Telephone Encounter (Signed)
done

## 2015-01-20 NOTE — Telephone Encounter (Signed)
Pt is requesting refill for Hydrocodone.  Last OV:10/25/2014 Last Fill: 10/20/2014 # 180 0RF UDS: 04/23/2014 Low risk  Please advise.

## 2015-01-21 NOTE — Telephone Encounter (Signed)
LMOM informing Pt, Rx is ready for pick up at front desk.

## 2015-03-06 ENCOUNTER — Other Ambulatory Visit: Payer: Self-pay | Admitting: Internal Medicine

## 2015-03-07 NOTE — Telephone Encounter (Signed)
Pt is requesting refill on Trazodone.  Last OV: 10/25/2014 Last Fill: 10/25/2014 # 30 3RF UDS: 04/23/2014 Low risk  Please advise.

## 2015-03-07 NOTE — Telephone Encounter (Signed)
Ok 30 and 4 RF 

## 2015-03-07 NOTE — Telephone Encounter (Signed)
Rx refilled to [facility].

## 2015-04-07 ENCOUNTER — Telehealth: Payer: Self-pay | Admitting: Internal Medicine

## 2015-04-07 NOTE — Telephone Encounter (Signed)
Pre visit letter sent  °

## 2015-04-21 ENCOUNTER — Telehealth: Payer: Self-pay | Admitting: Internal Medicine

## 2015-04-21 NOTE — Telephone Encounter (Signed)
Caller name: Radwan Relation to pt: self Call back number: [phone] Pharmacy:  Reason for call:   Requesting new hydrocodone rx. 90 day supply. Will pick up on Monday at time of his appointment

## 2015-04-21 NOTE — Telephone Encounter (Signed)
Pt is requesting refill on Hydrocodone.  Last OV: 10/26/2014, appt scheduled for 04/25/2015 at 1000 Last Fill: 01/20/2015 #180 0RF UDS: 04/23/2014 Low risk  DUE FOR UDS  Please advise.

## 2015-04-22 MED ORDER — HYDROCODONE-ACETAMINOPHEN 10-325 MG PO TABS: 1.0000 | ORAL_TABLET | Freq: Two times a day (BID) | ORAL | Status: DC | PRN

## 2015-04-22 NOTE — Telephone Encounter (Signed)
Rx printed, awaiting MD signature.  

## 2015-04-22 NOTE — Telephone Encounter (Signed)
Ok 180, UDS @ time of next visit

## 2015-04-22 NOTE — Telephone Encounter (Signed)
Rx placed at front desk for pick up at Pt's earliest convenience.

## 2015-04-25 ENCOUNTER — Encounter: Payer: Self-pay | Admitting: Internal Medicine

## 2015-04-25 ENCOUNTER — Ambulatory Visit (INDEPENDENT_AMBULATORY_CARE_PROVIDER_SITE_OTHER): Payer: Medicare Other | Admitting: Internal Medicine

## 2015-04-25 VITALS — BP 118/64 | HR 43 | Temp 98.1°F | Ht 70.0 in | Wt 272.2 lb

## 2015-04-25 DIAGNOSIS — E785 Hyperlipidemia, unspecified: Secondary | ICD-10-CM

## 2015-04-25 DIAGNOSIS — E039 Hypothyroidism, unspecified: Secondary | ICD-10-CM

## 2015-04-25 DIAGNOSIS — G4700 Insomnia, unspecified: Secondary | ICD-10-CM | POA: Diagnosis not present

## 2015-04-25 DIAGNOSIS — I10 Essential (primary) hypertension: Secondary | ICD-10-CM | POA: Diagnosis not present

## 2015-04-25 DIAGNOSIS — E079 Disorder of thyroid, unspecified: Secondary | ICD-10-CM

## 2015-04-25 DIAGNOSIS — F17200 Nicotine dependence, unspecified, uncomplicated: Secondary | ICD-10-CM

## 2015-04-25 DIAGNOSIS — Z0000 Encounter for general adult medical examination without abnormal findings: Secondary | ICD-10-CM | POA: Diagnosis not present

## 2015-04-25 DIAGNOSIS — R972 Elevated prostate specific antigen [PSA]: Secondary | ICD-10-CM | POA: Diagnosis not present

## 2015-04-25 DIAGNOSIS — I480 Paroxysmal atrial fibrillation: Secondary | ICD-10-CM

## 2015-04-25 LAB — BASIC METABOLIC PANEL
BUN: 21 mg/dL (ref 6–23)
CHLORIDE: 102 meq/L (ref 96–112)
CO2: 32 mEq/L (ref 19–32)
CREATININE: 1.42 mg/dL (ref 0.40–1.50)
Calcium: 9.1 mg/dL (ref 8.4–10.5)
GFR: 52.36 mL/min — AB (ref >60.00)
GLUCOSE: 93 mg/dL (ref 70–99)
Potassium: 4.1 mEq/L (ref 3.5–5.1)
SODIUM: 140 meq/L (ref 135–145)

## 2015-04-25 LAB — LIPID PANEL
CHOLESTEROL: 165 mg/dL (ref 0–200)
HDL: 34.5 mg/dL — AB (ref >39.00)
LDL Cholesterol: 108 mg/dL — ABNORMAL HIGH (ref 0–99)
NONHDL: 130.5
TRIGLYCERIDES: 114 mg/dL (ref 0.0–149.0)
Total CHOL/HDL Ratio: 5
VLDL: 22.8 mg/dL (ref 0.0–40.0)

## 2015-04-25 LAB — TSH: TSH: 6.28 u[IU]/mL — ABNORMAL HIGH (ref 0.35–4.50)

## 2015-04-25 LAB — PSA: PSA: 4.54 ng/mL — ABNORMAL HIGH (ref 0.10–4.00)

## 2015-04-25 MED ORDER — HYDROCHLOROTHIAZIDE 25 MG PO TABS: 25.0000 mg | ORAL_TABLET | Freq: Every day | ORAL | Status: DC

## 2015-04-25 MED ORDER — METOPROLOL SUCCINATE ER 200 MG PO TB24: 200.0000 mg | ORAL_TABLET | Freq: Every day | ORAL | Status: DC

## 2015-04-25 MED ORDER — OMEPRAZOLE 40 MG PO CPDR: 40.0000 mg | DELAYED_RELEASE_CAPSULE | Freq: Every day | ORAL | Status: DC

## 2015-04-25 MED ORDER — POTASSIUM CHLORIDE CRYS ER 20 MEQ PO TBCR: 60.0000 meq | EXTENDED_RELEASE_TABLET | Freq: Every day | ORAL | Status: DC

## 2015-04-25 MED ORDER — DILTIAZEM HCL ER COATED BEADS 120 MG PO CP24: 120.0000 mg | ORAL_CAPSULE | Freq: Every day | ORAL | Status: DC

## 2015-04-25 NOTE — Assessment & Plan Note (Signed)
Controlled with on average two vicodin qd, UDS due

## 2015-04-25 NOTE — Assessment & Plan Note (Signed)
Still declined to see urology but agreed to check a PSA and have a DRE.

## 2015-04-25 NOTE — Assessment & Plan Note (Addendum)
Td 09  pneumonia shot- 2011  prevnar-- 2015 shingles immunization--rx was  provided , has not used it   Never had a Cscope , again declined colon cancer screening.   Counseled about : diet, exercise, smoking cessation

## 2015-04-25 NOTE — Assessment & Plan Note (Signed)
Diet controlled, labs

## 2015-04-25 NOTE — Progress Notes (Signed)
Subjective:    Patient ID: Warren [name], male    DOB: 1946/02/19, 69 y.o.   MRN: [id number]  DOS:  04/25/2015 Type of visit - description :  Here for Medicare AWV: 1.Risk factors based on Past M, S, F history: recviewed  2.Physical Activities: occ walks  3.Depression/mood: (-) screening  4.Hearing: no problems reported or noted  5.ADL: independent, no driver license d/t poor vision , does see the eye doctor regulalrly  6.Fall Risk: low risk, no recent falls  7.Home Safety: does feel safe at home  8.Height, weight, &visual acuity: uses glasses  9.Counseling: provided, see below  10.Labs ordered based on risk factors: yes 11.Referral Coordination-- if needed  12.Care Plan-- see a/p  13.Cognitive Assessment: cognition, motor skills and memory seem normal for age  69. Care team updated , sees Dr Graciela HusbandsKlein 15. End of life care discussed , he has no family or relatives in the [place], not in contact w/ any family in [place]; has some local friends ; encourage to have his paperwork in order, probably a good idea will be to hire a lawyer to be his POA.  in addition, we discussed the following:  Cardiovascular, good compliance of medication, no recent symptoms Back pain, on hydrocodone, on average takes 2 hydrocodone daily Insomnia, on trazodone taking one tablet daily and melatonin 5 mg daily, symptoms could be better controlled.     Review of Systems Constitutional: No fever, chills. No unexplained wt changes. No unusual sweats HEENT: No dental problems, ear discharge, facial swelling, voice changes. No eye discharge, redness or intolerance to light Respiratory: No wheezing or difficulty breathing. No cough , mucus production Cardiovascular: No CP, leg swelling or palpitations GI: no nausea, vomiting, diarrhea or abdominal pain.  No blood in the stools. No dysphagia   Endocrine: No polyphagia, polyuria or polydipsia GU: No dysuria, gross hematuria, difficulty  urinating. No urinary urgency or frequency. Musculoskeletal: No joint swellings or unusual aches or pains; back pain at baseline, controlled Skin: No change in the color of the skin, palor or rash Allergic, immunologic: + environmental allergies . No food allergies Neurological: No dizziness or syncope. No headaches. No diplopia, slurred speech, motor deficits, facial numbness Hematological: No enlarged lymph nodes, easy bruising or bleeding Psychiatry: No suicidal ideas, hallucinations, behavior problems or confusion. No unusual/severe anxiety or depression.     Past Medical History  Diagnosis Date  . Hypertension   . 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 Atrial fibrillation 02-2009    w/ RVR  . Increased prostate specific antigen (PSA) velocity     Past Surgical History  Procedure Laterality Date  . Appendectomy    . Cardiac catheterization  2001    NO CAD  . Status post ablation  2002  . Tonsillectomy    . Nm myoview ltd      12-09    History   Social History  . Marital Status: Single    Spouse Name: N/A  . Number of Children: 0  . Years of Education: N/A   Occupational History  . not working, [employer], [place] to [employer] [place]    Social History Main Topics  . Smoking status: Current Every Day Smoker    Types: Cigars  . Smokeless tobacco: Not on file     Comment: HAS 2 LARGE CIGARS A DAY  . Alcohol Use: Yes  Comment: RARE  . Drug Use: Not on file  . Sexual Activity: Not on file   Other Topics Concern  . Not on file   Social History Narrative   Lives by himself , has no family, did not provide  any contact name for emergencies                   Family History  Problem Relation Age of Onset  . Adopted: Yes       Medication List       This list is accurate as of: 04/25/15  8:31 PM.  Always use your most  recent med list.               aspirin 81 MG tablet  Take 81 mg by mouth 2 (two) times daily.     CENTRUM SILVER PO  Take 1 each by mouth daily.     co-enzyme Q-10 30 MG capsule  Take 30 mg by mouth daily.     COD LIVER OIL PO  Take 2 tablets by mouth daily.     diltiazem 120 MG 24 hr capsule  Commonly known as:  CARDIZEM CD  Take 1 capsule (120 mg total) by mouth daily.     fish oil-omega-3 fatty acids 1000 MG capsule  Take 2 g by mouth daily.     flecainide 150 MG tablet  Commonly known as:  TAMBOCOR  Take 1 tablet (150 mg total) by mouth 2 (two) times daily.     hydrochlorothiazide 25 MG tablet  Commonly known as:  HYDRODIURIL  Take 1 tablet (25 mg total) by mouth daily.     HYDROcodone-acetaminophen 10-325 MG per tablet  Commonly known as:  NORCO  Take 1 tablet by mouth 2 (two) times daily as needed.     metoprolol 200 MG 24 hr tablet  Commonly known as:  TOPROL-XL  Take 1 tablet (200 mg total) by mouth daily.     nitroGLYCERIN 0.4 MG SL tablet  Commonly known as:  NITROSTAT  Place 0.4 mg under the tongue every 5 (five) minutes as needed.     omeprazole 40 MG capsule  Commonly known as:  PRILOSEC  Take 1 capsule (40 mg total) by mouth daily.     potassium chloride SA 20 MEQ tablet  Commonly known as:  K-DUR,KLOR-CON  Take 3 tablets (60 mEq total) by mouth daily.     traZODone 50 MG tablet  Commonly known as:  DESYREL  TAKE 1/2 TO 1 TABLET AT BEDTIME AS NEEDED FOR SLEEP     TURMERIC PO  Take 4 capsules by mouth daily.           Objective:   Physical Exam BP 118/64 mmHg  Pulse 43  Temp(Src) 98.1 F (36.7 C) (Oral)  Ht  (1.778 m)  Wt 272 lb 4 oz (123.492 kg)  BMI 39.06 kg/m2  SpO2 92% General:   Well developed, well nourished . NAD.  Neck:  Full range of motion. Supple. No  Thyromegaly  HEENT:  Normocephalic . Face symmetric, atraumatic Lungs:  CTA B Normal respiratory effort, no intercostal retractions, no accessory muscle  use. Heart: RRR,  no murmur.  Abdomen:  Not distended, soft, non-tender. No rebound or rigidity. No mass,organomegaly Muscle skeletal: no pretibial edema bilaterally  Rectal:  External abnormalities: none. Normal sphincter tone. No rectal masses or tenderness.  Stool brown  Prostate: Prostate gland firm and smooth, slt enlargement, no  nodularity, tenderness, mass, asymmetry or  induration.  Skin: Exposed areas without rash. Not pale. Not jaundice Neurologic:  alert & oriented X3.  Speech normal, gait appropriate for age and unassisted Strength symmetric and appropriate for age.  Psych: Cognition and judgment appear intact.  Cooperative with normal attention span and concentration.  Behavior appropriate. No anxious or depressed appearing.        Assessment & Plan:

## 2015-04-25 NOTE — Assessment & Plan Note (Signed)
Takes Desyrel 1 tablet daily, melatonin 5 mg. Reports that he could sleep better, we agreed to increase melatonin 10 mg at night and continue with Desyrel. Reassess in 6 months

## 2015-04-25 NOTE — Assessment & Plan Note (Signed)
Still smoking, counseled

## 2015-04-25 NOTE — Progress Notes (Signed)
Pre visit review using our clinic review tool, if applicable. No additional management support is needed unless otherwise documented below in the visit note. 

## 2015-04-25 NOTE — Assessment & Plan Note (Signed)
Borderline elevated TSH before, recheck a TSH

## 2015-04-25 NOTE — Assessment & Plan Note (Signed)
Per cardiology, he is somewhat bradycardic but asymptomatic.

## 2015-04-25 NOTE — Patient Instructions (Signed)
Get your blood work before you leave      Come back to the office in 6 months   for a routine check up     Fall Prevention and Home Safety Falls cause injuries and can affect all age groups. It is possible to use preventive measures to significantly decrease the likelihood of falls. There are many simple measures which can make your home safer and prevent falls. OUTDOORS  Repair cracks and edges of walkways and driveways.  Remove high doorway thresholds.  Trim shrubbery on the main path into your home.  Have good outside lighting.  Clear walkways of tools, rocks, debris, and clutter.  Check that handrails are not broken and are securely fastened. Both sides of steps should have handrails.  Have leaves, snow, and ice cleared regularly.  Use sand or salt on walkways during winter months.  In the garage, clean up grease or oil spills. BATHROOM  Install night lights.  Install grab bars by the toilet and in the tub and shower.  Use non-skid mats or decals in the tub or shower.  Place a plastic non-slip stool in the shower to sit on, if needed.  Keep floors dry and clean up all water on the floor immediately.  Remove soap buildup in the tub or shower on a regular basis.  Secure bath mats with non-slip, double-sided rug tape.  Remove throw rugs and tripping hazards from the floors. BEDROOMS  Install night lights.  Make sure a bedside light is easy to reach.  Do not use oversized bedding.  Keep a telephone by your bedside.  Have a firm chair with side arms to use for getting dressed.  Remove throw rugs and tripping hazards from the floor. KITCHEN  Keep handles on pots and pans turned toward the center of the stove. Use back burners when possible.  Clean up spills quickly and allow time for drying.  Avoid walking on wet floors.  Avoid hot utensils and knives.  Position shelves so they are not too high or low.  Place commonly used objects within easy  reach.  If necessary, use a sturdy step stool with a grab bar when reaching.  Keep electrical cables out of the way.  Do not use floor polish or wax that makes floors slippery. If you must use wax, use non-skid floor wax.  Remove throw rugs and tripping hazards from the floor. STAIRWAYS  Never leave objects on stairs.  Place handrails on both sides of stairways and use them. Fix any loose handrails. Make sure handrails on both sides of the stairways are as long as the stairs.  Check carpeting to make sure it is firmly attached along stairs. Make repairs to worn or loose carpet promptly.  Avoid placing throw rugs at the top or bottom of stairways, or properly secure the rug with carpet tape to prevent slippage. Get rid of throw rugs, if possible.  Have an electrician put in a light switch at the top and bottom of the stairs. OTHER FALL PREVENTION TIPS  Wear low-heel or rubber-soled shoes that are supportive and fit well. Wear closed toe shoes.  When using a stepladder, make sure it is fully opened and both spreaders are firmly locked. Do not climb a closed stepladder.  Add color or contrast paint or tape to grab bars and handrails in your home. Place contrasting color strips on first and last steps.  Learn and use mobility aids as needed. Install an electrical emergency response system.  Turn   on lights to avoid dark areas. Replace light bulbs that burn out immediately. Get light switches that glow.  Arrange furniture to create clear pathways. Keep furniture in the same place.  Firmly attach carpet with non-skid or double-sided tape.  Eliminate uneven floor surfaces.  Select a carpet pattern that does not visually hide the edge of steps.  Be aware of all pets. OTHER HOME SAFETY TIPS  Set the water temperature for 120 F (48.8 C).  Keep emergency numbers on or near the telephone.  Keep smoke detectors on every level of the home and near sleeping areas. Document Released:  11/30/2002 Document Revised: 06/10/2012 Document Reviewed: 02/29/2012 ExitCare Patient Information 2015 [employer]. This information is not intended to replace advice given to you by your health care provider. Make sure you discuss any questions you have with your health care provider.   Preventive Care for Adults Ages 65 and over  Blood pressure check.** / Every 1 to 2 years.  Lipid and cholesterol check.**/ Every 5 years beginning at age 20.  Lung cancer screening. / Every year if you are aged 55-80 years and have a 30-pack-year history of smoking and currently smoke or have quit within the past 15 years. Yearly screening is stopped once you have quit smoking for at least 15 years or develop a health problem that would prevent you from having lung cancer treatment.  Fecal occult blood test (FOBT) of stool. / Every year beginning at age 50 and continuing until age 75. You may not have to do this test if you get a colonoscopy every 10 years.  Flexible sigmoidoscopy** or colonoscopy.** / Every 5 years for a flexible sigmoidoscopy or every 10 years for a colonoscopy beginning at age 50 and continuing until age 75.  Hepatitis C blood test.** / For all people born from 1945 through 1965 and any individual with known risks for hepatitis C.  Abdominal aortic aneurysm (AAA) screening.** / A one-time screening for ages 65 to 75 years who are current or former smokers.  Skin self-exam. / Monthly.  Influenza vaccine. / Every year.  Tetanus, diphtheria, and acellular pertussis (Tdap/Td) vaccine.** / 1 dose of Td every 10 years.  Varicella vaccine.** / Consult your health care provider.  Zoster vaccine.** / 1 dose for adults aged 60 years or older.  Pneumococcal 13-valent conjugate (PCV13) vaccine.** / Consult your health care provider.  Pneumococcal polysaccharide (PPSV23) vaccine.** / 1 dose for all adults aged 65 years and older.  Meningococcal vaccine.** / Consult your health care  provider.  Hepatitis A vaccine.** / Consult your health care provider.  Hepatitis B vaccine.** / Consult your health care provider.  Haemophilus influenzae type b (Hib) vaccine.** / Consult your health care provider. **Family history and personal history of risk and conditions may change your health care provider's recommendations. Document Released: 02/05/2002 Document Revised: 12/15/2013 Document Reviewed: 05/07/2011 ExitCare Patient Information 2015 [employer]. This information is not intended to replace advice given to you by your health care provider. Make sure you discuss any questions you have with your health care provider.   

## 2015-05-03 NOTE — Addendum Note (Signed)
Addended by: Dorette GrateFAULKNER, Cindee Mclester C on: 05/03/2015 11:57 AM   Modules accepted: Orders

## 2015-05-06 ENCOUNTER — Telehealth: Payer: Self-pay

## 2015-05-06 NOTE — Telephone Encounter (Signed)
UDS: 04/25/2015  Positive for Norco  Low risk per Dr. Drue NovelPaz 05/06/2015

## 2015-05-27 ENCOUNTER — Telehealth: Payer: Self-pay | Admitting: Internal Medicine

## 2015-05-27 NOTE — Telephone Encounter (Signed)
Both HCTZ and Potassium was ref[facility] [address] on 04/25/2015, Pt needs to call [facility] for refills.

## 2015-05-27 NOTE — Telephone Encounter (Signed)
Informed patient of this.  °

## 2015-05-27 NOTE — Telephone Encounter (Signed)
Caller name: Azahel Relation to pt: self Call back number: [phone] [facility] [address]  Reason for call:   Requesting refills hydrochlorothiazide and potassium

## 2015-07-20 ENCOUNTER — Telehealth: Payer: Self-pay | Admitting: Internal Medicine

## 2015-07-20 ENCOUNTER — Other Ambulatory Visit: Payer: Self-pay

## 2015-07-20 MED ORDER — HYDROCODONE-ACETAMINOPHEN 10-325 MG PO TABS: 1.0000 | ORAL_TABLET | Freq: Two times a day (BID) | ORAL | Status: DC | PRN

## 2015-07-20 NOTE — Telephone Encounter (Signed)
Pt is requesting refill on Hydrocodone.  Last OV: 04/25/2015 Last Fill: 04/22/2015 #180 0RF UDS: 04/25/2015 Low risk  Please advise.

## 2015-07-20 NOTE — Telephone Encounter (Signed)
See rx. 

## 2015-07-20 NOTE — Telephone Encounter (Signed)
Please inform Pt that Rx is ready for pick up, however, it is only a 30 day supply as Dr. Drue Novel is out of the office and another provider is covering refills. Thank you.

## 2015-07-20 NOTE — Telephone Encounter (Signed)
Caller name:Beverley Relationship to patient:self Can be reached:[phone] Pharmacy:  Reason for call: hydrocodone

## 2015-08-07 ENCOUNTER — Other Ambulatory Visit: Payer: Self-pay | Admitting: Internal Medicine

## 2015-08-08 NOTE — Telephone Encounter (Signed)
Pt is requesting refill on Trazodone.  Last OV: 04/25/2015 Last Fill: 03/07/2015 #30 4RF UDS: 04/25/2015 Low risk  Please advise.

## 2015-08-08 NOTE — Telephone Encounter (Signed)
Rx sent 

## 2015-08-08 NOTE — Telephone Encounter (Signed)
Ok 60 and 6 RF 

## 2015-08-22 ENCOUNTER — Telehealth: Payer: Self-pay | Admitting: Internal Medicine

## 2015-08-22 MED ORDER — HYDROCODONE-ACETAMINOPHEN 10-325 MG PO TABS: 1.0000 | ORAL_TABLET | Freq: Two times a day (BID) | ORAL | Status: DC | PRN

## 2015-08-22 NOTE — Telephone Encounter (Signed)
Patient informed. 

## 2015-08-22 NOTE — Telephone Encounter (Signed)
Please inform Pt that Rx is ready for pick up at the front desk. Rx written for 90 day supply. Last month was not written as such because Melissa filled in Dr. Leta Jungling absence. Thank you.

## 2015-08-22 NOTE — Telephone Encounter (Signed)
Relation to pt: self Call back number: [phone]   Reason for call:  Patient requesting a 3 month supply of HYDROcodone-acetaminophen (NORCO) 10-325 MG per tablet, patient states commuting every month is a burden, patient would like to speak with CMA directly if 3 month supply is not written.

## 2015-08-22 NOTE — Telephone Encounter (Signed)
Ok #180

## 2015-08-22 NOTE — Telephone Encounter (Signed)
Pt is requesting refill on Hydrocodone. Requesting 90 day supply.  Last OV: 04/25/2015 Last Filled by Melissa: 07/20/2015 #60 0RF UDS: 04/25/2015 Low risk  Please advise.

## 2015-08-22 NOTE — Telephone Encounter (Signed)
Rx printed, awaiting MD signature.  

## 2015-09-02 ENCOUNTER — Ambulatory Visit (INDEPENDENT_AMBULATORY_CARE_PROVIDER_SITE_OTHER): Payer: Medicare Other | Admitting: Nurse Practitioner

## 2015-09-02 ENCOUNTER — Telehealth: Payer: Self-pay | Admitting: Internal Medicine

## 2015-09-02 VITALS — BP 120/80 | HR 81 | Wt 268.4 lb

## 2015-09-02 DIAGNOSIS — I480 Paroxysmal atrial fibrillation: Secondary | ICD-10-CM

## 2015-09-02 NOTE — Telephone Encounter (Signed)
I reviewed complaint with Dr. Clifton James, DOD who advised that patient come in for an ekg to confirm rhythm.  He reviewed medications and is aware patient is not on an anti-coagulant and advised that if ekg is confirmed atrial fib to send message to Dr. Graciela Husbands for advice regarding anti-coag agent.  I left message for patient to call me back concerning appointment for ekg.

## 2015-09-02 NOTE — Telephone Encounter (Signed)
Reviewed plan with patient who verbalized understanding and agreement to come in as soon as he can get a ride for ekg. I attempted to call Dr. Graciela Husbands to make him aware of the patient complaint; left message on his voicemail

## 2015-09-02 NOTE — Telephone Encounter (Signed)
New message      Pt states he has irr heart beat that started last night.  He denies cp, sob or dizziness.  His HR is around 80.  He did not take his bp. Please advise

## 2015-09-02 NOTE — Patient Instructions (Signed)
Medication Instructions:  Your physician recommends that you continue on your current medications as directed. Please refer to the Current Medication list given to you today.   Labwork: None Ordered   Testing/Procedures: None Ordered   Follow-Up: Your physician recommends that you follow-up with Dr. Graciela Husbands - someone from our office will call you next week

## 2015-09-02 NOTE — Progress Notes (Signed)
Patient in for EKG to check for Afib.  History of PAF felt like HR became irregular yesterday.  Advised on phone to come in for EKG.  EKG positive for Afib . Taken to Dr Clifton James, DOD who advised patient should F/U with primary cardiologist, Dr Graciela Husbands.  Reviewed with patient that all information will be given to Dr Graciela Husbands on Monday.  Someone from his team will call him back next week.

## 2015-09-02 NOTE — Telephone Encounter (Signed)
Spoke with patient who states he was out walking last night and noticed irregular heart rate.  He takes his medications at erratic times due to an abnormal sleeping schedule and hx of insomnia.  He states he takes the bid medications 12 hours apart and confirms that he takes Diltiazem,Toprol XL (he splits the pill and takes 1/2 every 12 hours), and Flecainide.  He states he noticed this morning that his heart rate continues to be irregular.  He denies SOB, n/v, dizziness, or light-headedness.  He states he does notice some pains in his chest when he lays down but believes these pains are gas pains due to the fact that "they eventually find their way out" and pain dissipates.   He is concerned about going through the weekend in a fib.  I advised him that Dr. Graciela Husbands is not in the office today but that I will review with DOD and/or FLEX APP and call him back with their advice.  He verbalized understanding and agreement.

## 2015-09-05 ENCOUNTER — Telehealth: Payer: Self-pay | Admitting: Internal Medicine

## 2015-09-05 NOTE — Telephone Encounter (Signed)
New message     Patient had EKG on 9.9.2016 was told to call back.

## 2015-09-05 NOTE — Telephone Encounter (Signed)
I called and spoke with the patient. I advised him that I have received his information from his nurse visit on Friday and that I am waiting to review with Dr. Graciela Husbands when he is here in the office this afternoon. He has had no changes over the weekend with his symptoms and feels that his heart rhythm is still irregular. He confirms he is currently not on a blood thinner. I explained I will call him back later today (after 2 pm) with a plan. He is agreeable.

## 2015-09-05 NOTE — Telephone Encounter (Signed)
Reviewed with Dr. Graciela Husbands. Recommendations are to start Eliquis 5 mg BID. If symptomatic with rhythm, he can come in this week to be seen for work up for TEE/ DCCV or If not symptomatic, he can start Eliquis and come in for follow up in about 3 weeks and be set up for DCCV. I attempted to call the patient. I left a message for him to call me back in the AM or I will try him back when I can tomorrow.

## 2015-09-05 NOTE — Telephone Encounter (Signed)
I left the patient a message to call back.  Cancellation on Dr. Odessa Fleming schedule at 3:00 pm today. Asked patient to call back if he wanted to come in.

## 2015-09-06 ENCOUNTER — Encounter: Payer: Self-pay | Admitting: *Deleted

## 2015-09-06 MED ORDER — APIXABAN 5 MG PO TABS: 5.0000 mg | ORAL_TABLET | Freq: Two times a day (BID) | ORAL | Status: DC

## 2015-09-06 NOTE — Telephone Encounter (Signed)
I spoke with the patient and made him aware of Dr. Odessa Fleming recommendations. At this time, he is not having symptoms with his a-fib. He still feels that he is out of rhythm today. Highest HR has been 100 bpm and lowest has been in the 60's. Average has been 80-85 bpm. He is agreeable with starting Eliquis 5 mg BID. Per Dr. Graciela Husbands - ok to stop aspirin. I will schedule him to come into the office in about 3 weeks for work up for DCCV. Appointment has been scheduled for 09/28/15 with Gypsy Balsam, NP.

## 2015-09-09 ENCOUNTER — Telehealth: Payer: Self-pay | Admitting: Internal Medicine

## 2015-09-09 NOTE — Telephone Encounter (Signed)
I left a message for the patient that he maintain his daily glass of wine/ beer.

## 2015-09-09 NOTE — Telephone Encounter (Signed)
New message     Pt is on eliquis.  He normally has a glass of wine or beer daily.  Can he still do this while on eliquis?

## 2015-09-26 ENCOUNTER — Encounter: Payer: Self-pay | Admitting: *Deleted

## 2015-09-28 ENCOUNTER — Encounter: Payer: Self-pay | Admitting: *Deleted

## 2015-09-28 ENCOUNTER — Ambulatory Visit (INDEPENDENT_AMBULATORY_CARE_PROVIDER_SITE_OTHER): Payer: Medicare Other | Admitting: Nurse Practitioner

## 2015-09-28 ENCOUNTER — Encounter: Payer: Self-pay | Admitting: Nurse Practitioner

## 2015-09-28 VITALS — BP 118/84 | HR 76 | Ht 70.0 in | Wt 270.2 lb

## 2015-09-28 DIAGNOSIS — I481 Persistent atrial fibrillation: Secondary | ICD-10-CM

## 2015-09-28 DIAGNOSIS — G478 Other sleep disorders: Secondary | ICD-10-CM | POA: Diagnosis not present

## 2015-09-28 DIAGNOSIS — I10 Essential (primary) hypertension: Secondary | ICD-10-CM | POA: Diagnosis not present

## 2015-09-28 DIAGNOSIS — I4819 Other persistent atrial fibrillation: Secondary | ICD-10-CM

## 2015-09-28 DIAGNOSIS — G4730 Sleep apnea, unspecified: Secondary | ICD-10-CM

## 2015-09-28 MED ORDER — APIXABAN 5 MG PO TABS: 5.0000 mg | ORAL_TABLET | Freq: Two times a day (BID) | ORAL | Status: DC

## 2015-09-28 NOTE — Patient Instructions (Addendum)
Medication Instructions:   Your physician recommends that you continue on your current medications as directed. Please refer to the Current Medication list given to you today.'    Labwork: NONE ORDER TODAY'    Testing/Procedures:  NONE ORDER TODAY   Follow-Up:  IN 4 WEEKS WITH  A FIB CLINIC   Any Other Special Instructions Will Be Listed Below (If Applicable).

## 2015-09-28 NOTE — Progress Notes (Signed)
Electrophysiology Office Note Date: 09/28/2015  ID:  Warren [name], DOB 1946/07/23, MRN [id number]  PCP: Willow Ora, MD Electrophysiologist: Graciela Husbands  CC: atrial fibrillation  Warren [name] is a 69 y.o. male seen today for Dr Graciela Husbands.  He has a longstanding history of atrial fibrillation and has been maintained on Flecainide, Metoprolol, and Cardizem with good control.  He also has a history of SVT and is s/p left lateral pathway ablation in 2002.  He developed recurrent atrial fibrillation and called the office for recommendations. With minimal symptoms, he was started on Eliquis and presents today to discuss cardioversion.  He has persistent fatigue and insomnia but overall feels well.  He denies chest pain, palpitations, dyspnea, PND, orthopnea, nausea, vomiting, dizziness, syncope.  Last echo 2010 demonstrated EF 65-70%, LA 30.   Past Medical History  Diagnosis Date  . Hypertension   . Supraventricular tachycardia (HCC)     a. s/p concealed left lateral pathway ablation 2002 Dr Graciela Husbands  . Persistent atrial fibrillation (HCC)   . Increased prostate specific antigen (PSA) velocity   . Morbid obesity (HCC)   . Sleep-disordered breathing   . Hypothyroidism    Past Surgical History  Procedure Laterality Date  . Appendectomy    . Cardiac catheterization  2001    NO CAD  . Ablation  2002    concealed left lateral pathway ablation by Dr Graciela Husbands  . Tonsillectomy    . Nm myoview ltd      12-09    Current Outpatient Prescriptions  Medication Sig Dispense Refill  . apixaban (ELIQUIS) 5 MG TABS tablet Take 1 tablet (5 mg total) by mouth 2 (two) times daily. 60 tablet 0  . co-enzyme Q-10 30 MG capsule Take 30 mg by mouth daily.     . COD LIVER OIL PO Take 2 tablets by mouth daily.     Marland Kitchen diltiazem (CARDIZEM CD) 120 MG 24 hr capsule Take 1 capsule (120 mg total) by mouth daily. 30 capsule 8  . fish oil-omega-3 fatty acids 1000 MG capsule Take 2 g by mouth daily.      . flecainide  (TAMBOCOR) 150 MG tablet Take 1 tablet (150 mg total) by mouth 2 (two) times daily. 60 tablet 11  . hydrochlorothiazide (HYDRODIURIL) 25 MG tablet Take 1 tablet (25 mg total) by mouth daily. 30 tablet 8  . HYDROcodone-acetaminophen (NORCO) 10-325 MG per tablet Take 1 tablet by mouth 2 (two) times daily as needed. 180 tablet 0  . metoprolol (TOPROL-XL) 200 MG 24 hr tablet Take 1 tablet (200 mg total) by mouth daily. 30 tablet 8  . Multiple Vitamins-Minerals (CENTRUM SILVER PO) Take 1 each by mouth daily.      . nitroGLYCERIN (NITROSTAT) 0.4 MG SL tablet Place 0.4 mg under the tongue every 5 (five) minutes as needed.      Marland Kitchen omeprazole (PRILOSEC) 40 MG capsule Take 1 capsule (40 mg total) by mouth daily. 30 capsule 8  . potassium chloride SA (K-DUR,KLOR-CON) 20 MEQ tablet Take 3 tablets (60 mEq total) by mouth daily. 90 tablet 8  . traZODone (DESYREL) 50 MG tablet Take 0.5-1 tablets (25-50 mg total) by mouth at bedtime as needed for sleep. 60 tablet 6  . TURMERIC PO Take 4 capsules by mouth daily.       No current facility-administered medications for this visit.    Allergies:   Review of patient's allergies indicates no known allergies.   Social History: Social History   Social History  .  Marital Status: Single    Spouse Name: N/A  . Number of Children: 0  . Years of Education: N/A   Occupational History  . 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 Smoking status: Current Every Day Smoker    Types: Cigars  . Smokeless tobacco: Not on file     Comment: HAS 2 LARGE CIGARS A DAY  . Alcohol Use: Yes     Comment: RARE  . Drug Use: Not on file  . Sexual Activity: Not on file   Other Topics Concern  . Not on file   Social History Narrative   Lives by himself , has no family, did not provide  any contact name for emergencies                  Family History: Family History  Problem Relation Age of Onset  . Adopted: Yes  . Family  history unknown: Yes    Review of Systems: All other systems reviewed and are otherwise negative except as noted above.   Physical Exam: VS:  BP 118/84 mmHg  Pulse 76  Ht  (1.778 m)  Wt 270 lb 3.2 oz (122.562 kg)  BMI 38.77 kg/m2  SpO2 98% , BMI Body mass index is 38.77 kg/(m^2). Wt Readings from Last 3 Encounters:  09/28/15 270 lb 3.2 oz (122.562 kg)  09/02/15 268 lb 6.4 oz (121.745 kg)  04/25/15 272 lb 4 oz (123.492 kg)    GEN- The patient is obese and disheveled appearing, alert and oriented x 3 today, very strong nicotine odor with nicotine stained clothing HEENT: normocephalic, atraumatic; sclera clear, conjunctiva pink; hearing intact; oropharynx clear; neck supple  Lungs- Clear to ausculation bilaterally, normal work of breathing.  No wheezes, rales, rhonchi Heart- Regular rate and rhythm  GI- obese, non-tender, non-distended, bowel sounds present Extremities- no clubbing, cyanosis, or edema; DP/PT/radial pulses 1+ bilaterally MS- no significant deformity or atrophy Skin- warm and dry, no rash or lesion  Psych- euthymic mood, full affect Neuro- strength and sensation are intact   EKG:  EKG is ordered today. The ekg ordered today shows atypical atrial flutter, ventricular rate 76  Recent Labs: 10/25/2014: Magnesium 2.2 04/25/2015: BUN 21; Creatinine, Ser 1.42; Potassium 4.1; Sodium 140; TSH 6.28*    Other studies Reviewed: Additional studies/ records that were reviewed today include: Dr Odessa Fleming notes, last echo  Assessment and Plan: 1.  Persistent atrial fibrillation The patient has recurrent persistent atrial fibrillation in the setting of Flecainide use Continue Eliquis for CHADS2VASC of at least 2 Discussed treatment options with patient today. He would like to defer cardioversion for now. Will revisit in 4 weeks.   Ultimately, lifestyle modification will be his best treatment option. Discussed weight loss and treatment of sleep apnea today Will update  echo at next visit to re-evaluate LA size, although with obesity, I do not think he would be an ablation candidate  2.  HTN Stable No change required today  3.  Obesity Weight loss will be required for long term success in managing atrial fibrillation  4.  Sleep disordered breathing Evaluation of sleep apnea discussed with patient previously who declined work up Again discussed relationship of sleep apnea to atrial arrhythmias Pt is not willing to have sleep study done at this time  5.  Insomnia The patient asks about adding Xanax today I have advised follow up with PCP   Current medicines are reviewed at  length with the patient today.   The patient does not have concerns regarding his medicines.  The following changes were made today:  none  Labs/ tests ordered today include: none  Disposition:   Follow up with Dr Graciela Husbands or AF clinic in 4 weeks to discuss DCCV   Signed, Gypsy Balsam, NP 09/28/2015 2:10 PM   [facility] [address] [place] [address] [phone] (office) [phone] (fax)

## 2015-10-26 ENCOUNTER — Ambulatory Visit (HOSPITAL_COMMUNITY): Payer: Medicare Other | Admitting: Nurse Practitioner

## 2015-10-31 ENCOUNTER — Ambulatory Visit (INDEPENDENT_AMBULATORY_CARE_PROVIDER_SITE_OTHER): Payer: Medicare Other | Admitting: Internal Medicine

## 2015-10-31 ENCOUNTER — Encounter: Payer: Self-pay | Admitting: Internal Medicine

## 2015-10-31 VITALS — BP 115/80 | HR 89 | Temp 98.0°F | Wt 273.0 lb

## 2015-10-31 DIAGNOSIS — I10 Essential (primary) hypertension: Secondary | ICD-10-CM

## 2015-10-31 DIAGNOSIS — I4891 Unspecified atrial fibrillation: Secondary | ICD-10-CM | POA: Diagnosis not present

## 2015-10-31 DIAGNOSIS — R319 Hematuria, unspecified: Secondary | ICD-10-CM

## 2015-10-31 DIAGNOSIS — Z23 Encounter for immunization: Secondary | ICD-10-CM

## 2015-10-31 DIAGNOSIS — Z09 Encounter for follow-up examination after completed treatment for conditions other than malignant neoplasm: Secondary | ICD-10-CM

## 2015-10-31 DIAGNOSIS — E038 Other specified hypothyroidism: Secondary | ICD-10-CM

## 2015-10-31 DIAGNOSIS — G4700 Insomnia, unspecified: Secondary | ICD-10-CM

## 2015-10-31 LAB — BASIC METABOLIC PANEL
BUN: 23 mg/dL (ref 6–23)
CO2: 30 mEq/L (ref 19–32)
Calcium: 9.4 mg/dL (ref 8.4–10.5)
Chloride: 102 mEq/L (ref 96–112)
Creatinine, Ser: 1.37 mg/dL (ref 0.40–1.50)
GFR: 54.49 mL/min — AB (ref >60.00)
GLUCOSE: 100 mg/dL — AB (ref 70–99)
POTASSIUM: 4.1 meq/L (ref 3.5–5.1)
Sodium: 142 mEq/L (ref 135–145)

## 2015-10-31 LAB — URINALYSIS, ROUTINE W REFLEX MICROSCOPIC
Bilirubin Urine: NEGATIVE
HGB URINE DIPSTICK: NEGATIVE
LEUKOCYTES UA: NEGATIVE
Nitrite: NEGATIVE
PH: 5.5 (ref 5.0–8.0)
RBC / HPF: NONE SEEN (ref 0–?)
Specific Gravity, Urine: >=1.03 — AB (ref 1.000–1.030)
TOTAL PROTEIN, URINE-UPE24: NEGATIVE
URINE GLUCOSE: NEGATIVE
UROBILINOGEN UA: 0.2 (ref 0.0–1.0)

## 2015-10-31 LAB — TSH: TSH: 7.62 u[IU]/mL — AB (ref 0.35–4.50)

## 2015-10-31 LAB — T3, FREE: T3, Free: 3.3 pg/mL (ref 2.3–4.2)

## 2015-10-31 LAB — T4, FREE: Free T4: 1.16 ng/dL (ref 0.60–1.60)

## 2015-10-31 NOTE — Patient Instructions (Addendum)
Get your blood work before you leave    Next visit  for a   routine checkup in 4 months (30 minutes) Please schedule an appointment at the front desk

## 2015-10-31 NOTE — Progress Notes (Signed)
Pre visit review using our clinic review tool, if applicable. No additional management support is needed unless otherwise documented below in the visit note. 

## 2015-10-31 NOTE — Progress Notes (Signed)
Subjective:    Patient ID: Warren [name], male    DOB: 02/10/1946, 69 y.o.   MRN: [id number]  DOS:  10/31/2015 Type of visit - description : Routine visit Interval history: Atrial fibrillation: Recently seen at the cardiology office and started anticoagulants. Blood in the urine? On and off for the last 3 weeks has noted  his urine to be reddish and  wonders if he has blood there. HTN: Compliance of medication, ambulatory BPs are checked sometimes and there usually "normal". Insomnia: Still an issue, reports he sleeps 5 hours q night, he continue feeling fatigue.  Review of Systems  No fever chills. No chest pain or difficulty breathing, occasional palpitations. No nausea vomiting. No flank pain. No diff urinating or dysuria   Past Medical History  Diagnosis Date  . Hypertension   . Supraventricular tachycardia (HCC)     a. s/p concealed left lateral pathway ablation 2002 Dr Graciela Husbands  . Persistent atrial fibrillation (HCC)   . Increased prostate specific antigen (PSA) velocity   . Morbid obesity (HCC)   . Sleep-disordered breathing   . Hypothyroidism     Past Surgical History  Procedure Laterality Date  . Appendectomy    . Cardiac catheterization  2001    NO CAD  . Ablation  2002    concealed left lateral pathway ablation by Dr Graciela Husbands  . Tonsillectomy    . Nm myoview ltd      12-09    Social History   Social History  . Marital Status: Single    Spouse Name: N/A  . Number of Children: 0  . Years of Education: N/A   Occupational History  . 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 Smoking status: Current Every Day Smoker    Types: Cigars  . Smokeless tobacco: Not on file     Comment: HAS 2 LARGE CIGARS A DAY  . Alcohol Use: Yes     Comment: RARE  . Drug Use: Not on file  . Sexual Activity: Not on file   Other Topics Concern  . Not on file   Social History Narrative   Lives by himself , has no family, did not  provide  any contact name for emergencies                      Medication List       This list is accurate as of: 10/31/15  5:20 PM.  Always use your most recent med list.               apixaban 5 MG Tabs tablet  Commonly known as:  ELIQUIS  Take 1 tablet (5 mg total) by mouth 2 (two) times daily.     CENTRUM SILVER PO  Take 1 each by mouth daily.     co-enzyme Q-10 30 MG capsule  Take 30 mg by mouth daily.     COD LIVER OIL PO  Take 2 tablets by mouth daily.     diltiazem 120 MG 24 hr capsule  Commonly known as:  CARDIZEM CD  Take 1 capsule (120 mg total) by mouth daily.     fish oil-omega-3 fatty acids 1000 MG capsule  Take 2 g by mouth daily.     flecainide 150 MG tablet  Commonly known as:  TAMBOCOR  Take 1 tablet (150 mg total) by mouth 2 (two) times daily.     hydrochlorothiazide  25 MG tablet  Commonly known as:  HYDRODIURIL  Take 1 tablet (25 mg total) by mouth daily.     HYDROcodone-acetaminophen 10-325 MG tablet  Commonly known as:  NORCO  Take 1 tablet by mouth 2 (two) times daily as needed.     metoprolol 200 MG 24 hr tablet  Commonly known as:  TOPROL-XL  Take 1 tablet (200 mg total) by mouth daily.     nitroGLYCERIN 0.4 MG SL tablet  Commonly known as:  NITROSTAT  Place 0.4 mg under the tongue every 5 (five) minutes as needed.     omeprazole 40 MG capsule  Commonly known as:  PRILOSEC  Take 1 capsule (40 mg total) by mouth daily.     potassium chloride SA 20 MEQ tablet  Commonly known as:  K-DUR,KLOR-CON  Take 3 tablets (60 mEq total) by mouth daily.     traZODone 50 MG tablet  Commonly known as:  DESYREL  Take 0.5-1 tablets (25-50 mg total) by mouth at bedtime as needed for sleep.     TURMERIC PO  Take 4 capsules by mouth daily.           Objective:   Physical Exam BP 115/80 mmHg  Pulse 89  Temp(Src) 98 F (36.7 C)  Wt 273 lb (123.832 kg)  SpO2 93% General:   Well developed, well nourished . NAD.  HEENT:    Normocephalic . Face symmetric, atraumatic Lungs:  CTA B Normal respiratory effort, no intercostal retractions, no accessory muscle use. Heart: Irregularly irregular,  no murmur.  No pretibial edema bilaterally  Skin: Not pale. Not jaundice Neurologic:  alert & oriented X3.  Speech normal, gait appropriate for age and unassisted Psych--  Cognition and judgment appear intact.  Cooperative with normal attention span and concentration.  Behavior appropriate. No anxious or depressed appearing.      Assessment & Plan:   Assessment > HTN Hypothyroidism SVT: Cardiac cath 2001: No CAD, ablation 2002 Persistent atrial fibrillation -- on Eliquis started 08-2015 Insomnia -- Xanax was dc 2015 d/t requiring increasing doses, was changed to Ativan: didn't work. Was switch to Ambien: He had drowsiness the following day H/o  Fatigue Sleep-disordered suspected OSA: declined to have a sleep study Increased PSA velocity: Consistently declined to see urology Obesity   Plan: HTN: Well-controlled, check a BMP Hypothyroidism: Last TSH is slightly elevated, recheck TSH, free T3, free T4. Consider treatment keping in mind that he has atrial fibrillation. Atrial fibrillation: Needs to see cardiology, will enter a referral Insomnia: Chronic issue, continue Desyrel from now, other meds have not worked for him. See above. Hematuria?: Check labs RTC 4 months

## 2015-10-31 NOTE — Assessment & Plan Note (Signed)
HTN: Well-controlled, check a BMP Hypothyroidism: Last TSH is slightly elevated, recheck TSH, free T3, free T4. Consider treatment keping in mind that he has atrial fibrillation. Atrial fibrillation: Needs to see cardiology, will enter a referral Insomnia: Chronic issue, continue Desyrel from now, other meds have not worked for him. See above. Hematuria?: Check labs RTC 4 months

## 2015-11-01 ENCOUNTER — Encounter: Payer: Self-pay | Admitting: Internal Medicine

## 2015-11-01 LAB — URINE CULTURE

## 2015-11-02 ENCOUNTER — Other Ambulatory Visit: Payer: Self-pay | Admitting: Internal Medicine

## 2015-11-02 MED ORDER — LEVOTHYROXINE SODIUM 25 MCG PO TABS: 25.0000 ug | ORAL_TABLET | Freq: Every day | ORAL | Status: DC

## 2015-11-04 NOTE — Addendum Note (Signed)
Addended by: Dorette GrateFAULKNER, Dashun Borre C on: 11/04/2015 09:26 AM   Modules accepted: Orders

## 2015-11-15 ENCOUNTER — Ambulatory Visit (INDEPENDENT_AMBULATORY_CARE_PROVIDER_SITE_OTHER): Payer: Medicare Other | Admitting: Internal Medicine

## 2015-11-15 ENCOUNTER — Encounter: Payer: Self-pay | Admitting: Internal Medicine

## 2015-11-15 VITALS — BP 139/102 | HR 87 | Ht 70.0 in | Wt 271.8 lb

## 2015-11-15 DIAGNOSIS — I4891 Unspecified atrial fibrillation: Secondary | ICD-10-CM | POA: Diagnosis not present

## 2015-11-15 NOTE — Patient Instructions (Signed)
Medication Instructions: - no changes  Labwork: - none  Procedures/Testing: - Your physician has requested that you have an echocardiogram- [facility]. Echocardiography is a painless test that uses sound waves to create images of your heart. It provides your doctor with information about the size and shape of your heart and how well your heart's chambers and valves are working. This procedure takes approximately one hour. There are no restrictions for this procedure.  Follow-Up: - Your physician recommends that you schedule a follow-up appointment in: 2 months with Gypsy BalsamAmber Seiler, NP for Dr. Graciela HusbandsKlein.  Any Additional Special Instructions Will Be Listed Below (If Applicable).

## 2015-11-15 NOTE — Progress Notes (Signed)
HPI  Warren [name] is a 69 y.o. male Seen in followup for atrial fibrillation in the context of hypertension. He takes flecainide Cardizem and aspirin was discontinued with the initiation of apixoban.  The patient was found to be in atrial fibrillation and saw AS NP. Echocardiogram was ordered.     intercurrently he has been diagnosed with hypothyroidism and Synthroid has been started. His his CHADS-  2 score is one and his CHADS-VASc score is 2.  He has no complaints of exercise intolerance edema or fatigue.  Sleep issues are being addressed.     Past Medical History  Diagnosis Date  . Hypertension   . Supraventricular tachycardia (HCC)     a. s/p concealed left lateral pathway ablation 2002 Dr Graciela HusbandsKlein  . Persistent atrial fibrillation (HCC)   . Increased prostate specific antigen (PSA) velocity   . Morbid obesity (HCC)   . Sleep-disordered breathing   . Hypothyroidism     Past Surgical History  Procedure Laterality Date  . Appendectomy    . Cardiac catheterization  2001    NO CAD  . Ablation  2002    concealed left lateral pathway ablation by Dr Graciela HusbandsKlein  . Tonsillectomy    . Nm myoview ltd      12-09    Current Outpatient Prescriptions  Medication Sig Dispense Refill  . apixaban (ELIQUIS) 5 MG TABS tablet Take 1 tablet (5 mg total) by mouth 2 (two) times daily. 60 tablet 6  . co-enzyme Q-10 30 MG capsule Take 30 mg by mouth daily.     . COD LIVER OIL PO Take 2 tablets by mouth daily.     Marland Kitchen. diltiazem (CARDIZEM CD) 120 MG 24 hr capsule Take 1 capsule (120 mg total) by mouth daily. 30 capsule 8  . fish oil-omega-3 fatty acids 1000 MG capsule Take 2 g by mouth daily.      . flecainide (TAMBOCOR) 150 MG tablet Take 1 tablet (150 mg total) by mouth 2 (two) times daily. 60 tablet 11  . hydrochlorothiazide (HYDRODIURIL) 25 MG tablet Take 1 tablet (25 mg total) by mouth daily. 30 tablet 8  . HYDROcodone-acetaminophen (NORCO) 10-325 MG per tablet Take 1 tablet by mouth 2  (two) times daily as needed. 180 tablet 0  . levothyroxine (LEVOTHROID) 25 MCG tablet Take 1 tablet (25 mcg total) by mouth daily before breakfast. 30 tablet 3  . metoprolol (TOPROL-XL) 200 MG 24 hr tablet Take 1 tablet (200 mg total) by mouth daily. 30 tablet 8  . Multiple Vitamins-Minerals (CENTRUM SILVER PO) Take 1 each by mouth daily.      . nitroGLYCERIN (NITROSTAT) 0.4 MG SL tablet Place 0.4 mg under the tongue every 5 (five) minutes as needed.      Marland Kitchen. omeprazole (PRILOSEC) 40 MG capsule Take 1 capsule (40 mg total) by mouth daily. 30 capsule 8  . potassium chloride SA (K-DUR,KLOR-CON) 20 MEQ tablet Take 3 tablets (60 mEq total) by mouth daily. 90 tablet 8  . traZODone (DESYREL) 50 MG tablet Take 0.5-1 tablets (25-50 mg total) by mouth at bedtime as needed for sleep. 60 tablet 6  . TURMERIC PO Take 4 capsules by mouth daily.       No current facility-administered medications for this visit.    No Known Allergies  Review of Systems negative except from HPI and PMH  Physical Exam BP 139/102 mmHg  Pulse 87  Ht 5\' 10"  (1.778 m)  Wt 271 lb 12.8 oz (123.288 kg)  BMI 39.00 kg/m2 Well developed and well nourished in no acute distress HENT normal E scleral and icterus clear Neck Supple JVP flat; carotids brisk and full Clear to ausculation  Regular rate and rhythm, no murmurs gallops or rub Soft with active bowel sounds No clubbing cyanosis nonea Edema Alert and oriented, grossly normal motor and sensory function Skin Warm and Dry  ECG demonstrates sinus rhythm at 42 Intervals 22/10/53 with a QTC of 44 Axis LIX   Assessment and  Plan  Persistent  atrial fibrillation  Hypertension  Sleep-disordered breathing  Morbidly obese  Sinus bradycardia  He has persistent atrial fibrillation. He is not sure that he would like to pursue cardioversion. We have discussed the time sensitive nature of pursuing this, i.e. atrial fibrillation be getting itself and at some point the  likelihood of being able to maintain sinus is significantly diminished. He thinks that his change in exercise tolerance is related to his rapid interval increase in weight; he is trying to work on that some more.  In the event that he chooses not to pursue cardioversion, we would discontinue his flecainide. We will arrange for an interim echo to look at left atrial size which would help stratify likelihood of maintaining sinus rhythm.

## 2015-11-16 ENCOUNTER — Other Ambulatory Visit: Payer: Self-pay | Admitting: *Deleted

## 2015-11-16 MED ORDER — FLECAINIDE ACETATE 150 MG PO TABS: 150.0000 mg | ORAL_TABLET | Freq: Two times a day (BID) | ORAL | Status: DC

## 2015-11-21 ENCOUNTER — Telehealth: Payer: Self-pay | Admitting: Internal Medicine

## 2015-11-21 MED ORDER — HYDROCODONE-ACETAMINOPHEN 10-325 MG PO TABS: 1.0000 | ORAL_TABLET | Freq: Two times a day (BID) | ORAL | Status: DC | PRN

## 2015-11-21 NOTE — Telephone Encounter (Signed)
Rx printed, awaiting MD signature.  

## 2015-11-21 NOTE — Telephone Encounter (Signed)
Caller name: Self   Can be reached: [phone]  Pharmacy:  [id number] - [place], [place] - [address] [phone] (Phone) [phone] (Fax)        Reason for call: Requesting refill on HYDROcodone-acetaminophen (NORCO) 10-325 MG per tablet [[id number]][[id number]]

## 2015-11-21 NOTE — Telephone Encounter (Signed)
Spoke with Pt, informed him that Hydrocodone prescription is ready for pick up at front desk. Pt verbalized understanding.

## 2015-11-21 NOTE — Telephone Encounter (Signed)
Pt is requesting refill on Hydrocodone.  Last OV: 10/31/2015 Last Fill: 08/22/2015 #180 and 0RF Pt can take 1 tablet bid PRN UDS: 04/25/2015 Low risk  Please advise.

## 2015-11-21 NOTE — Telephone Encounter (Signed)
Okay #180 no refills

## 2015-12-09 ENCOUNTER — Other Ambulatory Visit (INDEPENDENT_AMBULATORY_CARE_PROVIDER_SITE_OTHER): Payer: Medicare Other

## 2015-12-09 DIAGNOSIS — E038 Other specified hypothyroidism: Secondary | ICD-10-CM | POA: Diagnosis not present

## 2015-12-09 LAB — TSH: TSH: 7.03 u[IU]/mL — AB (ref 0.35–4.50)

## 2015-12-12 ENCOUNTER — Other Ambulatory Visit: Payer: Self-pay

## 2015-12-12 ENCOUNTER — Ambulatory Visit (HOSPITAL_COMMUNITY): Payer: Medicare Other | Attending: Cardiovascular Disease

## 2015-12-12 DIAGNOSIS — I10 Essential (primary) hypertension: Secondary | ICD-10-CM | POA: Diagnosis not present

## 2015-12-12 DIAGNOSIS — Z6838 Body mass index (BMI) 38.0-38.9, adult: Secondary | ICD-10-CM | POA: Insufficient documentation

## 2015-12-12 DIAGNOSIS — I4891 Unspecified atrial fibrillation: Secondary | ICD-10-CM | POA: Diagnosis not present

## 2015-12-12 DIAGNOSIS — I059 Rheumatic mitral valve disease, unspecified: Secondary | ICD-10-CM | POA: Diagnosis not present

## 2015-12-12 DIAGNOSIS — I517 Cardiomegaly: Secondary | ICD-10-CM | POA: Diagnosis not present

## 2015-12-12 DIAGNOSIS — I071 Rheumatic tricuspid insufficiency: Secondary | ICD-10-CM | POA: Insufficient documentation

## 2015-12-12 MED ORDER — LEVOTHYROXINE SODIUM 50 MCG PO TABS: 50.0000 ug | ORAL_TABLET | Freq: Every day | ORAL | Status: DC

## 2015-12-12 NOTE — Addendum Note (Signed)
Addended by: Conrad BurlingtonANTER, Lexys Milliner D on: 12/12/2015 11:03 AM   Modules accepted: Orders, Medications

## 2015-12-27 ENCOUNTER — Encounter: Payer: Self-pay | Admitting: *Deleted

## 2015-12-28 ENCOUNTER — Telehealth: Payer: Self-pay | Admitting: Internal Medicine

## 2015-12-28 NOTE — Telephone Encounter (Signed)
I called and spoke with the patient. He is aware of his echo results. He will need follow up with Amber later this month, but this has not been scheduled. Will forward a message to [facility] to please call the patient to arrange this.

## 2015-12-28 NOTE — Telephone Encounter (Signed)
Pt states Warren [name] has been trying to reach him and he has been unavailable  until now -pls call [phone]

## 2016-01-10 ENCOUNTER — Encounter: Payer: Self-pay | Admitting: Internal Medicine

## 2016-01-30 ENCOUNTER — Encounter: Payer: Self-pay | Admitting: Nurse Practitioner

## 2016-02-07 NOTE — Progress Notes (Signed)
Electrophysiology Office Note Date: 02/08/2016  ID:  Ansar Skoda, DOB Jul 15, 1946, MRN [id number]  PCP: Willow Ora, MD Electrophysiologist: Graciela Husbands  CC: atrial fibrillation  Warren [name] is a 70 y.o. male seen today for Dr Graciela Husbands.  He has a longstanding history of atrial fibrillation and has been maintained on Flecainide, Metoprolol, and Cardizem with good control.  He also has a history of SVT and is s/p left lateral pathway ablation in 2002.  He developed recurrent atrial fibrillation in 09/2015 and was seen by me at that time.  DCCV was recommended, but he declined. He since saw Dr Graciela Husbands who again recommended DCCV but he did not wish to pursue at that time.  He presents today for follow up.  He has persistent fatigue and insomnia but overall feels well.  He initially reported worsening exercise tolerance, but then stated he thinks his exercise tolerance is stable.  He denies chest pain, palpitations, dyspnea, PND, orthopnea, nausea, vomiting, dizziness, syncope.  Last echo 11/2015 demonstrated EF 55-60%, no RWMA, LA severely dilated.   Past Medical History  Diagnosis Date  . Hypertension   . Supraventricular tachycardia (HCC)     a. s/p concealed left lateral pathway ablation 2002 Dr Graciela Husbands  . Persistent atrial fibrillation (HCC)   . Increased prostate specific antigen (PSA) velocity   . Morbid obesity (HCC)   . Sleep-disordered breathing   . Hypothyroidism    Past Surgical History  Procedure Laterality Date  . Appendectomy    . Cardiac catheterization  2001    NO CAD  . Ablation  2002    concealed left lateral pathway ablation by Dr Graciela Husbands  . Tonsillectomy    . Nm myoview ltd      12-09    Current Outpatient Prescriptions  Medication Sig Dispense Refill  . apixaban (ELIQUIS) 5 MG TABS tablet Take 1 tablet (5 mg total) by mouth 2 (two) times daily. 60 tablet 6  . co-enzyme Q-10 30 MG capsule Take 30 mg by mouth daily.     . COD LIVER OIL PO Take 2 tablets by mouth  daily.     Marland Kitchen diltiazem (CARDIZEM CD) 120 MG 24 hr capsule Take 1 capsule (120 mg total) by mouth daily. 30 capsule 8  . fish oil-omega-3 fatty acids 1000 MG capsule Take 2 g by mouth daily.      . flecainide (TAMBOCOR) 150 MG tablet Take 1 tablet (150 mg total) by mouth 2 (two) times daily. 60 tablet 11  . hydrochlorothiazide (HYDRODIURIL) 25 MG tablet Take 1 tablet (25 mg total) by mouth daily. 30 tablet 6  . HYDROcodone-acetaminophen (NORCO) 10-325 MG tablet Take 1 tablet by mouth 2 (two) times daily as needed. 180 tablet 0  . levothyroxine (SYNTHROID, LEVOTHROID) 50 MCG tablet Take 1 tablet (50 mcg total) by mouth daily before breakfast. 30 tablet 1  . metoprolol (TOPROL-XL) 200 MG 24 hr tablet Take 1 tablet (200 mg total) by mouth daily. 30 tablet 8  . Multiple Vitamins-Minerals (CENTRUM SILVER PO) Take 1 each by mouth daily.      . nitroGLYCERIN (NITROSTAT) 0.4 MG SL tablet Place 1 tablet (0.4 mg total) under the tongue every 5 (five) minutes as needed for chest pain (x 3 doses). 25 tablet 3  . omeprazole (PRILOSEC) 40 MG capsule Take 1 capsule (40 mg total) by mouth daily. 30 capsule 8  . potassium chloride SA (K-DUR,KLOR-CON) 20 MEQ tablet Take 3 tablets (60 mEq total) by mouth daily. 90 tablet  8  . traZODone (DESYREL) 50 MG tablet Take 0.5-1 tablets (25-50 mg total) by mouth at bedtime as needed for sleep. 60 tablet 6  . TURMERIC PO Take 4 capsules by mouth daily.       No current facility-administered medications for this visit.    Allergies:   Review of patient's allergies indicates no known allergies.   Social History: Social History   Social History  . Marital Status: Single    Spouse Name: N/A  . Number of Children: 0  . Years of Education: N/A   Occupational History  . 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 Smoking status: Current Every Day Smoker    Types: Cigars  . Smokeless tobacco: Not on file     Comment: HAS 2 LARGE  CIGARS A DAY  . Alcohol Use: Yes     Comment: RARE  . Drug Use: Not on file  . Sexual Activity: Not on file   Other Topics Concern  . Not on file   Social History Narrative   Lives by himself , has no family, did not provide  any contact name for emergencies                  Family History: Family History  Problem Relation Age of Onset  . Adopted: Yes  . Family history unknown: Yes    Review of Systems: All other systems reviewed and are otherwise negative except as noted above.   Physical Exam: VS:  BP 98/80 mmHg  Pulse 78  Ht  (1.778 m)  Wt 271 lb 12.8 oz (123.288 kg)  BMI 39.00 kg/m2 , BMI Body mass index is 39 kg/(m^2). Wt Readings from Last 3 Encounters:  02/08/16 271 lb 12.8 oz (123.288 kg)  11/15/15 271 lb 12.8 oz (123.288 kg)  10/31/15 273 lb (123.832 kg)    GEN- The patient is obese and disheveled appearing, alert and oriented x 3 today, very strong nicotine odor with nicotine stained clothing HEENT: normocephalic, atraumatic; sclera clear, conjunctiva pink; hearing intact; oropharynx clear; neck supple  Lungs- Clear to ausculation bilaterally, normal work of breathing.  No wheezes, rales, rhonchi Heart- Regular rate and rhythm  GI- obese, non-tender, non-distended, bowel sounds present Extremities- no clubbing, cyanosis, or edema; DP/PT/radial pulses 1+ bilaterally MS- no significant deformity or atrophy Skin- warm and dry, no rash or lesion  Psych- euthymic mood, full affect Neuro- strength and sensation are intact   EKG:  EKG is ordered today. The ekg ordered today shows atrial fibrillation, v rate 78  Recent Labs: 10/31/2015: BUN 23; Creatinine, Ser 1.37; Potassium 4.1; Sodium 142 12/09/2015: TSH 7.03*    Other studies Reviewed: Additional studies/ records that were reviewed today include: Dr Odessa Fleming notes, last echo  Assessment and Plan: 1.  Persistent atrial fibrillation Continue Eliquis for CHADS2VASC of at least 2 Discussed  treatment options again with patient today. He would like to continue to defer cardioversion for now. We discussed fact that with LA enlargement, our ability to maintain SR is likely decreased. We also discussed that AF leads to more AF and if he thought symptoms were related to atrial fibrillation, cardioversion would be the appropriate course of treatment. He wishes to follow up with me in 4 weeks and discuss again at that time. He is not willing to discontinue Flecainide now with the possibility of future rhythm control. If he is not willing to consider DCCV in  4 weeks, will discontinue Flecainide at that time.  Ultimately, lifestyle modification will be his best treatment option. Discussed weight loss and treatment of sleep apnea today Significantly enlarged LA and obesity makes him not an ideal ablation candidate  2.  HTN Stable No change required today  3.  Obesity Weight loss will be required for long term success in managing atrial fibrillation  4.  Sleep disordered breathing Evaluation of sleep apnea discussed with patient previously who declined work up  Again discussed relationship of sleep apnea to atrial arrhythmias Pt is not willing to have sleep study done at this time  Current medicines are reviewed at length with the patient today.   The patient does not have concerns regarding his medicines.  The following changes were made today:  none  Labs/ tests ordered today include: none  Disposition:   Follow up with me in 4 weeks    Signed, Gypsy Balsam, NP 02/08/2016 2:26 PM   [facility] [address] [place] [address] [phone] (office) [phone] (fax)

## 2016-02-08 ENCOUNTER — Ambulatory Visit (INDEPENDENT_AMBULATORY_CARE_PROVIDER_SITE_OTHER): Payer: Medicare Other | Admitting: Nurse Practitioner

## 2016-02-08 ENCOUNTER — Encounter: Payer: Self-pay | Admitting: Nurse Practitioner

## 2016-02-08 VITALS — BP 98/80 | HR 78 | Ht 70.0 in | Wt 271.8 lb

## 2016-02-08 DIAGNOSIS — I10 Essential (primary) hypertension: Secondary | ICD-10-CM

## 2016-02-08 DIAGNOSIS — I4819 Other persistent atrial fibrillation: Secondary | ICD-10-CM

## 2016-02-08 DIAGNOSIS — I481 Persistent atrial fibrillation: Secondary | ICD-10-CM | POA: Diagnosis not present

## 2016-02-08 MED ORDER — HYDROCHLOROTHIAZIDE 25 MG PO TABS: 25.0000 mg | ORAL_TABLET | Freq: Every day | ORAL | Status: DC

## 2016-02-08 MED ORDER — NITROGLYCERIN 0.4 MG SL SUBL: 0.4000 mg | SUBLINGUAL_TABLET | SUBLINGUAL | Status: DC | PRN

## 2016-02-08 NOTE — Patient Instructions (Signed)
Medication Instructions:   Your physician recommends that you continue on your current medications as directed. Please refer to the Current Medication list given to you today.  If you need a refill on your cardiac medications before your next appointment, please call your pharmacy.  Labwork: NONE ORDER TODAY    Testing/Procedures:  NONE ORDER TODAY    Follow-Up:  IN 4 WEEKS OR NEXT AVAILABLE AFTER 4 WEEKS    Any Other Special Instructions Will Be Listed Below (If Applicable).

## 2016-02-09 ENCOUNTER — Telehealth: Payer: Self-pay | Admitting: Internal Medicine

## 2016-02-09 NOTE — Telephone Encounter (Signed)
Due for a TSH, please arrange 

## 2016-02-10 NOTE — Telephone Encounter (Signed)
Letter printed and mailed to Pt informing him he is due for TSH recheck.

## 2016-02-16 ENCOUNTER — Other Ambulatory Visit: Payer: Self-pay

## 2016-02-16 MED ORDER — DILTIAZEM HCL ER COATED BEADS 120 MG PO CP24: 120.0000 mg | ORAL_CAPSULE | Freq: Every day | ORAL | Status: DC

## 2016-02-17 ENCOUNTER — Telehealth: Payer: Self-pay | Admitting: Internal Medicine

## 2016-02-17 MED ORDER — HYDROCODONE-ACETAMINOPHEN 10-325 MG PO TABS: 1.0000 | ORAL_TABLET | Freq: Two times a day (BID) | ORAL | Status: DC | PRN

## 2016-02-17 NOTE — Telephone Encounter (Signed)
Ok 180 TSH!

## 2016-02-17 NOTE — Telephone Encounter (Signed)
Rx printed, awaiting MD signature.  

## 2016-02-17 NOTE — Telephone Encounter (Signed)
Caller name: Self  Can be reached: [phone]   Reason for call: Refill on HYDROcodone-acetaminophen (NORCO) 10-325 MG tablet [[id number]]

## 2016-02-17 NOTE — Telephone Encounter (Signed)
Please inform Pt that Rx has been placed at front desk for pick up at his convenience. Thank you.  

## 2016-02-17 NOTE — Telephone Encounter (Signed)
Pt is requesting refill on Hydrocodone.  Last OV: 10/31/2015 Last Fill: 11/21/2015 #180 and 0RF UDS: 04/25/2015 Low risk  Please advise.   **Will also inform Pt he is overdue for TSH recheck at time of pickup**

## 2016-02-21 ENCOUNTER — Other Ambulatory Visit (INDEPENDENT_AMBULATORY_CARE_PROVIDER_SITE_OTHER): Payer: Medicare Other

## 2016-02-21 DIAGNOSIS — E038 Other specified hypothyroidism: Secondary | ICD-10-CM

## 2016-02-22 LAB — TSH: TSH: 4.38 u[IU]/mL (ref 0.35–4.50)

## 2016-02-24 MED ORDER — LEVOTHYROXINE SODIUM 50 MCG PO TABS: 50.0000 ug | ORAL_TABLET | Freq: Every day | ORAL | Status: DC

## 2016-02-24 NOTE — Addendum Note (Signed)
Addended byConrad Southview: Damien Batty D on: 02/24/2016 01:16 PM   Modules accepted: Orders

## 2016-02-29 ENCOUNTER — Ambulatory Visit: Payer: Medicare Other | Admitting: Internal Medicine

## 2016-03-06 ENCOUNTER — Other Ambulatory Visit: Payer: Self-pay

## 2016-03-06 MED ORDER — POTASSIUM CHLORIDE CRYS ER 20 MEQ PO TBCR
60.0000 meq | EXTENDED_RELEASE_TABLET | Freq: Every day | ORAL | Status: DC
Start: 2016-03-06 — End: 2016-07-11

## 2016-03-06 NOTE — Progress Notes (Signed)
Electrophysiology Office Note Date: 03/07/2016  ID:  Warren [name], DOB 12-19-1946, MRN [id number]  PCP: Warren OraJose Paz, MD Electrophysiologist: Graciela HusbandsKlein  CC: atrial fibrillation  Warren [name] is a 70 y.o. male seen today for Dr Graciela HusbandsKlein.  He has a longstanding history of atrial fibrillation and has been maintained on Flecainide, Metoprolol, and Cardizem with good control.  He also has a history of SVT and is s/p left lateral pathway ablation in 2002.  He developed recurrent atrial fibrillation in 09/2015 and was seen by me at that time.  DCCV was recommended, but he declined. He since saw Dr Graciela HusbandsKlein who again recommended DCCV but he did not wish to pursue at that time.  He presents today for follow up.  He has persistent fatigue and insomnia but overall feels well.  He remains reluctant to pursue DCCV.   He denies chest pain, palpitations, dyspnea, PND, orthopnea, nausea, vomiting, dizziness, syncope.  Last echo 11/2015 demonstrated EF 55-60%, no RWMA, LA severely dilated.   Past Medical History  Diagnosis Date  . Hypertension   . Supraventricular tachycardia (HCC)     a. s/p concealed left lateral pathway ablation 2002 Dr Graciela HusbandsKlein  . Persistent atrial fibrillation (HCC)   . Increased prostate specific antigen (PSA) velocity   . Morbid obesity (HCC)   . Sleep-disordered breathing   . Hypothyroidism    Past Surgical History  Procedure Laterality Date  . Appendectomy    . Cardiac catheterization  2001    NO CAD  . Ablation  2002    concealed left lateral pathway ablation by Dr Graciela HusbandsKlein  . Tonsillectomy    . Nm myoview ltd      12-09    Current Outpatient Prescriptions  Medication Sig Dispense Refill  . apixaban (ELIQUIS) 5 MG TABS tablet Take 1 tablet (5 mg total) by mouth 2 (two) times daily. 60 tablet 6  . co-enzyme Q-10 30 MG capsule Take 30 mg by mouth daily.     . COD LIVER OIL PO Take 2 tablets by mouth daily.     Marland Kitchen. diltiazem (CARDIZEM CD) 120 MG 24 hr capsule Take 1 capsule (120  mg total) by mouth daily. 30 capsule 3  . fish oil-omega-3 fatty acids 1000 MG capsule Take 2 g by mouth daily.      . hydrochlorothiazide (HYDRODIURIL) 25 MG tablet Take 1 tablet (25 mg total) by mouth daily. 30 tablet 6  . HYDROcodone-acetaminophen (NORCO) 10-325 MG tablet Take 1 tablet by mouth 2 (two) times daily as needed. 180 tablet 0  . levothyroxine (SYNTHROID, LEVOTHROID) 50 MCG tablet Take 1 tablet (50 mcg total) by mouth daily before breakfast. 30 tablet 1  . metoprolol (TOPROL-XL) 200 MG 24 hr tablet Take 1 tablet (200 mg total) by mouth daily. 30 tablet 8  . Multiple Vitamins-Minerals (CENTRUM SILVER PO) Take 1 each by mouth daily.      . nitroGLYCERIN (NITROSTAT) 0.4 MG SL tablet Place 1 tablet (0.4 mg total) under the tongue every 5 (five) minutes as needed for chest pain (x 3 doses). 25 tablet 3  . omeprazole (PRILOSEC) 40 MG capsule Take 1 capsule (40 mg total) by mouth daily. 30 capsule 8  . potassium chloride SA (K-DUR,KLOR-CON) 20 MEQ tablet Take 3 tablets (60 mEq total) by mouth daily. 90 tablet 3  . traZODone (DESYREL) 50 MG tablet Take 0.5-1 tablets (25-50 mg total) by mouth at bedtime as needed for sleep. 60 tablet 6  . TURMERIC PO Take 4  capsules by mouth daily.       No current facility-administered medications for this visit.    Allergies:   Review of patient's allergies indicates no known allergies.   Social History: Social History   Social History  . Marital Status: Single    Spouse Name: N/A  . Number of Children: 0  . Years of Education: N/A   Occupational History  . 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 Smoking status: Current Every Day Smoker    Types: Cigars  . Smokeless tobacco: Not on file     Comment: HAS 2 LARGE CIGARS A DAY  . Alcohol Use: Yes     Comment: RARE  . Drug Use: Not on file  . Sexual Activity: Not on file   Other Topics Concern  . Not on file   Social History Narrative   Lives  by himself , has no family, did not provide  any contact name for emergencies                  Family History: Family History  Problem Relation Age of Onset  . Adopted: Yes  . Family history unknown: Yes    Review of Systems: All other systems reviewed and are otherwise negative except as noted above.   Physical Exam: VS:  BP 122/62 mmHg  Pulse 87  Ht  (1.778 m)  Wt 267 lb (121.11 kg)  BMI 38.31 kg/m2 , BMI Body mass index is 38.31 kg/(m^2). Wt Readings from Last 3 Encounters:  03/07/16 267 lb (121.11 kg)  02/08/16 271 lb 12.8 oz (123.288 kg)  11/15/15 271 lb 12.8 oz (123.288 kg)    GEN- The patient is obese and disheveled appearing, alert and oriented x 3 today, very strong nicotine odor with nicotine stained clothing HEENT: normocephalic, atraumatic; sclera clear, conjunctiva pink; hearing intact; oropharynx clear; neck supple  Lungs- Clear to ausculation bilaterally, normal work of breathing.  No wheezes, rales, rhonchi Heart- Regular rate and rhythm  GI- obese, non-tender, non-distended, bowel sounds present Extremities- no clubbing, cyanosis, or edema; DP/PT/radial pulses 1+ bilaterally MS- no significant deformity or atrophy Skin- warm and dry, no rash or lesion  Psych- euthymic mood, full affect Neuro- strength and sensation are intact   EKG:  EKG is ordered today. The ekg ordered today shows atrial flutter  Recent Labs: 10/31/2015: BUN 23; Creatinine, Ser 1.37; Potassium 4.1; Sodium 142 02/21/2016: TSH 4.38    Other studies Reviewed: Additional studies/ records that were reviewed today include: Dr Odessa Fleming notes, last echo  Assessment and Plan: 1.  Persistent atrial fibrillation Continue Eliquis for CHADS2VASC of at least 2 Discussed treatment options again with patient today. He would like to continue to defer cardioversion for now. Discontinue Flecainide.  Ultimately, lifestyle modification will be his best treatment option. Discussed weight loss  and treatment of sleep apnea today Significantly enlarged LA and obesity makes him not an ideal ablation candidate  2.  HTN Stable No change required today  3.  Obesity Weight loss will be required for long term success in managing atrial fibrillation  4.  Sleep disordered breathing Evaluation of sleep apnea discussed with patient previously who declined work up  Again discussed relationship of sleep apnea to atrial arrhythmias Pt is not willing to have sleep study done at this time  Current medicines are reviewed at length with the patient today.   The patient does not have concerns  regarding his medicines.  The following changes were made today:  none  Labs/ tests ordered today include: none  Disposition:   Follow up with Dr Graciela Husbands in 3 months    Signed, Gypsy Balsam, NP 03/07/2016 2:34 PM   [facility] [address] [place] [address] [phone] (office) [phone] (fax)

## 2016-03-07 ENCOUNTER — Ambulatory Visit (INDEPENDENT_AMBULATORY_CARE_PROVIDER_SITE_OTHER): Payer: Medicare Other | Admitting: Nurse Practitioner

## 2016-03-07 ENCOUNTER — Encounter: Payer: Self-pay | Admitting: Nurse Practitioner

## 2016-03-07 VITALS — BP 122/62 | HR 87 | Ht 70.0 in | Wt 267.0 lb

## 2016-03-07 DIAGNOSIS — I482 Chronic atrial fibrillation: Secondary | ICD-10-CM

## 2016-03-07 DIAGNOSIS — I10 Essential (primary) hypertension: Secondary | ICD-10-CM

## 2016-03-07 DIAGNOSIS — I4821 Permanent atrial fibrillation: Secondary | ICD-10-CM

## 2016-03-07 DIAGNOSIS — I4891 Unspecified atrial fibrillation: Secondary | ICD-10-CM

## 2016-03-07 NOTE — Patient Instructions (Signed)
Medication Instructions:   STOP TAKING  FLECAINIDE    If you need a refill on your cardiac medications before your next appointment, please call your pharmacy.  Labwork: NONE ORDER TODAY    Testing/Procedures: NONE ORDER TODAY   Follow-Up:  3 MONTHS WITH DR Graciela HusbandsKLEIN    Any Other Special Instructions Will Be Listed Below (If Applicable).

## 2016-03-12 ENCOUNTER — Other Ambulatory Visit: Payer: Self-pay

## 2016-03-13 ENCOUNTER — Encounter: Payer: Self-pay | Admitting: Internal Medicine

## 2016-03-13 ENCOUNTER — Ambulatory Visit (INDEPENDENT_AMBULATORY_CARE_PROVIDER_SITE_OTHER): Payer: Medicare Other | Admitting: Internal Medicine

## 2016-03-13 VITALS — BP 122/62 | HR 68 | Temp 97.5°F | Ht 70.0 in | Wt 269.4 lb

## 2016-03-13 DIAGNOSIS — I10 Essential (primary) hypertension: Secondary | ICD-10-CM | POA: Diagnosis not present

## 2016-03-13 DIAGNOSIS — G4700 Insomnia, unspecified: Secondary | ICD-10-CM | POA: Diagnosis not present

## 2016-03-13 DIAGNOSIS — Z09 Encounter for follow-up examination after completed treatment for conditions other than malignant neoplasm: Secondary | ICD-10-CM

## 2016-03-13 DIAGNOSIS — E039 Hypothyroidism, unspecified: Secondary | ICD-10-CM | POA: Diagnosis not present

## 2016-03-13 NOTE — Patient Instructions (Signed)
  GO TO THE FRONT DESK Schedule labs to be done in 2-3 weeks (TSH, BMP), no need to fast  Schedule your next appointment for a  Physical exam When?   4 months  Fasting?  Yes

## 2016-03-13 NOTE — Progress Notes (Signed)
Pre visit review using our clinic review tool, if applicable. No additional management support is needed unless otherwise documented below in the visit note. 

## 2016-03-13 NOTE — Progress Notes (Signed)
Subjective:    Patient ID: Warren [name], male    DOB: 1946/05/05, 70 y.o.   MRN: [id number]  DOS:  03/13/2016 Type of visit - description : Routine office visit Interval history:  In general feeling well, good compliance w/ medication. Reports that one tablet of Desyrel is not helping enough and with 2 tablets has a headache.  Review of Systems Denies chest pain, difficulty breathing or edema No blood in the stools or hematuria  Past Medical History  Diagnosis Date  . Hypertension   . Supraventricular tachycardia (HCC)     a. s/p concealed left lateral pathway ablation 2002 Dr Graciela HusbandsKlein  . Persistent atrial fibrillation (HCC)   . Increased prostate specific antigen (PSA) velocity   . Morbid obesity (HCC)   . Sleep-disordered breathing   . Hypothyroidism     Past Surgical History  Procedure Laterality Date  . Appendectomy    . Cardiac catheterization  2001    NO CAD  . Ablation  2002    concealed left lateral pathway ablation by Dr Graciela HusbandsKlein  . Tonsillectomy    . Nm myoview ltd      12-09    Social History   Social History  . Marital Status: Single    Spouse Name: N/A  . Number of Children: 0  . Years of Education: N/A   Occupational History  . not working, [employer], [place] [employer] [place]    Social History Main Topics  . Smoking status: Current Every Day Smoker    Types: Cigars  . Smokeless tobacco: Not on file     Comment: HAS 2 LARGE CIGARS A DAY  . Alcohol Use: Yes     Comment: RARE  . Drug Use: Not on file  . Sexual Activity: Not on file   Other Topics Concern  . Not on file   Social History Narrative   Lives by himself , has no family, did not provide  any contact name for emergencies                      Medication List       This list is accurate as of: 03/13/16  7:37 PM.  Always use your most recent med list.               apixaban 5 MG Tabs tablet  Commonly known as:  ELIQUIS  Take 1 tablet (5 mg total) by mouth 2 (two) times  daily.     CENTRUM SILVER PO  Take 1 each by mouth daily.     co-enzyme Q-10 30 MG capsule  Take 30 mg by mouth daily.     COD LIVER OIL PO  Take 2 tablets by mouth daily.     diltiazem 120 MG 24 hr capsule  Commonly known as:  CARDIZEM CD  Take 1 capsule (120 mg total) by mouth daily.     fish oil-omega-3 fatty acids 1000 MG capsule  Take 2 g by mouth daily.     hydrochlorothiazide 25 MG tablet  Commonly known as:  HYDRODIURIL  Take 1 tablet (25 mg total) by mouth daily.     HYDROcodone-acetaminophen 10-325 MG tablet  Commonly known as:  NORCO  Take 1 tablet by mouth 2 (two) times daily as needed.     levothyroxine 50 MCG tablet  Commonly known as:  SYNTHROID, LEVOTHROID  Take 1 tablet (50 mcg total) by mouth daily before breakfast.     metoprolol  200 MG 24 hr tablet  Commonly known as:  TOPROL-XL  Take 1 tablet (200 mg total) by mouth daily.     nitroGLYCERIN 0.4 MG SL tablet  Commonly known as:  NITROSTAT  Place 1 tablet (0.4 mg total) under the tongue every 5 (five) minutes as needed for chest pain (x 3 doses).     omeprazole 40 MG capsule  Commonly known as:  PRILOSEC  Take 1 capsule (40 mg total) by mouth daily.     potassium chloride SA 20 MEQ tablet  Commonly known as:  K-DUR,KLOR-CON  Take 3 tablets (60 mEq total) by mouth daily.     traZODone 50 MG tablet  Commonly known as:  DESYREL  Take 0.5-1 tablets (25-50 mg total) by mouth at bedtime as needed for sleep.     TURMERIC PO  Take 4 capsules by mouth daily.           Objective:   Physical Exam BP 122/62 mmHg  Pulse 68  Temp(Src) 97.5 F (36.4 C) (Oral)  Ht  (1.778 m)  Wt 269 lb 6 oz (122.188 kg)  BMI 38.65 kg/m2  SpO2 93% General:   Well developed, well nourished . NAD.  HEENT:  Normocephalic . Face symmetric, atraumatic Lungs:  CTA B Normal respiratory effort, no intercostal retractions, no accessory muscle use. Heart: Irregularly irregular,  no murmur.  No pretibial edema  bilaterally  Skin: Not pale. Not jaundice Neurologic:  alert & oriented X3.  Speech normal, gait appropriate for age and unassisted Psych--  Cognition and judgment appear intact.  Cooperative with normal attention span and concentration.  Behavior appropriate. No anxious or depressed appearing.      Assessment & Plan:   Assessment > HTN Hypothyroidism SVT: Cardiac cath 2001: No CAD, ablation 2002 Persistent atrial fibrillation -- on Eliquis started 08-2015 Insomnia -- Xanax was dc 2015 d/t requiring increasing doses, was changed to Ativan: didn't work. Was switch to Ambien: He had drowsiness the following day.  H/o  Fatigue Sleep-disordered suspected OSA: declined to have a sleep study Increased PSA velocity: Consistently declined to see urology Obesity Back pain-- hydrocodone prn, rx by pcp, low UDS 04-2015   PLAN: HTN: Seems well-controlled, check a BMP in 2 or 3 weeks Hypothyroidism:  Due for a TSH in 2 or 3 weeks Atrial fibrillation: Rate controlled, on Eliquis without apparent complications Insomnia: Still having issues with Desyrel: 1 tab is not helping, 2 is causing the headache. Plaster try 1.5 tablets. RTC one month CPX TSH and BMP in 2 or 3 weeks.

## 2016-03-13 NOTE — Assessment & Plan Note (Signed)
HTN: Seems well-controlled, check a BMP in 2 or 3 weeks Hypothyroidism:  Due for a TSH in 2 or 3 weeks Atrial fibrillation: Rate controlled, on Eliquis without apparent complications Insomnia: Still having issues with Desyrel: 1 tab is not helping, 2 is causing the headache. Plaster try 1.5 tablets. RTC one month CPX TSH and BMP in 2 or 3 weeks.

## 2016-04-03 ENCOUNTER — Other Ambulatory Visit (INDEPENDENT_AMBULATORY_CARE_PROVIDER_SITE_OTHER): Payer: Medicare Other

## 2016-04-03 DIAGNOSIS — I10 Essential (primary) hypertension: Secondary | ICD-10-CM

## 2016-04-03 DIAGNOSIS — E039 Hypothyroidism, unspecified: Secondary | ICD-10-CM | POA: Diagnosis not present

## 2016-04-04 LAB — BASIC METABOLIC PANEL
BUN: 24 mg/dL — ABNORMAL HIGH (ref 6–23)
CHLORIDE: 103 meq/L (ref 96–112)
CO2: 30 meq/L (ref 19–32)
Calcium: 9.5 mg/dL (ref 8.4–10.5)
Creatinine, Ser: 1.39 mg/dL (ref 0.40–1.50)
GFR: 53.53 mL/min — ABNORMAL LOW (ref >60.00)
Glucose, Bld: 95 mg/dL (ref 70–99)
Potassium: 4 mEq/L (ref 3.5–5.1)
SODIUM: 142 meq/L (ref 135–145)

## 2016-04-04 LAB — TSH: TSH: 3.55 u[IU]/mL (ref 0.35–4.50)

## 2016-04-18 ENCOUNTER — Other Ambulatory Visit: Payer: Self-pay | Admitting: *Deleted

## 2016-04-18 MED ORDER — METOPROLOL SUCCINATE ER 200 MG PO TB24: 200.0000 mg | ORAL_TABLET | Freq: Every day | ORAL | Status: DC

## 2016-04-18 MED ORDER — TRAZODONE HCL 50 MG PO TABS: 25.0000 mg | ORAL_TABLET | Freq: Every evening | ORAL | Status: DC | PRN

## 2016-04-18 MED ORDER — OMEPRAZOLE 40 MG PO CPDR: 40.0000 mg | DELAYED_RELEASE_CAPSULE | Freq: Every day | ORAL | Status: DC

## 2016-04-18 NOTE — Telephone Encounter (Signed)
Refill sent per [facility] refill protocol/SLS  

## 2016-04-18 NOTE — Addendum Note (Signed)
Addended by: Regis BillSCATES, SHARON L on: 04/18/2016 11:53 AM   Modules accepted: Orders

## 2016-04-23 ENCOUNTER — Encounter: Payer: Medicare Other | Admitting: Internal Medicine

## 2016-04-30 ENCOUNTER — Other Ambulatory Visit: Payer: Self-pay

## 2016-04-30 MED ORDER — LEVOTHYROXINE SODIUM 50 MCG PO TABS: 50.0000 ug | ORAL_TABLET | Freq: Every day | ORAL | Status: DC

## 2016-05-14 ENCOUNTER — Other Ambulatory Visit: Payer: Self-pay | Admitting: Nurse Practitioner

## 2016-05-16 ENCOUNTER — Telehealth: Payer: Self-pay | Admitting: Internal Medicine

## 2016-05-16 MED ORDER — HYDROCODONE-ACETAMINOPHEN 10-325 MG PO TABS: 1.0000 | ORAL_TABLET | Freq: Two times a day (BID) | ORAL | Status: DC | PRN

## 2016-05-16 NOTE — Telephone Encounter (Signed)
Rx printed, awaiting MD signature.  

## 2016-05-16 NOTE — Telephone Encounter (Signed)
Please inform Pt that Rx is ready for pick up at the front desk at his convenience. Thank you.  

## 2016-05-16 NOTE — Telephone Encounter (Signed)
Called and informed pt. He will be in for pick up.

## 2016-05-16 NOTE — Telephone Encounter (Signed)
Refill request for HYDROcodone.   CB: [phone]

## 2016-05-16 NOTE — Telephone Encounter (Signed)
Okay #180 no refills 

## 2016-05-16 NOTE — Telephone Encounter (Signed)
Pt is requesting refill on Hydrocodone.  Last OV: 03/13/2016 Last Fill: 02/17/2016 #180 and 0RF Pt sig: 1 tablet BID PRN UDS: 04/25/2015 Low risk  Please advise.

## 2016-06-07 ENCOUNTER — Encounter: Payer: Self-pay | Admitting: Internal Medicine

## 2016-06-07 ENCOUNTER — Ambulatory Visit (INDEPENDENT_AMBULATORY_CARE_PROVIDER_SITE_OTHER): Payer: Medicare Other | Admitting: Internal Medicine

## 2016-06-07 VITALS — BP 102/84 | HR 66 | Ht 70.0 in | Wt 267.2 lb

## 2016-06-07 DIAGNOSIS — I4819 Other persistent atrial fibrillation: Secondary | ICD-10-CM

## 2016-06-07 DIAGNOSIS — I481 Persistent atrial fibrillation: Secondary | ICD-10-CM

## 2016-06-07 MED ORDER — HYDROCHLOROTHIAZIDE 25 MG PO TABS: 25.0000 mg | ORAL_TABLET | Freq: Every day | ORAL | Status: DC | PRN

## 2016-06-07 NOTE — Progress Notes (Signed)
[name] who milligrams just on the strength is his is some shearing you have any MRN MRN 4 patient in great family [name], [id number] (fissure over the right middle ear #77 was fully answers no to that steady so if this goes up more than she's G7 her 18th of November 2013 at 12:30 or sustained over 120 sotalol been following him for his back  HPI  Warren [name] is a 70 y.o. male Seen in followup for atrial fibrillation in the context of hypertension. He takes flecainide Cardizem and aspirin was discontinued with the initiation of apixoban.  The patient was found to be in atrial fibrillation and saw AS NP. Echocardiogram  12/16 demonstrated normal LV function and discordant data on the left atrium (42/1.9/47)  He has repeatedly declined cardioversion, and A Seiler NP intercurrently discontinued his flecainide area   he has been diagnosed with hypothyroidism and Synthroid has been started. His his CHADS-  2 score is one and his CHADS-VASc score is 2.  He has no complaints of exercise intolerance edema or fatigue.  Sleep issues are being addressed.     Past Medical History  Diagnosis Date  . Hypertension   . Supraventricular tachycardia (HCC)     a. s/p concealed left lateral pathway ablation 2002 Dr Graciela Husbands  . Persistent atrial fibrillation (HCC)   . Increased prostate specific antigen (PSA) velocity   . Morbid obesity (HCC)   . Sleep-disordered breathing   . Hypothyroidism     Past Surgical History  Procedure Laterality Date  . Appendectomy    . Cardiac catheterization  2001    NO CAD  . Ablation  2002    concealed left lateral pathway ablation by Dr Graciela Husbands  . Tonsillectomy    . Nm myoview ltd      12-09    Current Outpatient Prescriptions  Medication Sig Dispense Refill  . co-enzyme Q-10 30 MG capsule Take 30 mg by mouth daily.     . COD LIVER OIL PO Take 2 tablets by mouth daily.     Marland Kitchen diltiazem (CARDIZEM CD) 120 MG 24 hr capsule Take 1 capsule (120 mg total) by mouth  daily. 30 capsule 3  . ELIQUIS 5 MG TABS tablet take 1 tablet by mouth twice a day 60 tablet 9  . fish oil-omega-3 fatty acids 1000 MG capsule Take 2 g by mouth daily.      . hydrochlorothiazide (HYDRODIURIL) 25 MG tablet Take 1 tablet (25 mg total) by mouth daily as needed. 30 tablet 6  . HYDROcodone-acetaminophen (NORCO) 10-325 MG tablet Take 1 tablet by mouth 2 (two) times daily as needed for moderate pain.    Marland Kitchen levothyroxine (SYNTHROID, LEVOTHROID) 50 MCG tablet Take 1 tablet (50 mcg total) by mouth daily before breakfast. 30 tablet 5  . metoprolol (TOPROL-XL) 200 MG 24 hr tablet Take 1 tablet (200 mg total) by mouth daily. 30 tablet 5  . Multiple Vitamins-Minerals (CENTRUM SILVER PO) Take 1 each by mouth daily.      . nitroGLYCERIN (NITROSTAT) 0.4 MG SL tablet Place 1 tablet (0.4 mg total) under the tongue every 5 (five) minutes as needed for chest pain (x 3 doses). 25 tablet 3  . omeprazole (PRILOSEC) 40 MG capsule Take 1 capsule (40 mg total) by mouth daily. 30 capsule 5  . potassium chloride SA (K-DUR,KLOR-CON) 20 MEQ tablet Take 3 tablets (60 mEq total) by mouth daily. 90 tablet 3  . traZODone (DESYREL) 50 MG tablet Take 0.5-1 tablets (25-50 mg total)  by mouth at bedtime as needed for sleep. 60 tablet 5  . TURMERIC PO Take 4 capsules by mouth 2 (two) times daily.      No current facility-administered medications for this visit.    No Known Allergies  Review of Systems negative except from HPI and PMH  Physical Exam BP 102/84 mmHg  Pulse 66  Ht 5\' 10"  (1.778 m)  Wt 267 lb 3.2 oz (121.201 kg)  BMI 38.34 kg/m2 Well developed and well nourished in no acute distress HENT normal E scleral and icterus clear Neck Supple JVP flat; carotids brisk and full Clear to ausculation  Regular rate and rhythm, no murmurs gallops or rub Soft with active bowel sounds No clubbing cyanosis nonea Edema Alert and oriented, grossly normal motor and sensory function Skin Warm and Dry  ECG  demonstrates sinus rhythm at 42 Intervals 22/10/53 with a QTC of 44 Axis LIX   Assessment and  Plan  Permanent  atrial fibrillation  Hypertension  Sleep-disordered breathing  Morbidly obese    Encouraged To lose weight. He is tolerating his anticoagulant. He is not interested in cardioversion\  Discussed with

## 2016-06-07 NOTE — Patient Instructions (Signed)
Medication Instructions:  Your physician has recommended you make the following change in your medication:  1) CHANGE Hydrochlorothiazide to take daily as needed.  Labwork: None ordered  Testing/Procedures: None ordered  Follow-Up: Your physician wants you to follow-up in: 1 year with Dr. Graciela HusbandsKlein. You will receive a reminder letter in the mail two months in advance. If you don't receive a letter, please call our office to schedule the follow-up appointment.  If you need a refill on your cardiac medications before your next appointment, please call your pharmacy.  Thank you for choosing [facility]!!   Dory HornSherri Price, RN [phone]

## 2016-06-11 ENCOUNTER — Other Ambulatory Visit: Payer: Self-pay

## 2016-06-11 MED ORDER — DILTIAZEM HCL ER COATED BEADS 120 MG PO CP24: 120.0000 mg | ORAL_CAPSULE | Freq: Every day | ORAL | Status: DC

## 2016-07-05 ENCOUNTER — Telehealth: Payer: Self-pay | Admitting: Internal Medicine

## 2016-07-05 NOTE — Telephone Encounter (Signed)
UDS negative for hydrocodone, + amphetamine  Spoke with the patient, he is taking a OTC supplement "rejuvenating formula". He also states that he takes hydrocodone twice a day but sometimes more and consequently he runs out off the medication some days.. Denies taking any amphetamines that he knows of. Plan:  -Avoid any supplements -Avoid running out of hydrocodone (is possible to test is negative because he was done there days he ran out of Vicodin). -Repeat UDS with next narcotic prescription

## 2016-07-11 ENCOUNTER — Other Ambulatory Visit: Payer: Self-pay

## 2016-07-11 MED ORDER — POTASSIUM CHLORIDE CRYS ER 20 MEQ PO TBCR: 60.0000 meq | EXTENDED_RELEASE_TABLET | Freq: Every day | ORAL | Status: DC

## 2016-07-24 ENCOUNTER — Telehealth: Payer: Self-pay

## 2016-07-24 ENCOUNTER — Encounter: Payer: Self-pay | Admitting: Internal Medicine

## 2016-07-24 ENCOUNTER — Ambulatory Visit (INDEPENDENT_AMBULATORY_CARE_PROVIDER_SITE_OTHER): Payer: Medicare Other | Admitting: Internal Medicine

## 2016-07-24 VITALS — BP 126/78 | HR 76 | Temp 97.6°F | Resp 14 | Ht 70.0 in | Wt 273.4 lb

## 2016-07-24 DIAGNOSIS — I4891 Unspecified atrial fibrillation: Secondary | ICD-10-CM | POA: Diagnosis not present

## 2016-07-24 DIAGNOSIS — I10 Essential (primary) hypertension: Secondary | ICD-10-CM | POA: Diagnosis not present

## 2016-07-24 DIAGNOSIS — M545 Low back pain, unspecified: Secondary | ICD-10-CM

## 2016-07-24 DIAGNOSIS — R21 Rash and other nonspecific skin eruption: Secondary | ICD-10-CM

## 2016-07-24 DIAGNOSIS — G4700 Insomnia, unspecified: Secondary | ICD-10-CM | POA: Diagnosis not present

## 2016-07-24 DIAGNOSIS — Z0000 Encounter for general adult medical examination without abnormal findings: Secondary | ICD-10-CM | POA: Diagnosis not present

## 2016-07-24 LAB — CBC WITH DIFFERENTIAL/PLATELET
Basophils Absolute: 0 K/uL (ref 0.0–0.1)
Basophils Relative: 0.3 % (ref 0.0–3.0)
Eosinophils Absolute: 0.2 K/uL (ref 0.0–0.7)
Eosinophils Relative: 2.2 % (ref 0.0–5.0)
HCT: 47.1 % (ref 39.0–52.0)
Hemoglobin: 15.7 g/dL (ref 13.0–17.0)
Lymphocytes Relative: 26.6 % (ref 12.0–46.0)
Lymphs Abs: 2.8 K/uL (ref 0.7–4.0)
MCHC: 33.4 g/dL (ref 30.0–36.0)
MCV: 89.1 fl (ref 78.0–100.0)
Monocytes Absolute: 0.9 K/uL (ref 0.1–1.0)
Monocytes Relative: 8.3 % (ref 3.0–12.0)
Neutro Abs: 6.5 K/uL (ref 1.4–7.7)
Neutrophils Relative %: 62.6 % (ref 43.0–77.0)
Platelets: 207 K/uL (ref 150.0–400.0)
RBC: 5.29 Mil/uL (ref 4.22–5.81)
RDW: 14.1 % (ref 11.5–15.5)
WBC: 10.3 K/uL (ref 4.0–10.5)

## 2016-07-24 LAB — LIPID PANEL
Cholesterol: 171 mg/dL (ref 0–200)
HDL: 38.9 mg/dL — ABNORMAL LOW
LDL Cholesterol: 110 mg/dL — ABNORMAL HIGH (ref 0–99)
NonHDL: 131.8
Total CHOL/HDL Ratio: 4
Triglycerides: 109 mg/dL (ref 0.0–149.0)
VLDL: 21.8 mg/dL (ref 0.0–40.0)

## 2016-07-24 MED ORDER — HYDROCODONE-ACETAMINOPHEN 10-325 MG PO TABS: 1.0000 | ORAL_TABLET | Freq: Two times a day (BID) | ORAL | 0 refills | Status: DC | PRN

## 2016-07-24 MED ORDER — TRIAMCINOLONE ACETONIDE 0.1 % EX LOTN: 1.0000 "application " | TOPICAL_LOTION | Freq: Two times a day (BID) | CUTANEOUS | 0 refills | Status: DC

## 2016-07-24 MED ORDER — TRAZODONE HCL 50 MG PO TABS: 50.0000 mg | ORAL_TABLET | Freq: Every evening | ORAL | 5 refills | Status: DC | PRN

## 2016-07-24 NOTE — Progress Notes (Signed)
Subjective:    Patient ID: Warren [name], male    DOB: 23-Oct-1946, 70 y.o.   MRN: [id number]  DOS:  07/24/2016 Type of visit - description : CPX Interval history: Multiple other issues were discussed today in addition to a CPX Good medication compliance   Review of Systems Constitutional: No fever. No chills. No unexplained wt changes. No unusual sweats  HEENT: No dental problems, no ear discharge, no facial swelling, no voice changes. No eye discharge, no eye  redness , no  intolerance to light   Respiratory: No wheezing , no  difficulty breathing. No cough , no mucus production  Cardiovascular: No CP, no leg swelling , no  Palpitations  GI: no nausea, no vomiting, no diarrhea , no  abdominal pain.  No blood in the stools. No dysphagia, no odynophagia    Endocrine: No polyphagia, no polyuria , no polydipsia  GU: No dysuria, gross hematuria, difficulty urinating. No urinary urgency, no frequency.  Musculoskeletal: No joint swellings or unusual aches or pains  Skin: No change in the color of the skin, palor . One-month history of on and off rash on the extremities, start as a pink rash, very achy, blisters sometimes?Marland Kitchen No fever, chills, headaches or fatigue  Allergic, immunologic: No environmental allergies , no  food allergies  Neurological: No dizziness no  syncope. No headaches. No diplopia, no slurred, no slurred speech, no motor deficits, no facial  Numbness  Hematological: No enlarged lymph nodes, no easy bruising , no unusual bleedings  Psychiatry: No suicidal ideas, no hallucinations, no beavior problems, no confusion.  No unusual/severe anxiety, no depression   Past Medical History:  Diagnosis Date  . Hypertension   . Hypothyroidism   . Increased prostate specific antigen (PSA) velocity   . Morbid obesity (HCC)   . Persistent atrial fibrillation (HCC)   . Sleep-disordered breathing   . Supraventricular tachycardia (HCC)    a. s/p concealed left lateral  pathway ablation 2002 Dr Graciela Husbands    Past Surgical History:  Procedure Laterality Date  . ABLATION  2002   concealed left lateral pathway ablation by Dr Graciela Husbands  . APPENDECTOMY    . CARDIAC CATHETERIZATION  2001   NO CAD  . NM MYOVIEW LTD     12-09  . TONSILLECTOMY      Social History   Social History  . Marital status: Single    Spouse name: N/A  . Number of children: 0  . Years of education: N/A   Occupational History  . 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 Smoking status: Current Every Day Smoker    Types: Cigars  . Smokeless tobacco: Never Used     Comment: HAS 2 LARGE CIGARS A DAY  . Alcohol use 0.0 oz/week     Comment: RARE  . Drug use: Unknown  . Sexual activity: Not on file   Other Topics Concern  . Not on file   Social History Narrative   Lives by himself , has no family,  for emergency contact he provided today the number of a  neighbor [phone]                   Family History  Problem Relation Age of Onset  . Adopted: Yes  . Family history unknown: Yes       Medication List       Accurate as of 07/24/16  5:26 PM. Always  use your most recent med list.          CENTRUM SILVER PO Take 1 each by mouth daily.   co-enzyme Q-10 30 MG capsule Take 30 mg by mouth daily.   COD LIVER OIL PO Take 2 tablets by mouth daily.   diltiazem 120 MG 24 hr capsule Commonly known as:  CARDIZEM CD Take 1 capsule (120 mg total) by mouth daily.   ELIQUIS 5 MG Tabs tablet Generic drug:  apixaban take 1 tablet by mouth twice a day   fish oil-omega-3 fatty acids 1000 MG capsule Take 2 g by mouth daily.   hydrochlorothiazide 25 MG tablet Commonly known as:  HYDRODIURIL Take 1 tablet (25 mg total) by mouth daily as needed.   HYDROcodone-acetaminophen 10-325 MG tablet Commonly known as:  NORCO Take 1 tablet by mouth 2 (two) times daily as needed for moderate pain.   levothyroxine 50 MCG  tablet Commonly known as:  SYNTHROID, LEVOTHROID Take 1 tablet (50 mcg total) by mouth daily before breakfast.   metoprolol 200 MG 24 hr tablet Commonly known as:  TOPROL-XL Take 1 tablet (200 mg total) by mouth daily.   nitroGLYCERIN 0.4 MG SL tablet Commonly known as:  NITROSTAT Place 1 tablet (0.4 mg total) under the tongue every 5 (five) minutes as needed for chest pain (x 3 doses).   omeprazole 40 MG capsule Commonly known as:  PRILOSEC Take 1 capsule (40 mg total) by mouth daily.   potassium chloride SA 20 MEQ tablet Commonly known as:  K-DUR,KLOR-CON Take 3 tablets (60 mEq total) by mouth daily.   traZODone 50 MG tablet Commonly known as:  DESYREL Take 1-1.5 tablets (50-75 mg total) by mouth at bedtime as needed for sleep.   triamcinolone lotion 0.1 % Commonly known as:  KENALOG Apply 1 application topically 2 (two) times daily.   TURMERIC PO Take 4 capsules by mouth 2 (two) times daily.          Objective:   Physical Exam BP 126/78 (BP Location: Left Arm, Patient Position: Sitting, Cuff Size: Normal)   Pulse 76   Temp 97.6 F (36.4 C) (Oral)   Resp 14   Ht  (1.778 m)   Wt 273 lb 6 oz (124 kg)   SpO2 96%   BMI 39.23 kg/m  General:   Well developed, well nourished . NAD.  Neck: No  thyromegaly  HEENT:  Normocephalic . Face symmetric, atraumatic Lungs:  CTA B Normal respiratory effort, no intercostal retractions, no accessory muscle use. Heart: RRR,  no murmur.  No pretibial edema bilaterally  Abdomen:  Not distended, soft, non-tender. No rebound or rigidity.   Skin: Rash noted between the elbows and wrists bilaterally and at both legs mostly anteriorly. Abdomen, chest , back  and interdigital area w/o rash. See pictures. Neurologic:  alert & oriented X3.  Speech normal, gait appropriate for age and unassisted Strength symmetric and appropriate for age.  Psych: Cognition and judgment appear intact.  Cooperative with normal attention span  and concentration.  Behavior appropriate. No anxious or depressed appearing.             Assessment & Plan:    Assessment > HTN Hypothyroidism SVT: Cardiac cath 2001: No CAD, ablation 2002 Persistent atrial fibrillation -- on Eliquis started 08-2015 Insomnia -- Xanax was dc 2015 d/t requiring increasing doses, was changed to Ativan: didn't work. Was switch to Ambien: He had drowsiness the following day. On trazodone prn H/o  Fatigue  Sleep-disordered suspected OSA: declined to have a sleep study Increased PSA velocity: Consistently declined to see urology Obesity Back pain-- hydrocodone prn, rx by pcp, low UDS 04-2015   PLAN: HTN: Continue diltiazem, HCTZ, potassium supplements and metoprolol. Last BMP satisfactory. Hypothyroidism: On Synthroid 50, last TSH satisfactory. Atrial fibrillation: Rate controlled, on Eliquis, metoprolol and diltiazem. Check a CBC and FLP Insomnia: on Trazodone, takes usually 1.5 tablets daily. Prescription printed. Back pain: On hydrocodone , takes 2 to 3 tabs a day, rec to not go over 2 tabs, if that is the case will need pain mngmt referral d/t high doses; additionally, last UDS showed amphetamines --> repeat UDS and contract signed today. Addendum- I authorized the pharmacy to RF hydrocodone early today since is just today that I advise to limit intake of pain medication,going forward will limit rx to 2 tabs a day Rash: No systemic symptoms, doubt scabies or pityriasis rosea. Allergic vasculitis?. Will prescribe Kenalog lotion, Claritin, if not better in 2 weeks patient to let me know RTC 6 months    Today, I spent more than 20   min with the patient: >50% of the time counseling regards  a new problem (rash) also answering multiple questions about pain management, explained that hydrocodone is a controlled substance, that taking 3 tablets will require him to see a specialist etc.

## 2016-07-24 NOTE — Assessment & Plan Note (Signed)
HTN: Continue diltiazem, HCTZ, potassium supplements and metoprolol. Last BMP satisfactory. Hypothyroidism: On Synthroid 50, last TSH satisfactory. Atrial fibrillation: Rate controlled, on Eliquis, metoprolol and diltiazem. Check a CBC and FLP Insomnia: on Trazodone, takes usually 1.5 tablets daily. Prescription printed. Back pain: On hydrocodone , takes 2 to 3 tabs a day, rec to not go over 2 tabs, if that is the case will need pain mngmt referral d/t high doses; additionally, last UDS showed amphetamines --> repeat UDS and contract signed today. Addendum- I authorized the pharmacy to RF hydrocodone early today since is just today that I advise to limit intake of pain medication,going forward will limit rx to 2 tabs a day Rash: No systemic symptoms, doubt scabies or pityriasis rosea. Allergic vasculitis?. Will prescribe Kenalog lotion, Claritin, if not better in 2 weeks patient to let me know RTC 6 months

## 2016-07-24 NOTE — Assessment & Plan Note (Addendum)
Td 09 ;  pneumonia shot- 2011 ;  prevnar-- 2015;  shingles immunization--rx  Provided before  Never had a Cscope , again strongly declined colon cancer screening.  Prostate cancer screening  , history of increased PSA declining further eval. I again discussed the benefits of early cancer detection.  Counseled about : diet, exercise. Not ready to quit tobacco.  We had a long discussion last year about the healthcare power of attorney, decided not to pursue that.

## 2016-07-24 NOTE — Telephone Encounter (Signed)
See office visit from today, I ok the  early refill

## 2016-07-24 NOTE — Progress Notes (Signed)
Pre visit review using our clinic review tool, if applicable. No additional management support is needed unless otherwise documented below in the visit note. 

## 2016-07-24 NOTE — Telephone Encounter (Signed)
Warren [name] from [facility] called to get authorization from Dr. [name] to fill Hydrocodone 10-325mg  early, stated last filled on 05/18/2016 #180 tabs, which is 3 weeks early for refill. Pt and PCP discussed at OV today that if Pt needing to take more than twice daily PRN Pt would need to be seen at pain clinic. Per Dr. [name] okay for [facility] to fill early, #180 and 0RF, however if he continues not to take medication as prescribed no early refills will be authorized.

## 2016-07-24 NOTE — Patient Instructions (Addendum)
Get your blood work before you leave , we also need a urine sample for a UDS  Next visit 6 months   Rash: Claritin 10 mg OTC 1 tablet daily for 2 weeks, use of Kenalog lotion twice a day. Call if not better in 2 weeks   Fall Prevention and Home Safety Falls cause injuries and can affect all age groups. It is possible to use preventive measures to significantly decrease the likelihood of falls. There are many simple measures which can make your home safer and prevent falls. OUTDOORS  Repair cracks and edges of walkways and driveways.  Remove high doorway thresholds.  Trim shrubbery on the main path into your home.  Have good outside lighting.  Clear walkways of tools, rocks, debris, and clutter.  Check that handrails are not broken and are securely fastened. Both sides of steps should have handrails.  Have leaves, snow, and ice cleared regularly.  Use sand or salt on walkways during winter months.  In the garage, clean up grease or oil spills. BATHROOM  Install night lights.  Install grab bars by the toilet and in the tub and shower.  Use non-skid mats or decals in the tub or shower.  Place a plastic non-slip stool in the shower to sit on, if needed.  Keep floors dry and clean up all water on the floor immediately.  Remove soap buildup in the tub or shower on a regular basis.  Secure bath mats with non-slip, double-sided rug tape.  Remove throw rugs and tripping hazards from the floors. BEDROOMS  Install night lights.  Make sure a bedside light is easy to reach.  Do not use oversized bedding.  Keep a telephone by your bedside.  Have a firm chair with side arms to use for getting dressed.  Remove throw rugs and tripping hazards from the floor. KITCHEN  Keep handles on pots and pans turned toward the center of the stove. Use back burners when possible.  Clean up spills quickly and allow time for drying.  Avoid walking on wet floors.  Avoid hot utensils  and knives.  Position shelves so they are not too high or low.  Place commonly used objects within easy reach.  If necessary, use a sturdy step stool with a grab bar when reaching.  Keep electrical cables out of the way.  Do not use floor polish or wax that makes floors slippery. If you must use wax, use non-skid floor wax.  Remove throw rugs and tripping hazards from the floor. STAIRWAYS  Never leave objects on stairs.  Place handrails on both sides of stairways and use them. Fix any loose handrails. Make sure handrails on both sides of the stairways are as long as the stairs.  Check carpeting to make sure it is firmly attached along stairs. Make repairs to worn or loose carpet promptly.  Avoid placing throw rugs at the top or bottom of stairways, or properly secure the rug with carpet tape to prevent slippage. Get rid of throw rugs, if possible.  Have an electrician put in a light switch at the top and bottom of the stairs. OTHER FALL PREVENTION TIPS  Wear low-heel or rubber-soled shoes that are supportive and fit well. Wear closed toe shoes.  When using a stepladder, make sure it is fully opened and both spreaders are firmly locked. Do not climb a closed stepladder.  Add color or contrast paint or tape to grab bars and handrails in your home. Place contrasting color strips on  first and last steps.  Learn and use mobility aids as needed. Install an electrical emergency response system.  Turn on lights to avoid dark areas. Replace light bulbs that burn out immediately. Get light switches that glow.  Arrange furniture to create clear pathways. Keep furniture in the same place.  Firmly attach carpet with non-skid or double-sided tape.  Eliminate uneven floor surfaces.  Select a carpet pattern that does not visually hide the edge of steps.  Be aware of all pets. OTHER HOME SAFETY TIPS  Set the water temperature for 120 F (48.8 C).  Keep emergency numbers on or near  the telephone.  Keep smoke detectors on every level of the home and near sleeping areas. Document Released: 11/30/2002 Document Revised: 06/10/2012 Document Reviewed: 02/29/2012 [facility] Patient Information 2015 [place], [place]. This information is not intended to replace advice given to you by your health care provider. Make sure you discuss any questions you have with your health care provider.   Preventive Care for Adults Ages 67 and over  Blood pressure check.** / Every 1 to 2 years.  Lipid and cholesterol check.**/ Every 5 years beginning at age 61.  Lung cancer screening. / Every year if you are aged 46-80 years and have a 30-pack-year history of smoking and currently smoke or have quit within the past 15 years. Yearly screening is stopped once you have quit smoking for at least 15 years or develop a health problem that would prevent you from having lung cancer treatment.  Fecal occult blood test (FOBT) of stool. / Every year beginning at age 61 and continuing until age 58. You may not have to do this test if you get a colonoscopy every 10 years.  Flexible sigmoidoscopy** or colonoscopy.** / Every 5 years for a flexible sigmoidoscopy or every 10 years for a colonoscopy beginning at age 27 and continuing until age 34.  Hepatitis C blood test.** / For all people born from [birth year] through 1966 and any individual with known risks for hepatitis C.  Abdominal aortic aneurysm (AAA) screening.** / A one-time screening for ages 40 to 14 years who are current or former smokers.  Skin self-exam. / Monthly.  Influenza vaccine. / Every year.  Tetanus, diphtheria, and acellular pertussis (Tdap/Td) vaccine.** / 1 dose of Td every 10 years.  Varicella vaccine.** / Consult your health care provider.  Zoster vaccine.** / 1 dose for adults aged 9 years or older.  Pneumococcal 13-valent conjugate (PCV13) vaccine.** / Consult your health care provider.  Pneumococcal polysaccharide (PPSV23)  vaccine.** / 1 dose for all adults aged 23 years and older.  Meningococcal vaccine.** / Consult your health care provider.  Hepatitis A vaccine.** / Consult your health care provider.  Hepatitis B vaccine.** / Consult your health care provider.  Haemophilus influenzae type b (Hib) vaccine.** / Consult your health care provider. **Family history and personal history of risk and conditions may change your health care provider's recommendations. Document Released: 02/05/2002 Document Revised: 12/15/2013 Document Reviewed: 05/07/2011 [facility] Patient Information 2015 [place], [place]. This information is not intended to replace advice given to you by your health care provider. Make sure you discuss any questions you have with your health care provider.

## 2016-07-25 ENCOUNTER — Encounter: Payer: Self-pay | Admitting: Internal Medicine

## 2016-08-02 ENCOUNTER — Telehealth: Payer: Self-pay

## 2016-08-02 NOTE — Telephone Encounter (Signed)
UDS: 07/25/2016  Positive for Hydrocodone   Low risk per Dr. Drue NovelPaz 08/02/2016

## 2016-08-16 ENCOUNTER — Telehealth: Payer: Self-pay | Admitting: Internal Medicine

## 2016-08-16 NOTE — Telephone Encounter (Signed)
New message       Pt states that he was prescribed Eliquis and the price of it has skyrocketed and was wondering if he could be prescribed a comparable medicine. Please call.

## 2016-08-16 NOTE — Telephone Encounter (Signed)
I left a message for the patient to call.  Other alternatives would be: 1) Pradaxa 150 mg BID 2) Xarelto 20 mg once daily  He will need to verify with his pharmacy/ insurance which would be most cost efficient for him.

## 2016-08-20 NOTE — Telephone Encounter (Signed)
Iris D Becton      08/20/16 8:58 AM  Note    New message      Pt wants to get a new prescription since Eliquis cost so much and it does not have a generic. He would like a prescription for Plavix generic which is cheaper. Please call he has been waiting for a couple of days.         *STAT* If patient is at the pharmacy, call can be transferred to refill team.   1. Which medications need to be refilled? (please list name of each medication and dose if known) Plavix 5mg  2. Which pharmacy/location (including street and city if local pharmacy) is medication to be sent to?[facility] [address]  3. Do they need a 30 day or 90 day supply? 30       I spoke with the pt and he plans to contact his insurance again to discuss the cost of Xarelto and Pradaxa.  I made the pt aware that plavix is not an alternative to an anticoagulant.  The pt will run out of his current supply of Eliquis on Thursday. The pt will contact the office once he decides about medication. I also spoke with him about Coumadin and what is required with this medication.

## 2016-08-22 ENCOUNTER — Telehealth: Payer: Self-pay | Admitting: Internal Medicine

## 2016-08-22 NOTE — Telephone Encounter (Signed)
Lm on vm for patient to call back and schedule annual medicare wellness with nurse

## 2016-09-17 ENCOUNTER — Other Ambulatory Visit: Payer: Self-pay | Admitting: Nurse Practitioner

## 2016-09-18 ENCOUNTER — Other Ambulatory Visit: Payer: Self-pay

## 2016-09-18 MED ORDER — HYDROCHLOROTHIAZIDE 25 MG PO TABS: 25.0000 mg | ORAL_TABLET | Freq: Every day | ORAL | 6 refills | Status: DC | PRN

## 2016-10-16 ENCOUNTER — Telehealth: Payer: Self-pay | Admitting: Internal Medicine

## 2016-10-16 ENCOUNTER — Other Ambulatory Visit: Payer: Self-pay | Admitting: Internal Medicine

## 2016-10-16 NOTE — Telephone Encounter (Signed)
Self. HYDROcodone   Pharmacy : [facility] - , [place] - [address]

## 2016-10-17 MED ORDER — HYDROCODONE-ACETAMINOPHEN 10-325 MG PO TABS: 1.0000 | ORAL_TABLET | Freq: Two times a day (BID) | ORAL | 0 refills | Status: DC | PRN

## 2016-10-17 NOTE — Telephone Encounter (Signed)
Pt was returning call to see if prescription was ready (pt called yesterday 10-16-16 for prescription), pt states has meds for 3 days left. Please advise.

## 2016-10-17 NOTE — Telephone Encounter (Signed)
Rx printed, awaiting MD signature.  

## 2016-10-17 NOTE — Telephone Encounter (Signed)
Okay #180, no refills 

## 2016-10-17 NOTE — Telephone Encounter (Signed)
Please inform Pt that Rx has been placed at front desk for pick up at his convenience. Thank you.  

## 2016-10-17 NOTE — Telephone Encounter (Signed)
Pt is requesting refill on Hydrocodone.  Last OV: 07/24/2016 Last Fill: 07/24/2016 #180 and 0RF UDS: 07/25/2016 Low risk  Please advise.

## 2016-10-18 NOTE — Telephone Encounter (Signed)
Pt was called and informed the below. Pt will come to pick up rx.

## 2016-11-22 ENCOUNTER — Other Ambulatory Visit: Payer: Self-pay | Admitting: Internal Medicine

## 2016-12-18 ENCOUNTER — Other Ambulatory Visit: Payer: Self-pay | Admitting: Internal Medicine

## 2017-01-11 ENCOUNTER — Other Ambulatory Visit: Payer: Self-pay | Admitting: Internal Medicine

## 2017-01-14 ENCOUNTER — Telehealth: Payer: Self-pay | Admitting: Internal Medicine

## 2017-01-14 MED ORDER — HYDROCODONE-ACETAMINOPHEN 10-325 MG PO TABS: 1.0000 | ORAL_TABLET | Freq: Two times a day (BID) | ORAL | 0 refills | Status: DC | PRN

## 2017-01-14 NOTE — Telephone Encounter (Signed)
Okay #180, no refills 

## 2017-01-14 NOTE — Addendum Note (Signed)
[place]: [name] D on: 01/14/2017 01:22 PM   Modules accepted: Orders

## 2017-01-14 NOTE — Telephone Encounter (Signed)
5.2.16 PR PPPS, SUBSEQ VISIT [[id number]] lvm advising patient to schedule medicare wellness and 6 month follow up.

## 2017-01-14 NOTE — Telephone Encounter (Signed)
Please inform Pt that Rx has been placed at front desk for pick up at his convenience. Thank you.  

## 2017-01-14 NOTE — Telephone Encounter (Signed)
LVM informing patient that rx is ready to be picked up.

## 2017-01-14 NOTE — Telephone Encounter (Signed)
Patient is requesting a refill of HYDROcodone-acetaminophen (NORCO) 10-325 MG tablet He states he is completely out due to our snow days. Please advise  Phone: [phone]

## 2017-01-14 NOTE — Telephone Encounter (Signed)
Rx printed, awaiting MD signature.  

## 2017-01-14 NOTE — Telephone Encounter (Signed)
Pt is requesting refill on Hydrocodone.  Last OV: 07/24/2016 Last Fill: 10/17/2016 #180 and 0RF UDS: 07/25/2016 Low risk  Please advise.

## 2017-04-10 NOTE — Progress Notes (Signed)
Pre visit review using our clinic review tool, if applicable. No additional management support is needed unless otherwise documented below in the visit note. 

## 2017-04-10 NOTE — Progress Notes (Signed)
Subjective:   Warren [name] is a 71 y.o. male who presents for Medicare Annual/Subsequent preventive examination.  Review of Systems:  No ROS.  Medicare Wellness Visit. Cardiac Risk Factors include: dyslipidemia;advanced age (>76men, >40 women);male gender;obesity (BMI >30kg/m2);sedentary lifestyle;smoking/ tobacco exposure;hypertension Sleep patterns: Takes trazodone. Sleeps 11a-5pm.  Home Safety/Smoke Alarms:  Feels safe in home. Smoke alarms in place.  Living environment; residence and Firearm Safety: Lives alone. No stairs. Guns not safely stored.    Counseling:   Eye Exam- Wear glasses. Eye doctor annually. Dental- Dr.Kingsley as needed.  Male:   CCS- pt declines. PSA-  Lab Results  Component Value Date   PSA 4.54 (H) 04/25/2015   PSA 5.10 (H) 01/29/2013   PSA 5.19 (H) 01/29/2012        Objective:    Vitals: BP 123/73 (BP Location: Right Arm, Patient Position: Sitting, Cuff Size: Large)   Pulse 80   Ht  (1.778 m)   Wt 264 lb 6.4 oz (119.9 kg)   SpO2 96%   BMI 37.94 kg/m   Body mass index is 37.94 kg/m.  Tobacco History  Smoking Status  . Current Every Day Smoker  . Types: Cigars  Smokeless Tobacco  . Never Used    Comment: HAS 2 LARGE CIGARS A DAY     Ready to quit: Not Answered Counseling given: Yes   Past Medical History:  Diagnosis Date  . Hypertension   . Hypothyroidism   . Increased prostate specific antigen (PSA) velocity   . Morbid obesity (HCC)   . Persistent atrial fibrillation (HCC)   . Sleep-disordered breathing   . Supraventricular tachycardia (HCC)    a. s/p concealed left lateral pathway ablation 2002 Dr Graciela Husbands   Past Surgical History:  Procedure Laterality Date  . ABLATION  2002   concealed left lateral pathway ablation by Dr Graciela Husbands  . APPENDECTOMY    . CARDIAC CATHETERIZATION  2001   NO CAD  . NM MYOVIEW LTD     12-09  . TONSILLECTOMY     Family History  Problem Relation Age of Onset  . Adopted: Yes  . Family  history unknown: Yes   History  Sexual Activity  . Sexual activity: Not on file    Outpatient Encounter Prescriptions as of 04/15/2017  Medication Sig  . co-enzyme Q-10 30 MG capsule Take 30 mg by mouth daily.   . COD LIVER OIL PO Take 2 tablets by mouth daily.   Marland Kitchen diltiazem (CARTIA XT) 120 MG 24 hr capsule Take 1 capsule (120 mg total) by mouth daily.  Marland Kitchen ELIQUIS 5 MG TABS tablet take 1 tablet by mouth twice a day  . fish oil-omega-3 fatty acids 1000 MG capsule Take 2 g by mouth daily.    . hydrochlorothiazide (HYDRODIURIL) 25 MG tablet Take 1 tablet (25 mg total) by mouth daily as needed.  Marland Kitchen HYDROcodone-acetaminophen (NORCO) 10-325 MG tablet Take 1 tablet by mouth 2 (two) times daily as needed for moderate pain.  Marland Kitchen levothyroxine (SYNTHROID, LEVOTHROID) 50 MCG tablet Take 1 tablet (50 mcg total) by mouth daily before breakfast.  . metoprolol (TOPROL-XL) 200 MG 24 hr tablet Take 1 tablet (200 mg total) by mouth daily.  . Multiple Vitamins-Minerals (CENTRUM SILVER PO) Take 1 each by mouth daily.    Marland Kitchen omeprazole (PRILOSEC) 40 MG capsule Take 1 capsule (40 mg total) by mouth daily.  . potassium chloride SA (K-DUR,KLOR-CON) 20 MEQ tablet take 3 tablets by mouth once daily  . traZODone (DESYREL)  50 MG tablet Take 1-1.5 tablets (50-75 mg total) by mouth at bedtime as needed for sleep.  Marland Kitchen triamcinolone lotion (KENALOG) 0.1 % Apply 1 application topically 2 (two) times daily.  . TURMERIC PO Take 4 capsules by mouth 2 (two) times daily.   . nitroGLYCERIN (NITROSTAT) 0.4 MG SL tablet Place 1 tablet (0.4 mg total) under the tongue every 5 (five) minutes as needed for chest pain (x 3 doses). (Patient not taking: Reported on 07/24/2016)   No facility-administered encounter medications on file as of 04/15/2017.     Activities of Daily Living In your present state of health, do you have any difficulty performing the following activities: 04/15/2017 07/24/2016  Hearing? N N  Vision? N N  Difficulty  concentrating or making decisions? N N  Walking or climbing stairs? N N  Dressing or bathing? N N  Doing errands, shopping? Y N  Preparing Food and eating ? N -  Using the Toilet? N -  In the past six months, have you accidently leaked urine? N -  Do you have problems with loss of bowel control? N -  Managing your Medications? N -  Managing your Finances? N -  Housekeeping or managing your Housekeeping? N -  Some recent data might be hidden    Patient Care Team: Wanda Plump, MD as PCP - General   Assessment:    Physical assessment deferred to PCP.  Exercise Activities and Dietary recommendations Current Exercise Habits: The patient does not participate in regular exercise at present, Exercise limited by: None identified Diet (meal preparation, eat out, water intake, caffeinated beverages, dairy products, fruits and vegetables): in general, an "unhealthy" diet   Goals      Patient Stated   . To wake up each day. (pt-stated)      Fall Risk Fall Risk  04/15/2017 07/24/2016 03/13/2016 11/01/2015 04/25/2015  Falls in the past year? No No No No No   Depression Screen PHQ 2/9 Scores 04/15/2017 07/24/2016 03/13/2016 11/01/2015  PHQ - 2 Score 0 0 0 0    Cognitive Function Ad8 score reviewed for issues:  Issues making decisions:no  Less interest in hobbies / activities:no  Repeats questions, stories (family complaining):no  Trouble using ordinary gadgets (microwave, computer, phone):no  Forgets the month or year: no  Mismanaging finances: no  Remembering appts:no Daily problems with thinking and/or memory:no Ad8 score is=0            Immunization History  Administered Date(s) Administered  . Influenza Whole 12/24/2005, 10/12/2008, 12/13/2009, 09/13/2010  . Influenza, High Dose Seasonal PF 10/31/2015  . Influenza,inj,Quad PF,36+ Mos 10/25/2014  . Pneumococcal Conjugate-13 04/21/2014  . Pneumococcal Polysaccharide-23 12/14/2010  . Td 10/12/2008   Screening  Tests Health Maintenance  Topic Date Due  . COLONOSCOPY  03/04/1995  . Hepatitis C Screening  07/23/2017 (Originally 08-20-1946)  . INFLUENZA VACCINE  07/24/2017  . TETANUS/TDAP  10/12/2018  . PNA vac Low Risk Adult  Completed      Plan:     Follow up with PCP as directed.  During the course of the visit the patient was educated and counseled about the following appropriate screening and preventive services:   Vaccines to include Pneumoccal, Influenza, Td, HCV  Cardiovascular Disease  Colorectal cancer screening-pt declines  Diabetes screening  Prostate Cancer Screening- will discuss with Dr.Paz today.  Glaucoma screening  Nutrition counseling   Smoking cessation counseling  Patient Instructions (the written plan) was given to the patient.    Moshe Cipro,  Maryclare Bean, RN  04/15/2017  Willow Ora, MD

## 2017-04-15 ENCOUNTER — Ambulatory Visit (INDEPENDENT_AMBULATORY_CARE_PROVIDER_SITE_OTHER): Payer: Medicare Other | Admitting: Internal Medicine

## 2017-04-15 ENCOUNTER — Encounter: Payer: Self-pay | Admitting: Internal Medicine

## 2017-04-15 VITALS — BP 123/73 | HR 80 | Ht 70.0 in | Wt 264.4 lb

## 2017-04-15 DIAGNOSIS — I10 Essential (primary) hypertension: Secondary | ICD-10-CM

## 2017-04-15 DIAGNOSIS — R7989 Other specified abnormal findings of blood chemistry: Secondary | ICD-10-CM

## 2017-04-15 DIAGNOSIS — R946 Abnormal results of thyroid function studies: Secondary | ICD-10-CM | POA: Diagnosis not present

## 2017-04-15 DIAGNOSIS — Z0000 Encounter for general adult medical examination without abnormal findings: Secondary | ICD-10-CM

## 2017-04-15 DIAGNOSIS — Z1159 Encounter for screening for other viral diseases: Secondary | ICD-10-CM

## 2017-04-15 DIAGNOSIS — I4891 Unspecified atrial fibrillation: Secondary | ICD-10-CM | POA: Diagnosis not present

## 2017-04-15 LAB — COMPREHENSIVE METABOLIC PANEL
ALBUMIN: 4.1 g/dL (ref 3.5–5.2)
ALK PHOS: 47 U/L (ref 39–117)
ALT: 22 U/L (ref 0–53)
AST: 20 U/L (ref 0–37)
BILIRUBIN TOTAL: 0.5 mg/dL (ref 0.2–1.2)
BUN: 20 mg/dL (ref 6–23)
CO2: 31 mEq/L (ref 19–32)
Calcium: 9.6 mg/dL (ref 8.4–10.5)
Chloride: 106 mEq/L (ref 96–112)
Creatinine, Ser: 1.27 mg/dL (ref 0.40–1.50)
GFR: 59.23 mL/min — ABNORMAL LOW (ref >60.00)
GLUCOSE: 107 mg/dL — AB (ref 70–99)
POTASSIUM: 4.2 meq/L (ref 3.5–5.1)
SODIUM: 143 meq/L (ref 135–145)
TOTAL PROTEIN: 6.7 g/dL (ref 6.0–8.3)

## 2017-04-15 LAB — URINALYSIS, ROUTINE W REFLEX MICROSCOPIC
Bilirubin Urine: NEGATIVE
Hgb urine dipstick: NEGATIVE
LEUKOCYTES UA: NEGATIVE
Nitrite: NEGATIVE
PH: 5.5 (ref 5.0–8.0)
RBC / HPF: NONE SEEN (ref 0–?)
Total Protein, Urine: NEGATIVE
Urine Glucose: NEGATIVE
Urobilinogen, UA: 0.2 (ref 0.0–1.0)

## 2017-04-15 LAB — TSH: TSH: 3.09 u[IU]/mL (ref 0.35–4.50)

## 2017-04-15 MED ORDER — HYDROCODONE-ACETAMINOPHEN 10-325 MG PO TABS: 1.0000 | ORAL_TABLET | Freq: Two times a day (BID) | ORAL | 0 refills | Status: DC | PRN

## 2017-04-15 NOTE — Progress Notes (Signed)
Subjective:    Patient ID: Warren [name], male    DOB: 1946-12-12, 71 y.o.   MRN: [id number]  DOS:  04/15/2017 Type of visit - description : rov Interval history: In general feeling well. Back pain: Well-controlled with hydrocodone 2 day HTN: Good medication compliance, ambulatory BPs within normal. Insomnia: Trazodone works well for him.    Review of Systems Denies difficulty urinating, dysuria gross hematuria. Would like a urine test     Past Medical History:  Diagnosis Date  . Hypertension   . Hypothyroidism   . Increased prostate specific antigen (PSA) velocity   . Morbid obesity (HCC)   . Persistent atrial fibrillation (HCC)   . Sleep-disordered breathing   . Supraventricular tachycardia (HCC)    a. s/p concealed left lateral pathway ablation 2002 Dr Graciela Husbands    Past Surgical History:  Procedure Laterality Date  . ABLATION  2002   concealed left lateral pathway ablation by Dr Graciela Husbands  . APPENDECTOMY    . CARDIAC CATHETERIZATION  2001   NO CAD  . NM MYOVIEW LTD     12-09  . TONSILLECTOMY      Social History   Social History  . Marital status: Single    Spouse name: N/A  . Number of children: 0  . Years of education: N/A   Occupational History  . 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 Smoking status: Current Every Day Smoker    Types: Cigars  . Smokeless tobacco: Never Used     Comment: HAS 2 LARGE CIGARS A DAY  . Alcohol use 0.0 oz/week     Comment: RARE  . Drug use: No  . Sexual activity: Not on file   Other Topics Concern  . Not on file   Social History Narrative   Lives by himself , has no family,  for emergency contact he provided today the number of a  neighbor [phone]                    Allergies as of 04/15/2017   No Known Allergies     Medication List       Accurate as of 04/15/17 11:59 PM. Always use your most recent med list.          CENTRUM SILVER PO Take 1 each  by mouth daily.   co-enzyme Q-10 30 MG capsule Take 30 mg by mouth daily.   COD LIVER OIL PO Take 2 tablets by mouth daily.   diltiazem 120 MG 24 hr capsule Commonly known as:  CARTIA XT Take 1 capsule (120 mg total) by mouth daily.   ELIQUIS 5 MG Tabs tablet Generic drug:  apixaban take 1 tablet by mouth twice a day   fish oil-omega-3 fatty acids 1000 MG capsule Take 2 g by mouth daily.   hydrochlorothiazide 25 MG tablet Commonly known as:  HYDRODIURIL Take 1 tablet (25 mg total) by mouth daily as needed.   HYDROcodone-acetaminophen 10-325 MG tablet Commonly known as:  NORCO Take 1 tablet by mouth 2 (two) times daily as needed for moderate pain.   levothyroxine 50 MCG tablet Commonly known as:  SYNTHROID, LEVOTHROID Take 1 tablet (50 mcg total) by mouth daily before breakfast.   metoprolol 200 MG 24 hr tablet Commonly known as:  TOPROL-XL Take 1 tablet (200 mg total) by mouth daily.   nitroGLYCERIN 0.4 MG SL tablet Commonly known as:  NITROSTAT Place  1 tablet (0.4 mg total) under the tongue every 5 (five) minutes as needed for chest pain (x 3 doses).   omeprazole 40 MG capsule Commonly known as:  PRILOSEC Take 1 capsule (40 mg total) by mouth daily.   potassium chloride SA 20 MEQ tablet Commonly known as:  K-DUR,KLOR-CON take 3 tablets by mouth once daily   traZODone 50 MG tablet Commonly known as:  DESYREL Take 1-1.5 tablets (50-75 mg total) by mouth at bedtime as needed for sleep.   triamcinolone lotion 0.1 % Commonly known as:  KENALOG Apply 1 application topically 2 (two) times daily.   TURMERIC PO Take 4 capsules by mouth 2 (two) times daily.          Objective:   Physical Exam BP 123/73 (BP Location: Right Arm, Patient Position: Sitting, Cuff Size: Large)   Pulse 80   Ht  (1.778 m)   Wt 264 lb 6.4 oz (119.9 kg)   SpO2 96%   BMI 37.94 kg/m  General:   Well developed, well nourished . NAD.  HEENT:  Normocephalic . Face symmetric,  atraumatic Lungs:  CTA B Normal respiratory effort, no intercostal retractions, no accessory muscle use. Heart: RRR,  no murmur.  No pretibial edema bilaterally  Skin: Not pale. Not jaundice Neurologic:  alert & oriented X3.  Speech normal, gait appropriate for age and unassisted Psych--  Cognition and judgment appear intact.  Cooperative with normal attention span and concentration.  Behavior appropriate. No anxious or depressed appearing.      Assessment & Plan:   Assessment > HTN Hypothyroidism SVT: Cardiac cath 2001: No CAD, ablation 2002 Persistent atrial fibrillation -- on Eliquis started 08-2015 Insomnia -- Xanax was dc 2015 d/t requiring increasing doses, was changed to Ativan: didn't work. Was switch to Ambien: He had drowsiness the following day. On trazodone prn H/o  Fatigue Sleep-disordered suspected OSA: declined to have a sleep study Increased PSA velocity: Consistently declined to see urology Obesity Back pain-- hydrocodone prn, rx by pcp, low UDS 04-2015   PLAN: HTN: Seems well-controlled on Cartia, HCTZ, Toprol, potassium. Check a CMP Hypothyroidism: On Synthroid, check a TSH A. fib: Rate controlled, anticoagulated. Back pain: UDS low risk 07-2016, refill meds today Would like his urine check. Will do Primary care: Hep C screening RTC 07-2017, CPX.

## 2017-04-15 NOTE — Patient Instructions (Signed)
GO TO THE LAB : Get the blood work     GO TO THE FRONT DESK Schedule your next appointment for a  physical exam by 07-2017  

## 2017-04-16 LAB — HEPATITIS C ANTIBODY: HCV Ab: NEGATIVE

## 2017-04-16 NOTE — Assessment & Plan Note (Signed)
HTN: Seems well-controlled on Cartia, HCTZ, Toprol, potassium. Check a CMP Hypothyroidism: On Synthroid, check a TSH A. fib: Rate controlled, anticoagulated. Back pain: UDS low risk 07-2016, refill meds today Would like his urine check. Will do Primary care: Hep C screening RTC 07-2017, CPX.

## 2017-04-21 ENCOUNTER — Other Ambulatory Visit: Payer: Self-pay | Admitting: Internal Medicine

## 2017-05-10 ENCOUNTER — Other Ambulatory Visit: Payer: Self-pay | Admitting: Nurse Practitioner

## 2017-05-20 ENCOUNTER — Other Ambulatory Visit: Payer: Self-pay | Admitting: Internal Medicine

## 2017-07-08 ENCOUNTER — Telehealth: Payer: Self-pay | Admitting: Internal Medicine

## 2017-07-08 MED ORDER — TRAZODONE HCL 50 MG PO TABS: 50.0000 mg | ORAL_TABLET | Freq: Every evening | ORAL | 2 refills | Status: DC | PRN

## 2017-07-08 MED ORDER — HYDROCODONE-ACETAMINOPHEN 10-325 MG PO TABS: 1.0000 | ORAL_TABLET | Freq: Two times a day (BID) | ORAL | 0 refills | Status: DC | PRN

## 2017-07-08 NOTE — Telephone Encounter (Signed)
Rx printed, awaiting MD signature.  

## 2017-07-08 NOTE — Telephone Encounter (Signed)
Pt is requesting refill on Trazodone 50mg ; Pt sig: 1-1.5 tabs by mouth qhs prn.   Pt also on hydrocodone 10-325mg .   Okay for Pt to be on both meds?

## 2017-07-08 NOTE — Telephone Encounter (Signed)
Okay #180, no refills 

## 2017-07-08 NOTE — Telephone Encounter (Signed)
Caller name: Kelvon  Relation to pt: self Call back number: [phone] Pharmacy:  Reason for call: Pt is needing refill on HYDROcodone-acetaminophen (NORCO) 10-325 MG tablet. Pt stated will be out of meds on Wednesday. Please advise.

## 2017-07-08 NOTE — Telephone Encounter (Signed)
Called, lvm making pt aware.    Thanks.

## 2017-07-08 NOTE — Telephone Encounter (Signed)
[name]- please inform Pt that Rx has been placed at front desk for pick up at his convenience. Thank you.

## 2017-07-08 NOTE — Telephone Encounter (Signed)
Trazodone sig changed, Rx sent.

## 2017-07-08 NOTE — Telephone Encounter (Signed)
Pt is requesting refill on hydrocodone.   Last OV: 04/15/2017 Last Fill: 04/15/2017 #180 and 0rf Pt sig: 1 tab bid prn UDS: 07/24/2016 Low risk  [place] Controlled Substance database printed; no issues noted  Please advise.

## 2017-07-08 NOTE — Telephone Encounter (Signed)
Self.   Refill request for traZODone    [facility] [place]

## 2017-07-08 NOTE — Telephone Encounter (Signed)
Change trazodone sig to one tablet daily at bedtime when necessary, one-month supply and 2 refills

## 2017-07-10 ENCOUNTER — Telehealth: Payer: Self-pay | Admitting: Internal Medicine

## 2017-07-10 MED ORDER — TRAZODONE HCL 50 MG PO TABS: 75.0000 mg | ORAL_TABLET | Freq: Every evening | ORAL | 2 refills | Status: DC | PRN

## 2017-07-10 NOTE — Telephone Encounter (Signed)
That is  okay, go back to trazodone 1.5 tablet daily at bedtime when necessary #45,  2 refills

## 2017-07-10 NOTE — Telephone Encounter (Signed)
Relation to ZO:XWRUpt:self Call back number:[phone] [facility] [address] [phone]    Reason for call:  Patient states at last office visit 04/15/17 direction change informing PCP he was taking a tablet and 1/2 of  traZODone (DESYREL) 50 MG tablet, patient requesting a refill indicating direction change please advise

## 2017-07-10 NOTE — Telephone Encounter (Signed)
Rx re-sent, LMOM informing Pt.

## 2017-07-21 ENCOUNTER — Other Ambulatory Visit: Payer: Self-pay | Admitting: Internal Medicine

## 2017-08-05 ENCOUNTER — Telehealth: Payer: Self-pay

## 2017-08-05 NOTE — Telephone Encounter (Signed)
UDS: 07/11/2017  Hydrocodone: detected   Low risk per PCP 08/05/2017

## 2017-08-20 ENCOUNTER — Other Ambulatory Visit: Payer: Self-pay | Admitting: Family Medicine

## 2017-10-08 ENCOUNTER — Telehealth: Payer: Self-pay | Admitting: Internal Medicine

## 2017-10-08 MED ORDER — HYDROCODONE-ACETAMINOPHEN 10-325 MG PO TABS: 1.0000 | ORAL_TABLET | Freq: Two times a day (BID) | ORAL | 0 refills | Status: DC | PRN

## 2017-10-08 NOTE — Telephone Encounter (Signed)
Pt is requesting refill on hydrocodone.   Last OV: 04/15/2017  Last Fill: 07/08/2017 #180 and 0RF UDS: 07/11/2017 Low risk  [place] printed; no issues noted  Please advise.

## 2017-10-08 NOTE — Telephone Encounter (Signed)
Okay #60, needs office visit

## 2017-10-08 NOTE — Telephone Encounter (Signed)
Rx printed, awaiting MD signature.  

## 2017-10-08 NOTE — Telephone Encounter (Signed)
Warren [name] Self [phone]  HYDROcodone-acetaminophen [facility]) 10-325 MG tablet   [name] called to say he needs a refill and would like to come by tomorrow and pick up because he is going to be in town. Please call and advise.

## 2017-10-09 NOTE — Telephone Encounter (Signed)
Please inform Pt that Rx has been placed at front desk for pick up at his convenience. Thank you.  

## 2017-10-09 NOTE — Telephone Encounter (Signed)
Patient informed and will pick up Rx at noon.

## 2017-10-16 ENCOUNTER — Encounter: Payer: Self-pay | Admitting: Internal Medicine

## 2017-10-16 ENCOUNTER — Ambulatory Visit (INDEPENDENT_AMBULATORY_CARE_PROVIDER_SITE_OTHER): Payer: Medicare Other | Admitting: Internal Medicine

## 2017-10-16 VITALS — BP 118/86 | HR 70 | Temp 97.4°F | Resp 12 | Wt 249.5 lb

## 2017-10-16 DIAGNOSIS — I10 Essential (primary) hypertension: Secondary | ICD-10-CM

## 2017-10-16 DIAGNOSIS — I481 Persistent atrial fibrillation: Secondary | ICD-10-CM | POA: Diagnosis not present

## 2017-10-16 DIAGNOSIS — Z23 Encounter for immunization: Secondary | ICD-10-CM

## 2017-10-16 DIAGNOSIS — E079 Disorder of thyroid, unspecified: Secondary | ICD-10-CM

## 2017-10-16 DIAGNOSIS — R0789 Other chest pain: Secondary | ICD-10-CM

## 2017-10-16 DIAGNOSIS — R197 Diarrhea, unspecified: Secondary | ICD-10-CM | POA: Diagnosis not present

## 2017-10-16 DIAGNOSIS — I4819 Other persistent atrial fibrillation: Secondary | ICD-10-CM

## 2017-10-16 LAB — CBC WITH DIFFERENTIAL/PLATELET
Basophils Absolute: 0 10*3/uL (ref 0.0–0.1)
Basophils Relative: 0.5 % (ref 0.0–3.0)
EOS ABS: 0.2 10*3/uL (ref 0.0–0.7)
EOS PCT: 1.7 % (ref 0.0–5.0)
HCT: 46.8 % (ref 39.0–52.0)
Hemoglobin: 15.6 g/dL (ref 13.0–17.0)
Lymphocytes Relative: 27 % (ref 12.0–46.0)
Lymphs Abs: 2.7 10*3/uL (ref 0.7–4.0)
MCHC: 33.4 g/dL (ref 30.0–36.0)
MCV: 92.5 fl (ref 78.0–100.0)
MONO ABS: 0.8 10*3/uL (ref 0.1–1.0)
Monocytes Relative: 8 % (ref 3.0–12.0)
NEUTROS PCT: 62.8 % (ref 43.0–77.0)
Neutro Abs: 6.2 10*3/uL (ref 1.4–7.7)
Platelets: 199 10*3/uL (ref 150.0–400.0)
RBC: 5.06 Mil/uL (ref 4.22–5.81)
RDW: 14.5 % (ref 11.5–15.5)
WBC: 9.9 10*3/uL (ref 4.0–10.5)

## 2017-10-16 LAB — TSH: TSH: 4.8 u[IU]/mL — AB (ref 0.35–4.50)

## 2017-10-16 LAB — BASIC METABOLIC PANEL
BUN: 21 mg/dL (ref 6–23)
CALCIUM: 9.5 mg/dL (ref 8.4–10.5)
CO2: 32 mEq/L (ref 19–32)
CREATININE: 1.4 mg/dL (ref 0.40–1.50)
Chloride: 104 mEq/L (ref 96–112)
GFR: 52.85 mL/min — AB (ref >60.00)
GLUCOSE: 108 mg/dL — AB (ref 70–99)
Potassium: 4.9 mEq/L (ref 3.5–5.1)
Sodium: 141 mEq/L (ref 135–145)

## 2017-10-16 MED ORDER — TRAZODONE HCL 50 MG PO TABS: 75.0000 mg | ORAL_TABLET | Freq: Every evening | ORAL | 6 refills | Status: DC | PRN

## 2017-10-16 MED ORDER — HYDROCODONE-ACETAMINOPHEN 10-325 MG PO TABS: 1.0000 | ORAL_TABLET | Freq: Two times a day (BID) | ORAL | 0 refills | Status: DC | PRN

## 2017-10-16 MED ORDER — HYDROCHLOROTHIAZIDE 25 MG PO TABS: ORAL_TABLET | ORAL | 5 refills | Status: DC

## 2017-10-16 NOTE — Progress Notes (Signed)
Subjective:    Patient ID: Warren [name], male    DOB: March 04, 1946, 71 y.o.   MRN: [id number]  DOS:  10/16/2017 Type of visit - description : rov Interval history: HTN: good med compliance, no amb BPs Back pain: Still an issue, taking on average 2 hydrocodones a day.  Pain is worse the days he does  bending or yard work which he is avoiding Also reports loose stools for few weeks, thinks is related to a new candy ("comes w/ a warning that may cause diarrhea")  he is getting every day. Denies blood in the stools, fever chills, abdominal pain. Insomnia: On trazodone, reports is having problems getting his refills. Hypothyroidism: Due for labs   Review of Systems  denies fever chills No difficulty breathing or palpitations. For the last few weeks has noted left upper chest discomfort, only when he turns in bed in certain ways, particularly when he rolls over the right side. He is able to walk and go shopping without any symptoms.    Past Medical History:  Diagnosis Date  . Hypertension   . Hypothyroidism   . Increased prostate specific antigen (PSA) velocity   . Morbid obesity (HCC)   . Persistent atrial fibrillation (HCC)   . Sleep-disordered breathing   . Supraventricular tachycardia (HCC)    a. s/p concealed left lateral pathway ablation 2002 Dr Graciela Husbands    Past Surgical History:  Procedure Laterality Date  . ABLATION  2002   concealed left lateral pathway ablation by Dr Graciela Husbands  . APPENDECTOMY    . CARDIAC CATHETERIZATION  2001   NO CAD  . NM MYOVIEW LTD     12-09  . TONSILLECTOMY      Social History   Social History  . Marital status: Single    Spouse name: N/A  . Number of children: 0  . Years of education: N/A   Occupational History  . 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 Smoking status: Current Every Day Smoker    Types: Cigars  . Smokeless tobacco: Never Used     Comment: HAS 2 LARGE CIGARS A DAY  .  Alcohol use 0.0 oz/week     Comment: RARE  . Drug use: No  . Sexual activity: Not on file   Other Topics Concern  . Not on file   Social History Narrative   Lives by himself , has no family,  for emergency contact he provided today the number of a  neighbor [phone]                    Allergies as of 10/16/2017   No Known Allergies     Medication List       Accurate as of 10/16/17 11:59 PM. Always use your most recent med list.          CENTRUM SILVER PO Take 1 each by mouth daily.   co-enzyme Q-10 30 MG capsule Take 30 mg by mouth daily.   COD LIVER OIL PO Take 2 tablets by mouth daily.   diltiazem 120 MG 24 hr capsule Commonly known as:  CARDIZEM CD Take 1 capsule (120 mg total) by mouth daily.   ELIQUIS 5 MG Tabs tablet Generic drug:  apixaban take 1 tablet by mouth twice a day   fish oil-omega-3 fatty acids 1000 MG capsule Take 2 g by mouth daily.   hydrochlorothiazide 25 MG tablet Commonly known  as:  HYDRODIURIL take 1 tablet by mouth once daily if needed   HYDROcodone-acetaminophen 10-325 MG tablet Commonly known as:  NORCO Take 1 tablet by mouth 2 (two) times daily as needed for moderate pain.   levothyroxine 50 MCG tablet Commonly known as:  SYNTHROID, LEVOTHROID Take 1 tablet (50 mcg total) by mouth daily before breakfast.   metoprolol 200 MG 24 hr tablet Commonly known as:  TOPROL-XL Take 1 tablet (200 mg total) by mouth daily.   nitroGLYCERIN 0.4 MG SL tablet Commonly known as:  NITROSTAT Place 1 tablet (0.4 mg total) under the tongue every 5 (five) minutes as needed for chest pain (x 3 doses).   omeprazole 40 MG capsule Commonly known as:  PRILOSEC Take 1 capsule (40 mg total) by mouth daily.   potassium chloride SA 20 MEQ tablet Commonly known as:  K-DUR,KLOR-CON Take 3 tablets (60 mEq total) by mouth daily.   traZODone 50 MG tablet Commonly known as:  DESYREL Take 1.5 tablets (75 mg total) by mouth at bedtime as needed  for sleep.   triamcinolone lotion 0.1 % Commonly known as:  KENALOG Apply 1 application topically 2 (two) times daily.   TURMERIC PO Take 4 capsules by mouth 2 (two) times daily.          Objective:   Physical Exam BP 118/86 (BP Location: Left Arm, Patient Position: Sitting, Cuff Size: Normal)   Pulse 70   Temp (!) 97.4 F (36.3 C) (Oral)   Resp 12   Wt 249 lb 8 oz (113.2 kg)   SpO2 95%   BMI 35.80 kg/m  General:   Well developed, well nourished . NAD.  HEENT:  Normocephalic . Face symmetric, atraumatic Lungs:  CTA B Normal respiratory effort, no intercostal retractions, no accessory muscle use. Heart: Seems regular today,  no murmur.  No pretibial edema bilaterally  Skin: Not pale. Not jaundice Neurologic:  alert & oriented X3.  Speech normal, gait appropriate for age and unassisted Psych--  Cognition and judgment appear intact.  Cooperative with normal attention span and concentration.  Behavior appropriate. No anxious or depressed appearing.      Assessment & Plan:   Assessment   HTN Hypothyroidism SVT: Cardiac cath 2001: No CAD, ablation 2002 Persistent atrial fibrillation -- on Eliquis started 08-2015 Insomnia -- Xanax was dc 2015 d/t requiring increasing doses, was changed to Ativan: didn't work. Was switch to Ambien: He had drowsiness the following day. On trazodone prn H/o  Fatigue Sleep-disordered suspected OSA: declined to have a sleep study Increased PSA velocity: Consistently declined to see urology Obesity Back pain-- hydrocodone prn, rx by pcp   PLAN:  HTN: Continue Cardizem, hydrochlorothiazide, Toprol, potassium. Check a BMP  Hypothyroidism: On Synthroid, check a TSH Atrial fibrillation: Anticoagulated, no apparent complications, check a CBC. Back pain: Chronic issue, currently on hydrocodone BID, patient wonders if he could increase the dose. I rec  physical therapy, a muscle relaxant, and additional Tylenol daily as a way to obtain better  control without increasing hydrocodone. he cannot take NSAIDs due to anticoagulation. He will think about the  options we discussed. Last UDS low risk. Prescription printed for #180 hydrocodone Diarrhea: See HPI, he is planning to stop they candy he feels is the culprit and tell me if no better Chest pain: See ROS, very atypical, rec  to call me if something changes Insomnia: Reports difficulty with trazodone RFs, paper prescription provided. Preventive care: Flu shot, pneumonia shot today. RTC 6 months, CPX,  fasting

## 2017-10-16 NOTE — Patient Instructions (Signed)
GO TO THE LAB : Get the blood work     GO TO THE FRONT DESK Schedule your next appointment for a physical exam in 6 months, fasting.    

## 2017-10-17 NOTE — Assessment & Plan Note (Signed)
HTN: Continue Cardizem, hydrochlorothiazide, Toprol, potassium. Check a BMP  Hypothyroidism: On Synthroid, check a TSH Atrial fibrillation: Anticoagulated, no apparent complications, check a CBC. Back pain: Chronic issue, currently on hydrocodone BID, patient wonders if he could increase the dose. I rec  physical therapy, a muscle relaxant, and additional Tylenol daily as a way to obtain better control without increasing hydrocodone. he cannot take NSAIDs due to anticoagulation. He will think about the  options we discussed. Last UDS low risk. Prescription printed for #180 hydrocodone Diarrhea: See HPI, he is planning to stop they candy he feels is the culprit and tell me if no better Chest pain: See ROS, very atypical, rec  to call me if something changes Insomnia: Reports difficulty with trazodone RFs, paper prescription provided. Preventive care: Flu shot, pneumonia shot today. RTC 6 months, CPX, fasting

## 2017-10-22 MED ORDER — LEVOTHYROXINE SODIUM 75 MCG PO TABS: 75.0000 ug | ORAL_TABLET | Freq: Every day | ORAL | 0 refills | Status: DC

## 2017-10-22 NOTE — Addendum Note (Signed)
Addended by: Conrad BurlingtonANTER, Jemell Town D on: 10/22/2017 09:28 AM   Modules accepted: Orders

## 2017-10-25 ENCOUNTER — Other Ambulatory Visit: Payer: Self-pay | Admitting: Internal Medicine

## 2017-11-11 ENCOUNTER — Encounter: Payer: Self-pay | Admitting: Internal Medicine

## 2017-11-21 ENCOUNTER — Encounter: Payer: Self-pay | Admitting: Internal Medicine

## 2017-11-21 ENCOUNTER — Encounter (INDEPENDENT_AMBULATORY_CARE_PROVIDER_SITE_OTHER): Payer: Self-pay

## 2017-11-21 ENCOUNTER — Ambulatory Visit (INDEPENDENT_AMBULATORY_CARE_PROVIDER_SITE_OTHER): Payer: Medicare Other | Admitting: Internal Medicine

## 2017-11-21 VITALS — BP 140/92 | HR 99 | Ht 70.0 in | Wt 250.6 lb

## 2017-11-21 DIAGNOSIS — I481 Persistent atrial fibrillation: Secondary | ICD-10-CM

## 2017-11-21 DIAGNOSIS — I4819 Other persistent atrial fibrillation: Secondary | ICD-10-CM

## 2017-11-21 NOTE — Patient Instructions (Signed)
Medication Instructions: Your physician recommends that you continue on your current medications as directed. Please refer to the Current Medication list given to you today.  Labwork: None Ordered  Procedures/Testing: None Ordered  Follow-Up: Your physician wants you to follow-up in: 1 YEAR with Renee Ursuy, PA-C. You will receive a reminder letter in the mail two months in advance. If you don't receive a letter, please call our office to schedule the follow-up appointment.   If you need a refill on your cardiac medications before your next appointment, please call your pharmacy.    

## 2017-11-21 NOTE — Progress Notes (Signed)
HPI  Warren [name] is a 71 y.o. male Seen in followup for atrial fibrillation in the context of hypertension. He takes flecainide Cardizem and aspirin was discontinued with the initiation of apixoban.  The patient was found to be in atrial fibrillation and saw AS NP. Echocardiogram  12/16 demonstrated normal LV function and discordant data on the left atrium (42/1.9/47)  He has repeatedly declined cardioversion, and A Seiler NP intercurrently discontinued his flecainide area   he has been diagnosed with hypothyroidism and Synthroid has been started. His   CHADS-  2 score is one and his CHADS-VASc score is 2.  No bleeding issues  He denies exercise intolerance edema or fatigue.  His biggest issue is being unable to sleep.  This is been addressed albeit without a great deal of success       Past Medical History:  Diagnosis Date  . Hypertension   . Hypothyroidism   . Increased prostate specific antigen (PSA) velocity   . Morbid obesity (HCC)   . Persistent atrial fibrillation (HCC)   . Sleep-disordered breathing   . Supraventricular tachycardia (HCC)    a. s/p concealed left lateral pathway ablation 2002 Dr Graciela HusbandsKlein    Past Surgical History:  Procedure Laterality Date  . ABLATION  2002   concealed left lateral pathway ablation by Dr Graciela HusbandsKlein  . APPENDECTOMY    . CARDIAC CATHETERIZATION  2001   NO CAD  . NM MYOVIEW LTD     12-09  . TONSILLECTOMY      Current Outpatient Medications  Medication Sig Dispense Refill  . co-enzyme Q-10 30 MG capsule Take 30 mg by mouth daily.     . COD LIVER OIL PO Take 2 tablets by mouth daily.     Marland Kitchen. diltiazem (CARDIZEM CD) 120 MG 24 hr capsule Take 1 capsule (120 mg total) by mouth daily. 90 capsule 2  . ELIQUIS 5 MG TABS tablet take 1 tablet by mouth twice a day 60 tablet 6  . fish oil-omega-3 fatty acids 1000 MG capsule Take 2 g by mouth daily.      . hydrochlorothiazide (HYDRODIURIL) 25 MG tablet take 1 tablet by mouth once daily if  needed 30 tablet 5  . HYDROcodone-acetaminophen (NORCO) 10-325 MG tablet Take 1 tablet by mouth 2 (two) times daily as needed for moderate pain. 180 tablet 0  . levothyroxine (SYNTHROID, LEVOTHROID) 75 MCG tablet Take 1 tablet (75 mcg total) by mouth daily before breakfast. 90 tablet 0  . metoprolol (TOPROL-XL) 200 MG 24 hr tablet Take 1 tablet (200 mg total) by mouth daily. 30 tablet 5  . Multiple Vitamins-Minerals (CENTRUM SILVER PO) Take 1 each by mouth daily.      . nitroGLYCERIN (NITROSTAT) 0.4 MG SL tablet Place 1 tablet (0.4 mg total) under the tongue every 5 (five) minutes as needed for chest pain (x 3 doses). 25 tablet 3  . omeprazole (PRILOSEC) 40 MG capsule Take 1 capsule (40 mg total) by mouth daily. 30 capsule 5  . potassium chloride SA (K-DUR,KLOR-CON) 20 MEQ tablet Take 3 tablets (60 mEq total) by mouth daily. 90 tablet 5  . traZODone (DESYREL) 50 MG tablet Take 1.5 tablets (75 mg total) by mouth at bedtime as needed for sleep. 45 tablet 6  . triamcinolone lotion (KENALOG) 0.1 % Apply 1 application topically 2 (two) times daily. 120 mL 0  . TURMERIC PO Take 4 capsules by mouth 2 (two) times daily.      No current facility-administered medications  for this visit.     No Known Allergies  Review of Systems negative except from HPI and PMH  Physical Exam BP (!) 140/92   Pulse 99   Ht 5\' 10"  (1.778 m)   Wt 250 lb 9.6 oz (113.7 kg)   BMI 35.96 kg/m  Well developed and nourished in no acute distress room smells like a cigar HENT normal Neck supple with JVP-flat Clear Irregularly irregular rate and rhythm, no murmurs or gallops Abd-soft with active BS No Clubbing cyanosis edema Skin-warm and dry A & Oriented  Grossly normal sensory and motor function   ECG demonstrates atrial fib -/09/37  Assessment and  Plan  Permanent  atrial fibrillation  Hypertension  Sleep-disordered breathing  Morbidly obese    On Anticoagulation;  No bleeding issues   Still struggles  with sleep   Exercise tolerance is stable

## 2017-12-04 ENCOUNTER — Other Ambulatory Visit: Payer: Medicare Other

## 2017-12-08 ENCOUNTER — Other Ambulatory Visit: Payer: Self-pay | Admitting: Internal Medicine

## 2017-12-11 ENCOUNTER — Other Ambulatory Visit: Payer: Medicare Other

## 2018-01-01 ENCOUNTER — Other Ambulatory Visit (INDEPENDENT_AMBULATORY_CARE_PROVIDER_SITE_OTHER): Payer: Medicare Other

## 2018-01-01 DIAGNOSIS — E079 Disorder of thyroid, unspecified: Secondary | ICD-10-CM | POA: Diagnosis not present

## 2018-01-01 LAB — TSH: TSH: 3.11 u[IU]/mL (ref 0.35–4.50)

## 2018-01-12 ENCOUNTER — Other Ambulatory Visit: Payer: Self-pay | Admitting: Internal Medicine

## 2018-01-19 ENCOUNTER — Other Ambulatory Visit: Payer: Self-pay | Admitting: Nurse Practitioner

## 2018-01-20 NOTE — Telephone Encounter (Signed)
Age 72 years 11/21/2017  Saw Dr Gar PontoKlein  Wt 113.7kg 11/21/2017 10/16/2017 SrCr 1.40 10/16/2017 Hrg 15.6 HCT 46.5  Refill done for Eliquis 5mg  q 12 hours as requested

## 2018-01-29 ENCOUNTER — Telehealth: Payer: Self-pay | Admitting: Internal Medicine

## 2018-01-29 DIAGNOSIS — G8929 Other chronic pain: Secondary | ICD-10-CM

## 2018-01-29 DIAGNOSIS — M549 Dorsalgia, unspecified: Principal | ICD-10-CM

## 2018-01-29 MED ORDER — HYDROCODONE-ACETAMINOPHEN 10-325 MG PO TABS: 1.0000 | ORAL_TABLET | Freq: Two times a day (BID) | ORAL | 0 refills | Status: DC | PRN

## 2018-01-29 NOTE — Telephone Encounter (Signed)
Hydrocodone refill Last OV: 10/16/17 Last Refill:10/16/17 180 tabs/0 refill [phone]

## 2018-01-29 NOTE — Addendum Note (Signed)
Addended by: Willow OraPAZ, JOSE E on: 01/29/2018 04:35 PM   Modules accepted: Orders

## 2018-01-29 NOTE — Telephone Encounter (Signed)
Copied from CRM [phone]. Topic: Quick Communication - Rx Refill/Question >> Jan 29, 2018  2:28 PM Landry MellowFoltz, Melissa J wrote: Medication: hydrocodone   Has the patient contacted their pharmacy? No.   (Agent: If no, request that the patient contact the pharmacy for the refill.)   Preferred Pharmacy (with phone number or street name): pick up 4 days left  [phone] (M) -    Agent: Please be advised that RX refills may take up to 3 business days. We ask that you follow-up with your pharmacy.

## 2018-01-29 NOTE — Addendum Note (Signed)
Addended byConrad Baileyton: Clifton Safley D on: 01/29/2018 03:33 PM   Modules accepted: Orders

## 2018-01-29 NOTE — Telephone Encounter (Signed)
Pt is requesting refill on hydrocodone.   Last OV: 10/16/2017 Last Fill: 10/16/2017 #180 and 0RF UDS: 07/11/2017 Low risk  NCCR printed- no issues noted  Please advise.

## 2018-01-29 NOTE — Telephone Encounter (Signed)
Sent!

## 2018-03-06 ENCOUNTER — Other Ambulatory Visit: Payer: Self-pay | Admitting: Internal Medicine

## 2018-04-01 ENCOUNTER — Telehealth: Payer: Self-pay | Admitting: *Deleted

## 2018-04-01 NOTE — Telephone Encounter (Signed)
Called pt to reschedule appt on 04/16/18. Called patient and left message to return call.

## 2018-04-16 ENCOUNTER — Ambulatory Visit: Payer: Medicare Other | Admitting: *Deleted

## 2018-04-17 ENCOUNTER — Other Ambulatory Visit: Payer: Self-pay | Admitting: Internal Medicine

## 2018-04-22 ENCOUNTER — Telehealth: Payer: Self-pay | Admitting: Internal Medicine

## 2018-04-22 DIAGNOSIS — M549 Dorsalgia, unspecified: Principal | ICD-10-CM

## 2018-04-22 DIAGNOSIS — G8929 Other chronic pain: Secondary | ICD-10-CM

## 2018-04-22 NOTE — Telephone Encounter (Signed)
Hydrocondone Last OV:10/16/18 Last refill:01/29/18 PCP:Paz Pharmacy: [facility] #[id number] - , [place] - [address] AT [facility] [address] [phone] (Phone) [phone] (Fax

## 2018-04-22 NOTE — Telephone Encounter (Signed)
Copied from CRM [id number]. Topic: Quick Communication - Rx Refill/Question >> Apr 22, 2018  1:45 PM Cipriano Bunker wrote: Medication:   HYDROcodone-acetaminophen (NORCO) 10-325 MG tablet  Has the patient contacted their pharmacy? No. (Agent: If no, request that the patient contact the pharmacy for the refill.) Preferred Pharmacy (with phone number or street name):   [facility] [phone] - [place], [place] - [address]   [id number] Phone: [phone] Fax: [phone]   Agent: Please be advised that RX refills may take up to 3 business days. We ask that you follow-up with your pharmacy.

## 2018-04-23 MED ORDER — HYDROCODONE-ACETAMINOPHEN 10-325 MG PO TABS: 1.0000 | ORAL_TABLET | Freq: Two times a day (BID) | ORAL | 0 refills | Status: DC | PRN

## 2018-04-23 NOTE — Telephone Encounter (Signed)
Per PCP- due for visit. Letter mailed informing to call office to schedule visit.

## 2018-04-23 NOTE — Telephone Encounter (Signed)
Pt requesting refill on hydrocodone.   Last OV: 10/16/2017, no future appt scheduled Last Fill: 01/29/2018 #180 and 0rf (Pt sig: 1 tab bid prn) UDS: 07/11/2017 Low risk   NCCR in media from 01/29/2018- no discrepancies noted  Please advise.

## 2018-04-23 NOTE — Telephone Encounter (Signed)
Sent!

## 2018-04-28 ENCOUNTER — Encounter: Payer: Self-pay | Admitting: Internal Medicine

## 2018-04-28 ENCOUNTER — Ambulatory Visit (INDEPENDENT_AMBULATORY_CARE_PROVIDER_SITE_OTHER): Payer: Medicare Other | Admitting: Internal Medicine

## 2018-04-28 VITALS — BP 134/76 | HR 67 | Temp 98.2°F | Resp 14 | Ht 70.0 in | Wt 259.4 lb

## 2018-04-28 DIAGNOSIS — E079 Disorder of thyroid, unspecified: Secondary | ICD-10-CM

## 2018-04-28 DIAGNOSIS — I10 Essential (primary) hypertension: Secondary | ICD-10-CM

## 2018-04-28 DIAGNOSIS — I4891 Unspecified atrial fibrillation: Secondary | ICD-10-CM

## 2018-04-28 DIAGNOSIS — G4700 Insomnia, unspecified: Secondary | ICD-10-CM | POA: Diagnosis not present

## 2018-04-28 DIAGNOSIS — M5442 Lumbago with sciatica, left side: Secondary | ICD-10-CM

## 2018-04-28 DIAGNOSIS — R739 Hyperglycemia, unspecified: Secondary | ICD-10-CM

## 2018-04-28 DIAGNOSIS — E785 Hyperlipidemia, unspecified: Secondary | ICD-10-CM

## 2018-04-28 DIAGNOSIS — G8929 Other chronic pain: Secondary | ICD-10-CM | POA: Diagnosis not present

## 2018-04-28 DIAGNOSIS — Z79899 Other long term (current) drug therapy: Secondary | ICD-10-CM

## 2018-04-28 NOTE — Progress Notes (Signed)
Subjective:    Patient ID: Warren [name], male    DOB: 1946-12-02, 72 y.o.   MRN: [id number]  DOS:  04/28/2018 Type of visit - description : ROV Interval history: Hypothyroidism: Good compliance with meds. Back pain: Due for a UDS and contract, pain is not as well-controlled as he would like to. Insomnia: Again the patient reports his sleep is very poor, usually sleeps more during the daytime than at nighttime, sleep is broken, when he is not sleeping he feels drowsy. This is an ongoing problem. Still smoking, not ready to quit.   Review of Systems Denies fever chills No chest pain no difficulty breathing No cough or sputum production No diarrhea. Admits to some fatigue.  Past Medical History:  Diagnosis Date  . Hypertension   . Hypothyroidism   . Increased prostate specific antigen (PSA) velocity   . Morbid obesity (HCC)   . Persistent atrial fibrillation (HCC)   . Sleep-disordered breathing   . Supraventricular tachycardia (HCC)    a. s/p concealed left lateral pathway ablation 2002 Dr Graciela Husbands    Past Surgical History:  Procedure Laterality Date  . ABLATION  2002   concealed left lateral pathway ablation by Dr Graciela Husbands  . APPENDECTOMY    . CARDIAC CATHETERIZATION  2001   NO CAD  . NM MYOVIEW LTD     12-09  . TONSILLECTOMY      Social History   Socioeconomic History  . Marital status: Single    Spouse name: Not on file  . Number of children: 0  . Years of education: Not on file  . Highest education level: Not on file  Occupational History  . 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 Financial resource strain: Not on file  . Food insecurity:    Worry: Not on file    Inability: Not on file  . Transportation needs:    Medical: Not on file    Non-medical: Not on file  Tobacco Use  . Smoking status: Current Every Day Smoker    Types: Cigars  . Smokeless tobacco: Never Used  . Tobacco comment: HAS 2  LARGE CIGARS A DAY  Substance and Sexual Activity  . Alcohol use: Yes    Alcohol/week: 0.0 oz    Comment: RARE  . Drug use: No  . Sexual activity: Not on file  Lifestyle  . Physical activity:    Days per week: Not on file    Minutes per session: Not on file  . Stress: Not on file  Relationships  . Social connections:    Talks on phone: Not on file    Gets together: Not on file    Attends religious service: Not on file    Active member of club or organization: Not on file    Attends meetings of clubs or organizations: Not on file    Relationship status: Not on file  . Intimate partner violence:    Fear of current or ex partner: Not on file    Emotionally abused: Not on file    Physically abused: Not on file    Forced sexual activity: Not on file  Other Topics Concern  . Not on file  Social History Narrative   Lives by himself , has no family,  for emergency contact he provided today the number of a  neighbor 336 [phone]  Allergies as of 04/28/2018   No Known Allergies     Medication List        Accurate as of 04/28/18 11:59 PM. Always use your most recent med list.          CENTRUM SILVER PO Take 1 each by mouth daily.   co-enzyme Q-10 30 MG capsule Take 30 mg by mouth daily.   diltiazem 120 MG 24 hr capsule Commonly known as:  CARDIZEM CD Take 1 capsule (120 mg total) by mouth daily.   ELIQUIS 5 MG Tabs tablet Generic drug:  apixaban take 1 tablet by mouth twice a day   hydrochlorothiazide 25 MG tablet Commonly known as:  HYDRODIURIL take 1 tablet by mouth once daily if needed   HYDROcodone-acetaminophen 10-325 MG tablet Commonly known as:  NORCO Take 1 tablet by mouth 2 (two) times daily as needed for moderate pain.   levothyroxine 75 MCG tablet Commonly known as:  SYNTHROID, LEVOTHROID Take 1 tablet (75 mcg total) by mouth daily before breakfast.   metoprolol 200 MG 24 hr tablet Commonly known as:  TOPROL-XL Take 1 tablet (200  mg total) by mouth daily.   nitroGLYCERIN 0.4 MG SL tablet Commonly known as:  NITROSTAT Place 1 tablet (0.4 mg total) under the tongue every 5 (five) minutes as needed for chest pain (x 3 doses).   omeprazole 40 MG capsule Commonly known as:  PRILOSEC Take 1 capsule (40 mg total) by mouth daily.   potassium chloride SA 20 MEQ tablet Commonly known as:  K-DUR,KLOR-CON Take 3 tablets (60 mEq total) by mouth daily.   traZODone 50 MG tablet Commonly known as:  DESYREL Take 1.5 tablets (75 mg total) by mouth at bedtime as needed for sleep.   triamcinolone lotion 0.1 % Commonly known as:  KENALOG Apply 1 application topically 2 (two) times daily.   TURMERIC PO Take 4 capsules by mouth 2 (two) times daily.          Objective:   Physical Exam BP 134/76 (BP Location: Left Arm, Patient Position: Sitting, Cuff Size: Normal)   Pulse 67   Temp 98.2 F (36.8 C) (Oral)   Resp 14   Ht  (1.778 m)   Wt 259 lb 6 oz (117.7 kg)   SpO2 96%   BMI 37.22 kg/m  General:   Well developed, overweight appearing. NAD.  HEENT:  Normocephalic . Face symmetric, atraumatic Lungs:  CTA B Normal respiratory effort, no intercostal retractions, no accessory muscle use. Heart: Irregular,  no murmur.  No pretibial edema bilaterally  Skin: Not pale. Not jaundice Neurologic:  alert & oriented X3.  Speech normal, gait appropriate for age and unassisted Psych--  Cognition and judgment appear intact.  Cooperative with normal attention span and concentration.  Behavior appropriate. No anxious or depressed appearing.      Assessment & Plan:   Assessment   HTN Hypothyroidism SVT: Cardiac cath 2001: No CAD, ablation 2002 Persistent atrial fibrillation -- on Eliquis started 08-2015 Insomnia -- Xanax was dc 2015 d/t requiring increasing doses, was changed to Ativan: didn't work. Was switch to Ambien: He had drowsiness the following day. On trazodone prn H/o  Fatigue Sleep-disordered  suspected OSA: declined to have a sleep study Increased PSA velocity: Consistently declined to see urology Obesity Back pain-- hydrocodone prn, rx by pcp   PLAN: HTN: Seems well controlled on Cardizem, HCTZ, metoprolol, potassium.  Check a BMP Permanent atrial fibrillation: Saw cardiology 10-2017, no changes made.  Continue anticoagulated.  Hypothyroidism: Recheck a TSH Insomnia: We had a long conversation about the problem, he again reports that he is sleeps more during the daytime and nighttime, when she is not sleeping he feels drowsy. Taking trazodone prn, his sleep is broken, we have to tried/fail several medications.  I advised patient  he needs a specialized care, strongly encouraged him to see one of the  sleep specialist in town, he is very reluctant, at the end he agreed that he will call when ready. Diarrhea: See last visit, sx resolved Also, he takes fish oil, recommend to stop it, recent studies showed no benefit. Back pain: See last visit, again declined a PT referral, UDS and contract today, continue hydrocodone twice a day Hyperglycemia: check a A1c. RTC 6 months CPX

## 2018-04-28 NOTE — Progress Notes (Signed)
Pre visit review using our clinic review tool, if applicable. No additional management support is needed unless otherwise documented below in the visit note. 

## 2018-04-28 NOTE — Patient Instructions (Signed)
GO TO THE LAB : Get the blood work     GO TO THE FRONT DESK Schedule your next appointment for a  Physical exam in 6 months   

## 2018-04-29 LAB — LIPID PANEL
CHOLESTEROL: 164 mg/dL (ref 0–200)
HDL: 37.1 mg/dL — ABNORMAL LOW (ref >39.00)
LDL CALC: 112 mg/dL — AB (ref 0–99)
NONHDL: 127.04
Total CHOL/HDL Ratio: 4
Triglycerides: 77 mg/dL (ref 0.0–149.0)
VLDL: 15.4 mg/dL (ref 0.0–40.0)

## 2018-04-29 LAB — BASIC METABOLIC PANEL
BUN: 22 mg/dL (ref 6–23)
CO2: 28 mEq/L (ref 19–32)
CREATININE: 1.42 mg/dL (ref 0.40–1.50)
Calcium: 9.1 mg/dL (ref 8.4–10.5)
Chloride: 101 mEq/L (ref 96–112)
GFR: 51.92 mL/min — AB (ref >60.00)
GLUCOSE: 92 mg/dL (ref 70–99)
POTASSIUM: 4.1 meq/L (ref 3.5–5.1)
Sodium: 139 mEq/L (ref 135–145)

## 2018-04-29 LAB — TSH: TSH: 2.55 u[IU]/mL (ref 0.35–4.50)

## 2018-04-29 LAB — HEMOGLOBIN A1C: HEMOGLOBIN A1C: 5.9 % (ref 4.6–6.5)

## 2018-04-29 NOTE — Assessment & Plan Note (Addendum)
HTN: Seems well controlled on Cardizem, HCTZ, metoprolol, potassium.  Check a BMP Permanent atrial fibrillation: Saw cardiology 10-2017, no changes made.  Continue anticoagulated. Hypothyroidism: Recheck a TSH Insomnia: We had a long conversation about the problem, he again reports that he is sleeps more during the daytime and nighttime, when she is not sleeping he feels drowsy. Taking trazodone prn, his sleep is broken, we have to tried/fail several medications.  I advised patient  he needs a specialized care, strongly encouraged him to see one of the  sleep specialist in town, he is very reluctant, at the end he agreed that he will call when ready. Diarrhea: See last visit, sx resolved Also, he takes fish oil, recommend to stop it, recent studies showed no benefit. Back pain: See last visit, again declined a PT referral, UDS and contract today, continue hydrocodone twice a day. Hyperglycemia: check a A1c. RTC 6 months CPX

## 2018-05-01 ENCOUNTER — Other Ambulatory Visit: Payer: Self-pay | Admitting: Internal Medicine

## 2018-05-01 LAB — PAIN MGMT, PROFILE 8 W/CONF, U
6 Acetylmorphine: NEGATIVE ng/mL (ref <10)
ALPHAHYDROXYMIDAZOLAM: NEGATIVE ng/mL (ref <50)
AMINOCLONAZEPAM: NEGATIVE ng/mL (ref <25)
AMPHETAMINES: NEGATIVE ng/mL (ref <500)
Alcohol Metabolites: NEGATIVE ng/mL (ref <500)
Alphahydroxyalprazolam: NEGATIVE ng/mL (ref <25)
Alphahydroxytriazolam: NEGATIVE ng/mL (ref <50)
BENZODIAZEPINES: NEGATIVE ng/mL (ref <100)
BUPRENORPHINE, URINE: NEGATIVE ng/mL (ref <5)
BUPRENORPHINE: NEGATIVE ng/mL (ref <2)
Cocaine Metabolite: NEGATIVE ng/mL (ref <150)
Codeine: NEGATIVE ng/mL (ref <50)
Creatinine: 319.9 mg/dL
HYDROCODONE: 1662 ng/mL — AB (ref <50)
HYDROMORPHONE: 1559 ng/mL — AB (ref <50)
Hydroxyethylflurazepam: NEGATIVE ng/mL (ref <50)
Lorazepam: NEGATIVE ng/mL (ref <50)
MARIJUANA METABOLITE: NEGATIVE ng/mL (ref <20)
MDMA: NEGATIVE ng/mL (ref <500)
MORPHINE: NEGATIVE ng/mL (ref <50)
Norbuprenorphine: NEGATIVE ng/mL (ref <2)
Nordiazepam: NEGATIVE ng/mL (ref <50)
Norhydrocodone: 1721 ng/mL — ABNORMAL HIGH (ref <50)
OXIDANT: NEGATIVE ug/mL (ref <200)
Opiates: POSITIVE ng/mL — AB (ref <100)
Oxazepam: NEGATIVE ng/mL (ref <50)
Oxycodone: NEGATIVE ng/mL (ref <100)
TEMAZEPAM: NEGATIVE ng/mL (ref <50)
pH: 5.66 (ref 4.5–9.0)

## 2018-06-03 ENCOUNTER — Other Ambulatory Visit: Payer: Self-pay | Admitting: Internal Medicine

## 2018-06-16 ENCOUNTER — Other Ambulatory Visit: Payer: Self-pay | Admitting: Internal Medicine

## 2018-06-18 ENCOUNTER — Other Ambulatory Visit: Payer: Self-pay | Admitting: Internal Medicine

## 2018-07-21 ENCOUNTER — Telehealth: Payer: Self-pay | Admitting: Internal Medicine

## 2018-07-21 DIAGNOSIS — G8929 Other chronic pain: Secondary | ICD-10-CM

## 2018-07-21 DIAGNOSIS — M549 Dorsalgia, unspecified: Principal | ICD-10-CM

## 2018-07-21 NOTE — Telephone Encounter (Signed)
Copied from CRM [id number]. Topic: Quick Communication - Rx Refill/Question >> Jul 21, 2018  2:25 PM Rica KoyanagiWeikart, Barbee CoughMelissa J wrote: Medication: HYDROcodone-acetaminophen (NORCO) 10-325 MG tablet  Has the patient contacted their pharmacy? No. (Agent: If no, request that the patient contact the pharmacy for the refill.) (Agent: If yes, when and what did the pharmacy advise?) controlled substance   Preferred Pharmacy (with phone number or street name): [facility]   Agent: Please be advised that RX refills may take up to 3 business days. We ask that you follow-up with your pharmacy.

## 2018-07-22 MED ORDER — HYDROCODONE-ACETAMINOPHEN 10-325 MG PO TABS: 1.0000 | ORAL_TABLET | Freq: Two times a day (BID) | ORAL | 0 refills | Status: DC | PRN

## 2018-07-22 NOTE — Telephone Encounter (Signed)
Rx refill request: hydrocodone acetaminophen 10-325 mg      Last filled: 04/23/18   LOV: 04/28/18  PCP: Drue NovelPaz  Pharmacy: verified

## 2018-07-22 NOTE — Telephone Encounter (Signed)
Sent!

## 2018-07-22 NOTE — Telephone Encounter (Signed)
Pt is requesting refill on hydrocodone.   Last OV: 04/28/2018 Last Fill: 04/23/2018 #180 and 0RF (Pt sig: 1 tab bid prn) UDS: 04/28/2018 Low risk  NCCR printed- no discrepancies noted- sent for scanning   Please advise.

## 2018-07-24 ENCOUNTER — Other Ambulatory Visit: Payer: Self-pay | Admitting: Internal Medicine

## 2018-08-11 ENCOUNTER — Other Ambulatory Visit: Payer: Self-pay | Admitting: Internal Medicine

## 2018-08-15 ENCOUNTER — Telehealth: Payer: Self-pay

## 2018-08-15 MED ORDER — APIXABAN 5 MG PO TABS: 5.0000 mg | ORAL_TABLET | Freq: Two times a day (BID) | ORAL | 5 refills | Status: DC

## 2018-08-15 NOTE — Telephone Encounter (Signed)
Refill sent in

## 2018-08-15 NOTE — Telephone Encounter (Signed)
° ° ° ° ° °  1.  Are you calling in regards to an appointment? NO  2.  Are you calling for a refill ? YES, [facility] [phone]#[id number] - [place], [place] - [address]  3.  Are you having bleeding issues? NO  4.  Do you need clearance to hold Coumadin? NO   Please route to the Coumadin Clinic Pool

## 2018-09-11 ENCOUNTER — Other Ambulatory Visit: Payer: Self-pay | Admitting: Internal Medicine

## 2018-10-20 ENCOUNTER — Other Ambulatory Visit: Payer: Self-pay | Admitting: Internal Medicine

## 2018-10-20 DIAGNOSIS — G8929 Other chronic pain: Secondary | ICD-10-CM

## 2018-10-20 DIAGNOSIS — M549 Dorsalgia, unspecified: Principal | ICD-10-CM

## 2018-10-20 MED ORDER — HYDROCODONE-ACETAMINOPHEN 10-325 MG PO TABS: 1.0000 | ORAL_TABLET | Freq: Two times a day (BID) | ORAL | 0 refills | Status: DC | PRN

## 2018-10-20 NOTE — Telephone Encounter (Signed)
Copied from CRM [id number]. Topic: Quick Communication - Rx Refill/Question >> Oct 20, 2018 12:07 PM Crist Infante wrote: Medication: HYDROcodone-acetaminophen (NORCO) 10-325 MG tablet  [facility] [phone] - [place], [place] - [address] AT [facility] [address] [phone] (Phone) [phone] (Fax)

## 2018-10-20 NOTE — Telephone Encounter (Signed)
Sent!

## 2018-10-20 NOTE — Telephone Encounter (Signed)
Pt is requesting refill on hydrocodone.   Last OV: 04/28/2018 Last Fill: 07/22/2018 #180 and 0RF UDS: 04/28/2018 Low risk  NCCR printed- no discrepancies noted- sent for scanning

## 2018-10-26 ENCOUNTER — Other Ambulatory Visit: Payer: Self-pay | Admitting: Internal Medicine

## 2018-11-13 ENCOUNTER — Encounter (HOSPITAL_COMMUNITY): Payer: Self-pay | Admitting: Emergency Medicine

## 2018-11-13 ENCOUNTER — Emergency Department (HOSPITAL_COMMUNITY): Payer: Medicare Other

## 2018-11-13 ENCOUNTER — Emergency Department (HOSPITAL_COMMUNITY)
Admission: EM | Admit: 2018-11-13 | Discharge: 2018-11-13 | Disposition: A | Discharge status: Home / Self Care | Payer: Medicare Other | Attending: Emergency Medicine | Admitting: Emergency Medicine

## 2018-11-13 ENCOUNTER — Other Ambulatory Visit: Payer: Self-pay

## 2018-11-13 DIAGNOSIS — F17210 Nicotine dependence, cigarettes, uncomplicated: Secondary | ICD-10-CM | POA: Diagnosis not present

## 2018-11-13 DIAGNOSIS — R2231 Localized swelling, mass and lump, right upper limb: Secondary | ICD-10-CM | POA: Diagnosis present

## 2018-11-13 DIAGNOSIS — E039 Hypothyroidism, unspecified: Secondary | ICD-10-CM | POA: Diagnosis not present

## 2018-11-13 DIAGNOSIS — Z79899 Other long term (current) drug therapy: Secondary | ICD-10-CM | POA: Diagnosis not present

## 2018-11-13 DIAGNOSIS — S60221A Contusion of right hand, initial encounter: Secondary | ICD-10-CM | POA: Diagnosis not present

## 2018-11-13 DIAGNOSIS — I10 Essential (primary) hypertension: Secondary | ICD-10-CM | POA: Diagnosis not present

## 2018-11-13 DIAGNOSIS — Y939 Activity, unspecified: Secondary | ICD-10-CM | POA: Diagnosis not present

## 2018-11-13 DIAGNOSIS — Y929 Unspecified place or not applicable: Secondary | ICD-10-CM | POA: Insufficient documentation

## 2018-11-13 DIAGNOSIS — X58XXXA Exposure to other specified factors, initial encounter: Secondary | ICD-10-CM | POA: Insufficient documentation

## 2018-11-13 DIAGNOSIS — Y999 Unspecified external cause status: Secondary | ICD-10-CM | POA: Diagnosis not present

## 2018-11-13 DIAGNOSIS — Z7901 Long term (current) use of anticoagulants: Secondary | ICD-10-CM | POA: Diagnosis not present

## 2018-11-13 NOTE — Discharge Instructions (Addendum)
Elevate hand.  Ice pack.  Use your Ace wrap.  Expect your hand to become black and blue.  Return for any concerns, especially lack of sensation or movement in your hand.

## 2018-11-13 NOTE — ED Triage Notes (Signed)
Pt reported he began having sudden onset right hand swelling. Pt is on eliquis. Pt denies injury to the area. Pt has adequate pulses and cap refill. Pt has a watch on affected extremity but is unable to take it off. He does not want staff to cut the watch off.

## 2018-11-14 ENCOUNTER — Encounter (HOSPITAL_BASED_OUTPATIENT_CLINIC_OR_DEPARTMENT_OTHER): Payer: Self-pay

## 2018-11-14 ENCOUNTER — Other Ambulatory Visit: Payer: Self-pay

## 2018-11-14 ENCOUNTER — Emergency Department (HOSPITAL_BASED_OUTPATIENT_CLINIC_OR_DEPARTMENT_OTHER)
Admission: EM | Admit: 2018-11-14 | Discharge: 2018-11-15 | Disposition: A | Discharge status: Home / Self Care | Payer: Medicare Other | Attending: Emergency Medicine | Admitting: Emergency Medicine

## 2018-11-14 DIAGNOSIS — E039 Hypothyroidism, unspecified: Secondary | ICD-10-CM | POA: Diagnosis not present

## 2018-11-14 DIAGNOSIS — Z7901 Long term (current) use of anticoagulants: Secondary | ICD-10-CM | POA: Insufficient documentation

## 2018-11-14 DIAGNOSIS — F17290 Nicotine dependence, other tobacco product, uncomplicated: Secondary | ICD-10-CM | POA: Insufficient documentation

## 2018-11-14 DIAGNOSIS — I10 Essential (primary) hypertension: Secondary | ICD-10-CM | POA: Insufficient documentation

## 2018-11-14 DIAGNOSIS — R238 Other skin changes: Secondary | ICD-10-CM | POA: Insufficient documentation

## 2018-11-14 DIAGNOSIS — X58XXXD Exposure to other specified factors, subsequent encounter: Secondary | ICD-10-CM | POA: Diagnosis not present

## 2018-11-14 DIAGNOSIS — Z79899 Other long term (current) drug therapy: Secondary | ICD-10-CM | POA: Diagnosis not present

## 2018-11-14 DIAGNOSIS — S60221D Contusion of right hand, subsequent encounter: Secondary | ICD-10-CM | POA: Insufficient documentation

## 2018-11-14 NOTE — ED Provider Notes (Signed)
[facility]-EMERGENCY DEPT Provider Note   CSN: [id number] Arrival date & time: 11/13/18  1932     History   Chief Complaint Chief Complaint  Patient presents with  . hand swelling    HPI Warren [name] is a 72 y.o. male.  Level 5 caveat for acuity of condition.  Patient presents with abrupt swelling of the dorsum of his right hand earlier this evening.  He is on Eliquis.  He may have bumped his hand on a firm object.  No other known injuries.  I was called in the room emergently by the RN to assess his hand.  He is able to move his hand and wiggle his fingers.  Good radial pulse.     Past Medical History:  Diagnosis Date  . Hypertension   . Hypothyroidism   . Increased prostate specific antigen (PSA) velocity   . Morbid obesity (HCC)   . Persistent atrial fibrillation   . Sleep-disordered breathing   . Supraventricular tachycardia (HCC)    a. s/p concealed left lateral pathway ablation 2002 Dr Graciela Husbands    Patient Active Problem List   Diagnosis Date Noted  . PCP NOTES >>>>> 10/31/2015  . Annual physical exam 01/29/2012  . OBESITY 01/19/2010  . TOBACCO ABUSE 01/19/2010  . Dyslipidemia 04/12/2009  . Disorder of thyroid 03/23/2009  . ATRIAL FIBRILLATION 03/23/2009  . Chronic back pain 01/11/2009  . Insomnia 02/03/2008  . PSA, INCREASED 06/12/2007  . HTN (hypertension) 05/23/2007    Past Surgical History:  Procedure Laterality Date  . ABLATION  2002   concealed left lateral pathway ablation by Dr Graciela Husbands  . APPENDECTOMY    . CARDIAC CATHETERIZATION  2001   NO CAD  . NM MYOVIEW LTD     12-09  . TONSILLECTOMY          Home Medications    Prior to Admission medications   Medication Sig Start Date End Date Taking? Authorizing Provider  apixaban (ELIQUIS) 5 MG TABS tablet Take 1 tablet (5 mg total) by mouth 2 (two) times daily. 08/15/18  Yes Duke Salvia, MD  co-enzyme Q-10 30 MG capsule Take 30 mg by mouth daily.    Yes [provider]  diltiazem (CARTIA XT) 120 MG 24 hr capsule Take 1 capsule (120 mg total) by mouth daily. 10/27/18  Yes Paz, Nolon Rod, MD  hydrochlorothiazide (HYDRODIURIL) 25 MG tablet take 1 tablet by mouth once daily if needed 06/03/18  Yes Paz, Nolon Rod, MD  HYDROcodone-acetaminophen [facility]) 10-325 MG tablet Take 1 tablet by mouth 2 (two) times daily as needed for moderate pain. 10/20/18  Yes Paz, Nolon Rod, MD  levothyroxine (SYNTHROID, LEVOTHROID) 75 MCG tablet Take 1 tablet (75 mcg total) by mouth daily before breakfast. 07/24/18  Yes Wanda Plump, MD  metoprolol (TOPROL-XL) 200 MG 24 hr tablet Take 1 tablet (200 mg total) by mouth daily. 06/18/18  Yes Paz, Nolon Rod, MD  Multiple Vitamins-Minerals (CENTRUM SILVER PO) Take 1 each by mouth daily.     Yes [provider]  nitroGLYCERIN (NITROSTAT) 0.4 MG SL tablet Place 1 tablet (0.4 mg total) under the tongue every 5 (five) minutes as needed for chest pain (x 3 doses). 02/08/16  Yes Seiler, Amber K, NP  omeprazole (PRILOSEC) 40 MG capsule Take 1 capsule (40 mg total) by mouth daily. 06/16/18  Yes Paz, Nolon Rod, MD  potassium chloride SA (K-DUR,KLOR-CON) 20 MEQ tablet Take 3 tablets (60 mEq total) by mouth daily. 06/03/18  Yes Wanda Plump, MD  traZODone (DESYREL) 50 MG tablet Take 1.5 tablets (75 mg total) by mouth at bedtime as needed for sleep. 09/11/18  Yes Paz, Nolon Rod, MD  TURMERIC PO Take 4 capsules by mouth 2 (two) times daily.    Yes [provider]  triamcinolone lotion (KENALOG) 0.1 % Apply 1 application topically 2 (two) times daily. Patient not taking: Reported on 04/28/2018 07/24/16   Wanda Plump, MD    Family History Family History  Adopted: Yes  Family history unknown: Yes    Social History Social History   Tobacco Use  . Smoking status: Current Every Day Smoker    Types: Cigars  . Smokeless tobacco: Never Used  . Tobacco comment: HAS 2 LARGE CIGARS A DAY  Substance Use Topics  . Alcohol use: Yes    Alcohol/week: 0.0  standard drinks    Comment: RARE  . Drug use: No     Allergies   Patient has no known allergies.   Review of Systems Review of Systems  Unable to perform ROS: Acuity of condition     Physical Exam Updated Vital Signs BP (!) 140/103   Pulse 63   Temp 97.6 F (36.4 C) (Oral)   Resp 19   Ht 5\' 10"  (1.778 m)   Wt 116.1 kg   SpO2 96%   BMI 36.73 kg/m   Physical Exam  Constitutional: He is oriented to person, place, and time. He appears well-developed and well-nourished.  HENT:  Head: Normocephalic and atraumatic.  Eyes: Conjunctivae are normal.  Neck: Neck supple.  Cardiovascular: Normal rate and regular rhythm.  Pulmonary/Chest: Effort normal and breath sounds normal.  Abdominal: Soft. Bowel sounds are normal.  Musculoskeletal: Normal range of motion.  Right hand: Significant ecchymosis both in the dorsal and volar aspect of the hand.  No neurovascular compromise.  Patient is able to move his digits.  Neurological: He is alert and oriented to person, place, and time.  Skin: Skin is warm and dry.  Psychiatric: He has a normal mood and affect. His behavior is normal.  Nursing note and vitals reviewed.    ED Treatments / Results  Labs (all labs ordered are listed, but only abnormal results are displayed) Labs Reviewed - No data to display  EKG None  Radiology Dg Hand Complete Right  Result Date: 11/13/2018 CLINICAL DATA:  Rapid swelling of the RIGHT extremity. EXAM: RIGHT HAND - COMPLETE 3+ VIEW COMPARISON:  None. FINDINGS: No evidence of fracture of the carpal or metacarpal bones. Radiocarpal joint is intact. Phalanges are normal. Soft tissue swelling over the dorsum of the hand. IMPRESSION: Soft tissue swelling.  No osseous abnormality. Electronically Signed   By: Genevive Bi M.D.   On: 11/13/2018 20:42    Procedures Procedures (including critical care time)  Medications Ordered in ED Medications - No data to display   Initial Impression /  Assessment and Plan / ED Course  I have reviewed the triage vital signs and the nursing notes.  Pertinent labs & imaging results that were available during my care of the patient were reviewed by me and considered in my medical decision making (see chart for details).     Patient presents with swelling in the dorsal and volar aspect of his right hand.  I suspect this is a hematoma made worse by his Eliquis Rx.  Ace bandage was applied and hand was iced.  Patient was observed for 2 hours with a reduction in his swelling.  Neurovascular intact at discharge.  He has been instructed to hold his Eliquis for 1 day, elevate his hand, apply ice, return if worse.  Final Clinical Impressions(s) / ED Diagnoses   Final diagnoses:  Traumatic ecchymosis of right hand, initial encounter    ED Discharge Orders    None       Donnetta Hutchingook, Brandy Kabat, MD 11/14/18 2359

## 2018-11-14 NOTE — ED Triage Notes (Signed)
Pt injured right hand yesterday-[facility]-ace wrap in place-here tonight due to hand had large blister/bruising-NAD-steady gait

## 2018-11-15 NOTE — ED Provider Notes (Signed)
[facility] Provider Note: [name] Handsome Anglin, MD, FACEP  CSN: [id number] MRN: [id number] ARRIVAL: 11/14/18 at 2112 ROOM: MH07/MH07   CHIEF COMPLAINT  Hand Injury   HISTORY OF PRESENT ILLNESS  11/15/18 12:35 AM Warren [name] is a 72 y.o. male who [facility] for a hematoma of the dorsum of his right hand.  He is on Eliquis.  He was not sure if he injured the hand.  Radiographs showed no fracture.  He returns for reevaluation due to the development of blisters on his right hand. He is still able to move all fingers of his right hand.  He has minimal pain at rest and some mild to moderate pain with movement or palpation.   Past Medical History:  Diagnosis Date  . Hypertension   . Hypothyroidism   . Increased prostate specific antigen (PSA) velocity   . Morbid obesity (HCC)   . Persistent atrial fibrillation   . Sleep-disordered breathing   . Supraventricular tachycardia (HCC)    a. s/p concealed left lateral pathway ablation 2002 Dr Graciela HusbandsKlein    Past Surgical History:  Procedure Laterality Date  . ABLATION  2002   concealed left lateral pathway ablation by Dr Graciela HusbandsKlein  . APPENDECTOMY    . CARDIAC CATHETERIZATION  2001   NO CAD  . NM MYOVIEW LTD     12-09  . TONSILLECTOMY      Family History  Adopted: Yes  Family history unknown: Yes    Social History   Tobacco Use  . Smoking status: Current Every Day Smoker    Types: Cigars  . Smokeless tobacco: Never Used  . Tobacco comment: HAS 2 LARGE CIGARS A DAY  Substance Use Topics  . Alcohol use: Yes    Alcohol/week: 0.0 standard drinks    Comment: RARE  . Drug use: No    Prior to Admission medications   Medication Sig Start Date End Date Taking? Authorizing Provider  apixaban (ELIQUIS) 5 MG TABS tablet Take 1 tablet (5 mg total) by mouth 2 (two) times daily. 08/15/18   Duke SalviaKlein, Steven C, MD  co-enzyme Q-10 30 MG capsule Take 30 mg by mouth daily.     [provider]  diltiazem  (CARTIA XT) 120 MG 24 hr capsule Take 1 capsule (120 mg total) by mouth daily. 10/27/18   Wanda PlumpPaz, Jose E, MD  hydrochlorothiazide (HYDRODIURIL) 25 MG tablet take 1 tablet by mouth once daily if needed 06/03/18   Wanda PlumpPaz, Jose E, MD  HYDROcodone-acetaminophen [facility](NORCO) 10-325 MG tablet Take 1 tablet by mouth 2 (two) times daily as needed for moderate pain. 10/20/18   Wanda PlumpPaz, Jose E, MD  levothyroxine (SYNTHROID, LEVOTHROID) 75 MCG tablet Take 1 tablet (75 mcg total) by mouth daily before breakfast. 07/24/18   Wanda PlumpPaz, Jose E, MD  metoprolol (TOPROL-XL) 200 MG 24 hr tablet Take 1 tablet (200 mg total) by mouth daily. 06/18/18   Wanda PlumpPaz, Jose E, MD  Multiple Vitamins-Minerals (CENTRUM SILVER PO) Take 1 each by mouth daily.      [provider]  nitroGLYCERIN (NITROSTAT) 0.4 MG SL tablet Place 1 tablet (0.4 mg total) under the tongue every 5 (five) minutes as needed for chest pain (x 3 doses). 02/08/16   Marily LenteSeiler, Amber K, NP  omeprazole (PRILOSEC) 40 MG capsule Take 1 capsule (40 mg total) by mouth daily. 06/16/18   Wanda PlumpPaz, Jose E, MD  potassium chloride SA (K-DUR,KLOR-CON) 20 MEQ tablet Take 3 tablets (60 mEq total) by  mouth daily. 06/03/18   Wanda Plump, MD  traZODone (DESYREL) 50 MG tablet Take 1.5 tablets (75 mg total) by mouth at bedtime as needed for sleep. 09/11/18   Wanda Plump, MD  triamcinolone lotion (KENALOG) 0.1 % Apply 1 application topically 2 (two) times daily. Patient not taking: Reported on 04/28/2018 07/24/16   Wanda Plump, MD  TURMERIC PO Take 4 capsules by mouth 2 (two) times daily.     [provider]    Allergies Patient has no known allergies.   REVIEW OF SYSTEMS  Negative except as noted here or in the History of Present Illness.   PHYSICAL EXAMINATION  Initial Vital Signs Blood pressure (!) 139/96, pulse 99, temperature 98.4 F (36.9 C), temperature source Oral, resp. rate 18, height 5\' 9"  (1.753 m), weight 119.7 kg, SpO2 97 %.  Examination General: Well-developed, well-nourished male  in no acute distress; appearance consistent with age of record HENT: normocephalic; atraumatic Eyes: Normal appearance Neck: supple Heart: regular rate and rhythm Lungs: clear to auscultation bilaterally Abdomen: soft; nondistended Extremities: No deformity; swelling and ecchymosis of right hand with multiple fluid-filled blisters, tendon function intact in all digits and capillary refill is brisk in all digits, sensation is intact over the entire hand:      Neurologic: Awake, alert and oriented; motor function intact in all extremities and symmetric; no facial droop Skin: Warm and dry Psychiatric: Normal mood and affect   RESULTS  Summary of this visit's results, reviewed by myself:   EKG Interpretation  Date/Time:    Ventricular Rate:    PR Interval:    QRS Duration:   QT Interval:    QTC Calculation:   R Axis:     Text Interpretation:        Laboratory Studies: No results found for this or any previous visit (from the past 24 hour(s)). Imaging Studies: Dg Hand Complete Right  Result Date: 11/13/2018 CLINICAL DATA:  Rapid swelling of the RIGHT extremity. EXAM: RIGHT HAND - COMPLETE 3+ VIEW COMPARISON:  None. FINDINGS: No evidence of fracture of the carpal or metacarpal bones. Radiocarpal joint is intact. Phalanges are normal. Soft tissue swelling over the dorsum of the hand. IMPRESSION: Soft tissue swelling.  No osseous abnormality. Electronically Signed   By: Genevive Bi M.D.   On: 11/13/2018 20:42    ED COURSE and MDM  Nursing notes and initial vitals signs, including pulse oximetry, reviewed.  Vitals:   11/14/18 2124 11/15/18 0043  BP: (!) 139/96 (!) 137/91  Pulse: 99 (!) 55  Resp: 18 20  Temp: 98.4 F (36.9 C)   TempSrc: Oral   SpO2: 97% 100%  Weight: 119.7 kg   Height: 5\' 9"  (1.753 m)    There is no evidence of compartment syndrome on exam.  Soft tissue is swollen and ecchymotic but not tense.  There is brisk capillary refill distally and minimal  pain.  The blisters are likely similar to fracture blisters.   PROCEDURES   The patient's blisters were drained using a sterile 18-gauge needle.  The patient tolerated this well and there were no immediate complications.  The patient's hand was dressed with antibiotic ointment.  ED DIAGNOSES     ICD-10-CM   1. Traumatic hematoma of right hand, subsequent encounter S60.221D   2. Bullae R23.8        Theus Espin, Jonny Ruiz, MD 11/15/18 [phone]

## 2018-11-17 ENCOUNTER — Telehealth: Payer: Self-pay

## 2018-11-17 NOTE — Telephone Encounter (Signed)
Called pt left msg to call and schd apt.

## 2018-11-17 NOTE — Telephone Encounter (Signed)
Needs ED f/u- please schedule.  

## 2018-11-28 ENCOUNTER — Encounter: Payer: Self-pay | Admitting: Internal Medicine

## 2018-11-28 ENCOUNTER — Ambulatory Visit (INDEPENDENT_AMBULATORY_CARE_PROVIDER_SITE_OTHER): Payer: Medicare Other | Admitting: Internal Medicine

## 2018-11-28 VITALS — BP 132/68 | HR 68 | Temp 98.2°F | Resp 16 | Ht 70.0 in | Wt 261.1 lb

## 2018-11-28 DIAGNOSIS — Z23 Encounter for immunization: Secondary | ICD-10-CM

## 2018-11-28 DIAGNOSIS — R233 Spontaneous ecchymoses: Secondary | ICD-10-CM

## 2018-11-28 DIAGNOSIS — S60221D Contusion of right hand, subsequent encounter: Secondary | ICD-10-CM

## 2018-11-28 NOTE — Patient Instructions (Addendum)
  Come back in 4 weeks for a physical exam Keep the area clean, dry and protected. If you have bleeding from the hematoma , apply pressure and  go to the ER.

## 2018-11-28 NOTE — Progress Notes (Signed)
Subjective:    Patient ID: Warren [name], male    DOB: 1946/06/15, 72 y.o.   MRN: [id number]  DOS:  11/28/2018 Type of visit - description : ER f/u  Went to the ER twice 11/13/2018: Was seen for acute swelling of the right hand with no known injury, on Eliquis. X-ray: No bone abnormalities Neurovascularly was intact and was released home. Went to the ER the next day, see pictures from that visit, he also has some blisters, was not felt to have a compartmental syndrome and was released home.   Review of Systems Since the ER visit, he seems better, the swelling on the dorsum of the hand continue but the fingers are less swollen. No finger paresthesias, finger range of motion improved. He has developed some ecchymosis in the arm. He again reports no  injury triggering this problem. Denies fever chills has seen no discharge coming out from the hand. Past Medical History:  Diagnosis Date  . Hypertension   . Hypothyroidism   . Increased prostate specific antigen (PSA) velocity   . Morbid obesity (HCC)   . Persistent atrial fibrillation   . Sleep-disordered breathing   . Supraventricular tachycardia (HCC)    a. s/p concealed left lateral pathway ablation 2002 Dr Graciela Husbands    Past Surgical History:  Procedure Laterality Date  . ABLATION  2002   concealed left lateral pathway ablation by Dr Graciela Husbands  . APPENDECTOMY    . CARDIAC CATHETERIZATION  2001   NO CAD  . NM MYOVIEW LTD     12-09  . TONSILLECTOMY      Social History   Socioeconomic History  . Marital status: Single    Spouse name: Not on file  . Number of children: 0  . Years of education: Not on file  . Highest education level: Not on file  Occupational History  . 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 Financial resource strain: Not on file  . Food insecurity:    Worry: Not on file    Inability: Not on file  . Transportation needs:    Medical: Not on  file    Non-medical: Not on file  Tobacco Use  . Smoking status: Current Every Day Smoker    Types: Cigars  . Smokeless tobacco: Never Used  . Tobacco comment: HAS 2 LARGE CIGARS A DAY  Substance and Sexual Activity  . Alcohol use: Yes    Alcohol/week: 0.0 standard drinks    Comment: RARE  . Drug use: No  . Sexual activity: Not on file  Lifestyle  . Physical activity:    Days per week: Not on file    Minutes per session: Not on file  . Stress: Not on file  Relationships  . Social connections:    Talks on phone: Not on file    Gets together: Not on file    Attends religious service: Not on file    Active member of club or organization: Not on file    Attends meetings of clubs or organizations: Not on file    Relationship status: Not on file  . Intimate partner violence:    Fear of current or ex partner: Not on file    Emotionally abused: Not on file    Physically abused: Not on file    Forced sexual activity: Not on file  Other Topics Concern  . Not on file  Social History Narrative  Lives by himself , has no family,  for emergency contact he provided today the number of a  neighbor [phone]                 Allergies as of 11/28/2018   No Known Allergies     Medication List        Accurate as of 11/28/18 11:59 PM. Always use your most recent med list.          apixaban 5 MG Tabs tablet Commonly known as:  ELIQUIS Take 1 tablet (5 mg total) by mouth 2 (two) times daily.   CENTRUM SILVER PO Take 1 each by mouth daily.   co-enzyme Q-10 30 MG capsule Take 30 mg by mouth daily.   diltiazem 120 MG 24 hr capsule Commonly known as:  CARDIZEM CD Take 1 capsule (120 mg total) by mouth daily.   hydrochlorothiazide 25 MG tablet Commonly known as:  HYDRODIURIL take 1 tablet by mouth once daily if needed   HYDROcodone-acetaminophen 10-325 MG tablet Commonly known as:  NORCO Take 1 tablet by mouth 2 (two) times daily as needed for moderate pain.     levothyroxine 75 MCG tablet Commonly known as:  SYNTHROID, LEVOTHROID Take 1 tablet (75 mcg total) by mouth daily before breakfast.   metoprolol 200 MG 24 hr tablet Commonly known as:  TOPROL-XL Take 1 tablet (200 mg total) by mouth daily.   nitroGLYCERIN 0.4 MG SL tablet Commonly known as:  NITROSTAT Place 1 tablet (0.4 mg total) under the tongue every 5 (five) minutes as needed for chest pain (x 3 doses).   omeprazole 40 MG capsule Commonly known as:  PRILOSEC Take 1 capsule (40 mg total) by mouth daily.   potassium chloride SA 20 MEQ tablet Commonly known as:  K-DUR,KLOR-CON Take 3 tablets (60 mEq total) by mouth daily.   traZODone 50 MG tablet Commonly known as:  DESYREL Take 1.5 tablets (75 mg total) by mouth at bedtime as needed for sleep.   TURMERIC PO Take 4 capsules by mouth 2 (two) times daily.           Objective:   Physical Exam BP 132/68 (BP Location: Left Arm, Patient Position: Sitting, Cuff Size: Normal)   Pulse 68   Temp 98.2 F (36.8 C) (Oral)   Resp 16   Ht 5\' 10"  (1.778 m)   Wt 261 lb 2 oz (118.4 kg)   SpO2 96%   BMI 37.47 kg/m  General:   Well developed, NAD, BMI noted. HEENT:  Normocephalic . Face symmetric, atraumatic Upper extremities: See pictures Left arm normal Right arm: Fingers soft, nontender, full range of motion Dorsum of the right hand has a large, very soft mass c/w hematoma. Good radial pulse. Skin: Not pale. Not jaundice Neurologic:  alert & oriented X3.  Speech normal, gait appropriate for age and unassisted Psych--  Cognition and judgment appear intact.  Cooperative with normal attention span and concentration.  Behavior appropriate. No anxious or depressed appearing.                Assessment & Plan:    Assessment   HTN Hypothyroidism SVT: Cardiac cath 2001: No CAD, ablation 2002 Persistent atrial fibrillation -- on Eliquis started 08-2015 Insomnia -- Xanax was dc 2015 d/t requiring increasing  doses, was changed to Ativan: didn't work. Was switch to Ambien: He had drowsiness the following day. On trazodone prn H/o  Fatigue Sleep-disordered suspected OSA: declined to have a sleep study Increased  PSA velocity: Consistently declined to see urology Obesity Back pain-- hydrocodone prn, rx by pcp   PLAN: Hematoma: Unclear etiology, no history of injury, fortunately he does not seem to be getting infection.  For now recommend observation, likely will slowly re-absorb  if the hematoma opens up he needs to call or go to the ER. Dressing was re done by my nurse (vaseline patch and gauze) Preventive care: Flu and tetanus shot today. RTC 4 weeks CPX

## 2018-11-28 NOTE — Progress Notes (Signed)
Pre visit review using our clinic review tool, if applicable. No additional management support is needed unless otherwise documented below in the visit note. 

## 2018-11-30 ENCOUNTER — Other Ambulatory Visit: Payer: Self-pay | Admitting: Internal Medicine

## 2018-11-30 NOTE — Assessment & Plan Note (Addendum)
Hematoma: Unclear etiology, no history of injury, fortunately he does not seem to be getting infection.  For now recommend observation, likely will slowly re-absorb  if the hematoma opens up he needs to call or go to the ER. Dressing was re done by my nurse (vaseline patch and gauze) Preventive care: Flu and tetanus shot today. RTC 4 weeks CPX

## 2019-01-07 ENCOUNTER — Telehealth: Payer: Self-pay | Admitting: Internal Medicine

## 2019-01-07 NOTE — Telephone Encounter (Signed)
New Message   Pt c/o medication issue:  1. Name of Medication: apixaban (ELIQUIS) 5 MG TABS tablet  2. How are you currently taking this medication (dosage and times per day)?  Take 1 tablet (5 mg total) by mouth 2 (two) times daily.  3. Are you having a reaction (difficulty breathing--STAT)?  4. What is your medication issue? Patient is calling because Eliquis has been removed from medicare part D. He states that it now will cost about $500+ dollars. He can not afford that. Please call to discuss.

## 2019-01-07 NOTE — Telephone Encounter (Signed)
Spoke to the patient who is calling because his Eliquis is now going to cost him $ 572 out of pocket.  He would like some assistance with this, either change medication or financial assistance.  Please advise, thank you.

## 2019-01-08 MED ORDER — APIXABAN 5 MG PO TABS: 5.0000 mg | ORAL_TABLET | Freq: Two times a day (BID) | ORAL | 3 refills | Status: DC

## 2019-01-08 NOTE — Telephone Encounter (Signed)
Pt was able to negotiate a price with his insurance company if he were to use [facility]. A 90 day refill has been sent in. Pt understands he needs to keep his appt for 2/6 to receive more refills.   2.5mg  Eliquis samples have been left at the front desk for pt as well. He understands he should take two 2.5mg  tablets two times per day. This is meant to anticoagulate patient until his script arrives in the mail in the next 10 days.

## 2019-01-08 NOTE — Telephone Encounter (Signed)
Spoke with pt who would like an alternative to Eliquis since the cost has gone up and he can no longer afford it. Pt was given the names of Xarelto and Pradaxa. He will call his insurance company to figure out which one will be affordable. He will call me back. We will have labs drawn and samples given depending on which one he prefers. I will discuss with Dr Graciela Husbands, as well, when he returns to the office.

## 2019-01-08 NOTE — Telephone Encounter (Signed)
Follow up  ° ° °Pt is returning call  ° ° °Please call back  °

## 2019-01-08 NOTE — Telephone Encounter (Signed)
LVM for return call. 

## 2019-01-10 ENCOUNTER — Other Ambulatory Visit: Payer: Self-pay | Admitting: Internal Medicine

## 2019-01-14 NOTE — Telephone Encounter (Signed)
Noted  

## 2019-01-15 ENCOUNTER — Telehealth: Payer: Self-pay | Admitting: Internal Medicine

## 2019-01-15 DIAGNOSIS — M549 Dorsalgia, unspecified: Principal | ICD-10-CM

## 2019-01-15 DIAGNOSIS — G8929 Other chronic pain: Secondary | ICD-10-CM

## 2019-01-15 NOTE — Telephone Encounter (Signed)
Copied from CRM [id number]. Topic: Quick Communication - Rx Refill/Question >> Jan 15, 2019  3:58 PM Jaquita Rector A wrote: Medication: HYDROcodone-acetaminophen (NORCO) 10-325 MG tablet   Has the patient contacted their pharmacy? Yes.   (Agent: If no, request that the patient contact the pharmacy for the refill.) (Agent: If yes, when and what did the pharmacy advise?)  Preferred Pharmacy (with phone number or street name): [facility] [phone] - [place], [place] - [address] [phone] (Phone) [phone] (Fax)    Agent: Please be advised that RX refills may take up to 3 business days. We ask that you follow-up with your pharmacy.

## 2019-01-16 MED ORDER — HYDROCODONE-ACETAMINOPHEN 10-325 MG PO TABS: 1.0000 | ORAL_TABLET | Freq: Two times a day (BID) | ORAL | 0 refills | Status: DC | PRN

## 2019-01-16 NOTE — Telephone Encounter (Signed)
Pt is requesting refill on hydrocodone.   Last OV: 11/28/2018 Last Fill: 10/20/2018 #180 and 0RF UDS: 04/28/2018 Low risk  NCCR in media 10/20/2018

## 2019-01-16 NOTE — Telephone Encounter (Signed)
Sent!

## 2019-01-29 ENCOUNTER — Encounter: Payer: Self-pay | Admitting: Internal Medicine

## 2019-01-29 ENCOUNTER — Ambulatory Visit (INDEPENDENT_AMBULATORY_CARE_PROVIDER_SITE_OTHER): Payer: Medicare Other | Admitting: Internal Medicine

## 2019-01-29 VITALS — BP 136/82 | HR 74 | Ht 70.0 in | Wt 258.6 lb

## 2019-01-29 DIAGNOSIS — R079 Chest pain, unspecified: Secondary | ICD-10-CM

## 2019-01-29 DIAGNOSIS — I4819 Other persistent atrial fibrillation: Secondary | ICD-10-CM | POA: Diagnosis not present

## 2019-01-29 NOTE — Progress Notes (Signed)
HPI  Warren [name] is a 73 y.o. male Seen in followup for atrial fibrillation in the context of hypertension. He takes flecainide Cardizem and aspirin was discontinued with the initiation of apixoban.  The patient was found to be in atrial fibrillation and saw AS NP. Echocardiogram  12/16 demonstrated normal LV function and discordant data on the left atrium (42/1.9/47)  He has repeatedly declined cardioversion, and A Seiler NP intercurrently discontinued his flecainide area   he has been diagnosed with hypothyroidism and Synthroid has been started. His   CHADS-  2 score is one and his CHADS-VASc score is 2.   Date Cr K Hgb  5/19 1.42 4.1           Chronic SOB  No bleeding   Mild edema; no chest pain      Past Medical History:  Diagnosis Date  . Hypertension   . Hypothyroidism   . Increased prostate specific antigen (PSA) velocity   . Morbid obesity (HCC)   . Persistent atrial fibrillation   . Sleep-disordered breathing   . Supraventricular tachycardia (HCC)    a. s/p concealed left lateral pathway ablation 2002 Dr Graciela Husbands    Past Surgical History:  Procedure Laterality Date  . ABLATION  2002   concealed left lateral pathway ablation by Dr Graciela Husbands  . APPENDECTOMY    . CARDIAC CATHETERIZATION  2001   NO CAD  . NM MYOVIEW LTD     12-09  . TONSILLECTOMY      Current Outpatient Medications  Medication Sig Dispense Refill  . apixaban (ELIQUIS) 5 MG TABS tablet Take 1 tablet (5 mg total) by mouth 2 (two) times daily. 180 tablet 3  . co-enzyme Q-10 30 MG capsule Take 30 mg by mouth daily.     Marland Kitchen diltiazem (CARTIA XT) 120 MG 24 hr capsule Take 1 capsule (120 mg total) by mouth daily. 90 capsule 1  . hydrochlorothiazide (HYDRODIURIL) 25 MG tablet take 1 tablet by mouth once daily if needed 90 tablet 1  . HYDROcodone-acetaminophen (NORCO) 10-325 MG tablet Take 1 tablet by mouth 2 (two) times daily as needed for moderate pain. 180 tablet 0  . levothyroxine (SYNTHROID,  LEVOTHROID) 75 MCG tablet Take 1 tablet (75 mcg total) by mouth daily before breakfast. 90 tablet 1  . metoprolol (TOPROL-XL) 200 MG 24 hr tablet Take 1 tablet (200 mg total) by mouth daily. 30 tablet 5  . Multiple Vitamins-Minerals (CENTRUM SILVER PO) Take 1 each by mouth daily.      . nitroGLYCERIN (NITROSTAT) 0.4 MG SL tablet Place 1 tablet (0.4 mg total) under the tongue every 5 (five) minutes as needed for chest pain (x 3 doses). 25 tablet 3  . omeprazole (PRILOSEC) 40 MG capsule Take 1 capsule (40 mg total) by mouth daily. 90 capsule 3  . potassium chloride SA (K-DUR,KLOR-CON) 20 MEQ tablet Take 3 tablets (60 mEq total) by mouth daily. 270 tablet 1  . traZODone (DESYREL) 50 MG tablet Take 1.5 tablets (75 mg total) by mouth at bedtime as needed for sleep. 135 tablet 1  . TURMERIC PO Take 4 capsules by mouth 2 (two) times daily.      No current facility-administered medications for this visit.     No Known Allergies  Review of Systems negative except from HPI and PMH  Physical Exam BP 136/82   Pulse 74   Ht 5\' 10"  (1.778 m)   Wt 258 lb 9.6 oz (117.3 kg)   SpO2 96%  BMI 37.11 kg/m  Well developed and Morbidly obese  in no acute distress HENT normal Neck supple with JVP-flat Carotids brisk and full without bruits Clear Irregularly irregular rate and rhythm with controlled ventricular response, no murmurs or gallops Abd-soft with active BS without hepatomegaly No Clubbing cyanosis tr edema Skin-warm and dry A & Oriented  Grossly normal sensory and motor function    ECG demonstrates  AFib @ 73   Assessment and  Plan  Permanent  atrial fibrillation  Hypertension  Sleep-disordered breathing  Morbidly obese    Encouraged efforts at weight loss  On Anticoagulation;  No bleeding issues   Euvolemic continue current meds  BP reasonably controlled

## 2019-01-29 NOTE — Patient Instructions (Addendum)
Medication Instructions:  Your physician recommends that you continue on your current medications as directed. Please refer to the Current Medication list given to you today.  Labwork: You will have labs drawn today: CBC and BMP  Testing/Procedures: Non-Cardiac CT Angiography (CTA), is a special type of CT scan that uses a computer to produce multi-dimensional views of major blood vessels throughout the body. In CT angiography, a contrast material is injected through an IV to help visualize the blood vessels  Your physician has requested that you have cardiac CT. Cardiac computed tomography (CT) is a painless test that uses an x-ray machine to take clear, detailed pictures of your heart. For further information please visit [email or web address] Please follow instruction sheet as given.     Follow-Up: Your physician recommends that you schedule a follow-up appointment in:   One Year with Dr Graciela Husbands  Any Other Special Instructions Will Be Listed Below (If Applicable).  Please arrive at the [facility] main entrance of [facility] at ______________________AM (30-45 minutes prior to test start time)  [facility] [address] [place], [place] [address] [phone]  Proceed to the [facility] Radiology Department (First Floor).  Please follow these instructions carefully (unless otherwise directed):  Hold all erectile dysfunction medications at least 48 hours prior to test.  On the Night Before the Test: . Be sure to Drink plenty of water. . Do not consume any caffeinated/decaffeinated beverages or chocolate 12 hours prior to your test. . Do not take any antihistamines 12 hours prior to your test.  On the Day of the Test: . Drink plenty of water. Do not drink any water within one hour of the test. . Do not eat any food 4 hours prior to the test. . You may take your regular medications prior to the test.  . Take metoprolol (Lopressor) two hours prior to  test. . HOLD Hydrochlorothiazide morning of the test.      After the Test: . Drink plenty of water. . After receiving IV contrast, you may experience a mild flushed feeling. This is normal. . On occasion, you may experience a mild rash up to 24 hours after the test. This is not dangerous. If this occurs, you can take Benadryl 25 mg and increase your fluid intake. . If you experience trouble breathing, this can be serious. If it is severe call 911 IMMEDIATELY. If it is mild, please call our office.   If you need a refill on your cardiac medications before your next appointment, please call your pharmacy.

## 2019-01-30 LAB — CBC
HEMATOCRIT: 47.3 % (ref 37.5–51.0)
HEMOGLOBIN: 15.9 g/dL (ref 13.0–17.7)
MCH: 30 pg (ref 26.6–33.0)
MCHC: 33.6 g/dL (ref 31.5–35.7)
MCV: 89 fL (ref 79–97)
Platelets: 191 10*3/uL (ref 150–450)
RBC: 5.3 x10E6/uL (ref 4.14–5.80)
RDW: 13.7 % (ref 11.6–15.4)
WBC: 8.7 10*3/uL (ref 3.4–10.8)

## 2019-01-30 LAB — BASIC METABOLIC PANEL
BUN / CREAT RATIO: 16 (ref 10–24)
BUN: 21 mg/dL (ref 8–27)
CALCIUM: 9.5 mg/dL (ref 8.6–10.2)
CHLORIDE: 105 mmol/L (ref 96–106)
CO2: 24 mmol/L (ref 20–29)
CREATININE: 1.33 mg/dL — AB (ref 0.76–1.27)
GFR, EST AFRICAN AMERICAN: 61 mL/min/{1.73_m2} (ref >59)
GFR, EST NON AFRICAN AMERICAN: 53 mL/min/{1.73_m2} — AB (ref >59)
Glucose: 95 mg/dL (ref 65–99)
Potassium: 4.6 mmol/L (ref 3.5–5.2)
Sodium: 144 mmol/L (ref 134–144)

## 2019-02-10 ENCOUNTER — Other Ambulatory Visit: Payer: Self-pay | Admitting: Internal Medicine

## 2019-02-12 ENCOUNTER — Encounter: Payer: Self-pay | Admitting: Internal Medicine

## 2019-03-17 ENCOUNTER — Ambulatory Visit: Payer: Self-pay | Admitting: Internal Medicine

## 2019-03-17 NOTE — Telephone Encounter (Signed)
I got a message from the office.      I let him know they were probably trying to reach him to reschedule his appt.  He said tomorrow afternoon is the best time to reach him though he is home now.  I forwarded this to the office of Dr. Drue Novel.

## 2019-03-17 NOTE — Telephone Encounter (Signed)
Spoke w/ Pt- informed that I didn't see that our office called.

## 2019-03-19 ENCOUNTER — Other Ambulatory Visit: Payer: Self-pay | Admitting: Internal Medicine

## 2019-04-06 ENCOUNTER — Other Ambulatory Visit: Payer: Self-pay | Admitting: Internal Medicine

## 2019-04-13 ENCOUNTER — Telehealth: Payer: Self-pay | Admitting: Internal Medicine

## 2019-04-13 DIAGNOSIS — G8929 Other chronic pain: Secondary | ICD-10-CM

## 2019-04-13 DIAGNOSIS — M549 Dorsalgia, unspecified: Principal | ICD-10-CM

## 2019-04-13 NOTE — Telephone Encounter (Signed)
Copied from CRM [id number]. Topic: Quick Communication - Rx Refill/Question >> Apr 13, 2019  4:14 PM Angela Nevin wrote: Medication:HYDROcodone-acetaminophen [facility]) 10-325 MG tablet  Patient is requesting a refill of this medication.   Preferred Pharmacy (with phone number or street name):[facility] [phone] - [place], [place] - [address] [phone] (Phone) [phone] (Fax)

## 2019-04-14 MED ORDER — HYDROCODONE-ACETAMINOPHEN 10-325 MG PO TABS: 1.0000 | ORAL_TABLET | Freq: Two times a day (BID) | ORAL | 0 refills | Status: DC | PRN

## 2019-04-14 NOTE — Telephone Encounter (Signed)
Pt is requesting refill on hydrocodone.   Last OV: 11/28/2018, appt scheduled 05/27/2019 Last Fill: 01/16/2019 #180 and 0RF UDS: 04/28/2018 Low risk

## 2019-04-14 NOTE — Telephone Encounter (Signed)
Sent Has an appointment scheduled for 05/26/2019

## 2019-04-26 ENCOUNTER — Other Ambulatory Visit: Payer: Self-pay | Admitting: Internal Medicine

## 2019-05-10 ENCOUNTER — Other Ambulatory Visit: Payer: Self-pay | Admitting: Internal Medicine

## 2019-05-27 ENCOUNTER — Encounter: Payer: Self-pay | Admitting: Internal Medicine

## 2019-05-27 ENCOUNTER — Ambulatory Visit (INDEPENDENT_AMBULATORY_CARE_PROVIDER_SITE_OTHER): Payer: Medicare Other | Admitting: Internal Medicine

## 2019-05-27 VITALS — BP 132/78 | HR 68 | Ht 70.0 in | Wt 249.0 lb

## 2019-05-27 DIAGNOSIS — I10 Essential (primary) hypertension: Secondary | ICD-10-CM | POA: Diagnosis not present

## 2019-05-27 DIAGNOSIS — E039 Hypothyroidism, unspecified: Secondary | ICD-10-CM

## 2019-05-27 DIAGNOSIS — I4811 Longstanding persistent atrial fibrillation: Secondary | ICD-10-CM | POA: Diagnosis not present

## 2019-05-27 DIAGNOSIS — R739 Hyperglycemia, unspecified: Secondary | ICD-10-CM

## 2019-05-27 NOTE — Progress Notes (Signed)
Subjective:    Patient ID: Warren [name], male    DOB: November 13, 1946, 73 y.o.   MRN: [id number]  DOS:  05/27/2019 Type of visit - description: Attempted  to make this a video visit, due to technical difficulties from the patient side it was not possible  thus we proceeded with a Virtual Visit via Telephone    I connected with@ on 05/28/19 at  2:00 PM EDT by telephone and verified that I am speaking with the correct person using two identifiers.  THIS ENCOUNTER IS A VIRTUAL VISIT DUE TO COVID-19 - PATIENT WAS NOT SEEN IN THE OFFICE. PATIENT HAS CONSENTED TO VIRTUAL VISIT / TELEMEDICINE VISIT   Location of patient: home  Location of provider: office  I discussed the limitations, risks, security and privacy concerns of performing an evaluation and management service by telephone and the availability of in person appointments. I also discussed with the patient that there may be a patient responsible charge related to this service. The patient expressed understanding and agreed to proceed.   History of Present Illness: Routine office visit The patient has no major concerns. Saw cardiology few months ago, felt to be stable. Ambulatory BPs in the  130s.  BP Readings from Last 3 Encounters:  05/27/19 132/78  01/29/19 136/82  11/28/18 132/68     Review of Systems Denies fever chills No exertional chest pain or difficulty breathing Rarely has cough COVID-19: Following good precautions  Past Medical History:  Diagnosis Date  . Hypertension   . Hypothyroidism   . Increased prostate specific antigen (PSA) velocity   . Morbid obesity (HCC)   . Persistent atrial fibrillation   . Sleep-disordered breathing   . Supraventricular tachycardia (HCC)    a. s/p concealed left lateral pathway ablation 2002 Dr Graciela Husbands    Past Surgical History:  Procedure Laterality Date  . ABLATION  2002   concealed left lateral pathway ablation by Dr Graciela Husbands  . APPENDECTOMY    . CARDIAC CATHETERIZATION  2001    NO CAD  . NM MYOVIEW LTD     12-09  . TONSILLECTOMY      Social History   Socioeconomic History  . Marital status: Single    Spouse name: Not on file  . Number of children: 0  . Years of education: Not on file  . Highest education level: Not on file  Occupational History  . 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 Financial resource strain: Not on file  . Food insecurity:    Worry: Not on file    Inability: Not on file  . Transportation needs:    Medical: Not on file    Non-medical: Not on file  Tobacco Use  . Smoking status: Current Every Day Smoker    Types: Cigars  . Smokeless tobacco: Never Used  . Tobacco comment: HAS 2 LARGE CIGARS A DAY  Substance and Sexual Activity  . Alcohol use: Yes    Alcohol/week: 0.0 standard drinks    Comment: RARE  . Drug use: No  . Sexual activity: Not on file  Lifestyle  . Physical activity:    Days per week: Not on file    Minutes per session: Not on file  . Stress: Not on file  Relationships  . Social connections:    Talks on phone: Not on file    Gets together: Not on file    Attends religious service: Not  on file    Active member of club or organization: Not on file    Attends meetings of clubs or organizations: Not on file    Relationship status: Not on file  . Intimate partner violence:    Fear of current or ex partner: Not on file    Emotionally abused: Not on file    Physically abused: Not on file    Forced sexual activity: Not on file  Other Topics Concern  . Not on file  Social History Narrative   Lives by himself , has no family,  for emergency contact he provided today the number of a  neighbor [phone]                 Allergies as of 05/27/2019   No Known Allergies     Medication List       Accurate as of May 27, 2019 11:59 PM. If you have any questions, ask your nurse or doctor.        apixaban 5 MG Tabs tablet Commonly known as:   Eliquis Take 1 tablet (5 mg total) by mouth 2 (two) times daily.   CENTRUM SILVER PO Take 1 each by mouth daily.   co-enzyme Q-10 30 MG capsule Take 30 mg by mouth daily.   diltiazem 120 MG 24 hr capsule Commonly known as:  Cartia XT Take 1 capsule (120 mg total) by mouth daily.   hydrochlorothiazide 25 MG tablet Commonly known as:  HYDRODIURIL TAKE 1 TABLET BY MOUTH EVERY DAY AS NEEDED   HYDROcodone-acetaminophen 10-325 MG tablet Commonly known as:  NORCO Take 1 tablet by mouth 2 (two) times daily as needed for moderate pain.   levothyroxine 75 MCG tablet Commonly known as:  SYNTHROID Take 1 tablet (75 mcg total) by mouth daily before breakfast.   metoprolol 200 MG 24 hr tablet Commonly known as:  TOPROL-XL Take 1 tablet (200 mg total) by mouth daily.   nitroGLYCERIN 0.4 MG SL tablet Commonly known as:  NITROSTAT Place 1 tablet (0.4 mg total) under the tongue every 5 (five) minutes as needed for chest pain (x 3 doses).   omeprazole 40 MG capsule Commonly known as:  PRILOSEC Take 1 capsule (40 mg total) by mouth daily.   potassium chloride SA 20 MEQ tablet Commonly known as:  K-DUR Take 3 tablets (60 mEq total) by mouth daily.   traZODone 50 MG tablet Commonly known as:  DESYREL Take 1.5 tablets (75 mg total) by mouth at bedtime as needed for sleep.   TURMERIC PO Take 4 capsules by mouth 2 (two) times daily.           Objective:   Physical Exam BP 132/78   Pulse 68   Ht  (1.778 m)   Wt 249 lb (112.9 kg)   BMI 35.73 kg/m  This is virtual phone visit, alert oriented x3, speaking in complete sentences, no distress    Assessment     Assessment   HTN Hypothyroidism SVT: Cardiac cath 2001: No CAD, ablation 2002 Persistent atrial fibrillation -- on Eliquis started 08-2015 Insomnia -- Xanax was dc 2015 d/t requiring increasing doses, was changed to Ativan: didn't work. Was switch to Ambien: He had drowsiness the following day. On trazodone prn H/o   Fatigue Sleep-disordered suspected OSA: declined to have a sleep study Increased PSA velocity: Consistently declined to see urology Obesity Back pain-- hydrocodone prn, rx by pcp   PLAN: HTN: Currently on Diltiazem, HCTZ, metoprolol, potassium.  Seems well controlled, check CMP, FLP. Hypothyroidism: On Synthroid, check a TSH. Afibrillation: Anticoagulated, no symptoms, rate  controlled. Mild hyperglycemia: Check A1c Back pain: On hydrocodone , refill as needed Plan: Labs in few days, RTC CPX 4 months face-to-face    I discussed the assessment and treatment plan with the patient. The patient was provided an opportunity to ask questions and all were answered. The patient agreed with the plan and demonstrated an understanding of the instructions.   The patient was advised to call back or seek an in-person evaluation if the symptoms worsen or if the condition fails to improve as anticipated.  I provided  20  minutes of non-face-to-face time during this encounter.  Willow OraJose Sinjin Amero, MD

## 2019-05-28 NOTE — Assessment & Plan Note (Signed)
HTN: Currently on Diltiazem, HCTZ, metoprolol, potassium.  Seems well controlled, check CMP, FLP. Hypothyroidism: On Synthroid, check a TSH. Afibrillation: Anticoagulated, no symptoms, rate  controlled. Mild hyperglycemia: Check A1c Back pain: On hydrocodone , refill as needed Plan: Labs in few days, RTC CPX 4 months face-to-face

## 2019-05-31 ENCOUNTER — Other Ambulatory Visit: Payer: Self-pay | Admitting: Internal Medicine

## 2019-06-09 ENCOUNTER — Other Ambulatory Visit: Payer: Self-pay | Admitting: Internal Medicine

## 2019-06-27 ENCOUNTER — Other Ambulatory Visit: Payer: Self-pay | Admitting: Internal Medicine

## 2019-07-13 ENCOUNTER — Telehealth: Payer: Self-pay | Admitting: Internal Medicine

## 2019-07-13 DIAGNOSIS — G8929 Other chronic pain: Secondary | ICD-10-CM

## 2019-07-13 MED ORDER — HYDROCODONE-ACETAMINOPHEN 10-325 MG PO TABS: 1.0000 | ORAL_TABLET | Freq: Two times a day (BID) | ORAL | 0 refills | Status: DC | PRN

## 2019-07-13 NOTE — Telephone Encounter (Signed)
Sent!

## 2019-07-13 NOTE — Telephone Encounter (Signed)
Medication Refill - Medication: HYDROcodone-acetaminophen (NORCO) 10-325 MG tablet  Has the patient contacted their pharmacy? Yes - states he must contact office (Agent: If no, request that the patient contact the pharmacy for the refill.) (Agent: If yes, when and what did the pharmacy advise?)  Preferred Pharmacy (with phone number or street name):  [facility] [phone] - [place], [place] - [address] [phone] (Phone) [phone] (Fax)   Agent: Please be advised that RX refills may take up to 3 business days. We ask that you follow-up with your pharmacy.

## 2019-07-13 NOTE — Telephone Encounter (Signed)
Hydrocodone refill.   Last OV: 05/27/2019 Last Fill: 04/14/2019 #180 and 0RF Pt sig: 1 tab bid prn UDS: 04/28/2018 Low risk

## 2019-08-11 ENCOUNTER — Other Ambulatory Visit: Payer: Self-pay | Admitting: Internal Medicine

## 2019-08-11 NOTE — Telephone Encounter (Signed)
Rx denied- Pt never came for labs that PCP requested at virtual visit 05/2019.

## 2019-08-12 ENCOUNTER — Other Ambulatory Visit: Payer: Self-pay

## 2019-08-12 ENCOUNTER — Telehealth: Payer: Self-pay

## 2019-08-12 DIAGNOSIS — I10 Essential (primary) hypertension: Secondary | ICD-10-CM

## 2019-08-12 DIAGNOSIS — E079 Disorder of thyroid, unspecified: Secondary | ICD-10-CM

## 2019-08-12 DIAGNOSIS — E785 Hyperlipidemia, unspecified: Secondary | ICD-10-CM

## 2019-08-12 MED ORDER — LEVOTHYROXINE SODIUM 75 MCG PO TABS: 75.0000 ug | ORAL_TABLET | Freq: Every day | ORAL | 0 refills | Status: DC

## 2019-08-12 NOTE — Telephone Encounter (Signed)
Copied from [place] [id number]. Topic: General - Other >> Aug 12, 2019  2:19 PM Leward Quan A wrote: Reason for CRM: Patient called to get information on his levothyroxine (SYNTHROID) 75 MCG tablet  states that he was waiting on a call to schedule his labs. Asking can Dr Larose Kells send the Rx to the pharmacy for the medication as he is completely out and dont want to go days without it. Please advise

## 2019-08-12 NOTE — Telephone Encounter (Signed)
Pt going to [facility] 8/20 for labs. Advised that levothyroxine will be sent to pharmacy for him and after results are in he will be contacted if there is a need for dose change.

## 2019-08-12 NOTE — Telephone Encounter (Signed)
Rx sent. Labs changed to [facility].

## 2019-08-13 ENCOUNTER — Other Ambulatory Visit (INDEPENDENT_AMBULATORY_CARE_PROVIDER_SITE_OTHER): Payer: Medicare Other

## 2019-08-13 DIAGNOSIS — E079 Disorder of thyroid, unspecified: Secondary | ICD-10-CM

## 2019-08-13 DIAGNOSIS — E785 Hyperlipidemia, unspecified: Secondary | ICD-10-CM | POA: Diagnosis not present

## 2019-08-13 LAB — LIPID PANEL
Cholesterol: 174 mg/dL (ref 0–200)
HDL: 40.2 mg/dL (ref >39.00)
LDL Cholesterol: 105 mg/dL — ABNORMAL HIGH (ref 0–99)
NonHDL: 133.53
Total CHOL/HDL Ratio: 4
Triglycerides: 145 mg/dL (ref 0.0–149.0)
VLDL: 29 mg/dL (ref 0.0–40.0)

## 2019-08-13 LAB — COMPREHENSIVE METABOLIC PANEL
ALT: 21 U/L (ref 0–53)
AST: 20 U/L (ref 0–37)
Albumin: 4.4 g/dL (ref 3.5–5.2)
Alkaline Phosphatase: 40 U/L (ref 39–117)
BUN: 15 mg/dL (ref 6–23)
CO2: 30 mEq/L (ref 19–32)
Calcium: 9.2 mg/dL (ref 8.4–10.5)
Chloride: 101 mEq/L (ref 96–112)
Creatinine, Ser: 1.33 mg/dL (ref 0.40–1.50)
GFR: 52.5 mL/min — ABNORMAL LOW (ref >60.00)
Glucose, Bld: 95 mg/dL (ref 70–99)
Potassium: 3.4 mEq/L — ABNORMAL LOW (ref 3.5–5.1)
Sodium: 140 mEq/L (ref 135–145)
Total Bilirubin: 0.9 mg/dL (ref 0.2–1.2)
Total Protein: 6.9 g/dL (ref 6.0–8.3)

## 2019-08-13 LAB — TSH: TSH: 5.89 u[IU]/mL — ABNORMAL HIGH (ref 0.35–4.50)

## 2019-08-13 LAB — HEMOGLOBIN A1C: Hgb A1c MFr Bld: 5.7 % (ref 4.6–6.5)

## 2019-08-19 MED ORDER — LEVOTHYROXINE SODIUM 100 MCG PO TABS: 100.0000 ug | ORAL_TABLET | Freq: Every day | ORAL | 2 refills | Status: DC

## 2019-08-21 ENCOUNTER — Ambulatory Visit: Payer: Self-pay

## 2019-08-21 NOTE — Telephone Encounter (Signed)
Returned call to pt. to give his lab results.  Stated he has already been given his lab results today.  Noted, by result note, he had been given Dr. Larose Kells' recommendations, from recent lab work.  Pt. denied any questions re: labs, or any other needs at this time.    No Triage was required.

## 2019-09-06 ENCOUNTER — Other Ambulatory Visit: Payer: Self-pay | Admitting: Internal Medicine

## 2019-10-14 ENCOUNTER — Telehealth: Payer: Self-pay | Admitting: Internal Medicine

## 2019-10-14 DIAGNOSIS — G8929 Other chronic pain: Secondary | ICD-10-CM

## 2019-10-14 MED ORDER — HYDROCODONE-ACETAMINOPHEN 10-325 MG PO TABS: 1.0000 | ORAL_TABLET | Freq: Two times a day (BID) | ORAL | 0 refills | Status: DC | PRN

## 2019-10-14 NOTE — Telephone Encounter (Signed)
Hydrocodone refill.   Last OV: 05/27/2019 Last Fill: 07/13/2019 #180 and 0RF Pt sig: 1 tab bid prn UDS: 04/28/2018 Low risk

## 2019-10-14 NOTE — Telephone Encounter (Signed)
Sent!

## 2019-10-14 NOTE — Telephone Encounter (Signed)
Medication Refill - Medication: HYDROcodone-acetaminophen (NORCO) 10-325 MG tablet    Has the patient contacted their pharmacy? Yes.   (Agent: If no, request that the patient contact the pharmacy for the refill.) (Agent: If yes, when and what did the pharmacy advise?)  Preferred Pharmacy (with phone number or street name):  [facility] [phone] - [place], [place] - [address] AT [address]  [place] [address]  Phone: [phone] Fax: [phone]     Agent: Please be advised that RX refills may take up to 3 business days. We ask that you follow-up with your pharmacy.

## 2019-10-23 ENCOUNTER — Other Ambulatory Visit: Payer: Self-pay | Admitting: Internal Medicine

## 2019-11-12 ENCOUNTER — Telehealth: Payer: Self-pay | Admitting: Internal Medicine

## 2019-11-12 NOTE — Telephone Encounter (Signed)
Due for in person CPX and blood work, please arrange

## 2019-11-16 NOTE — Telephone Encounter (Signed)
Patient scheduled cpe with labs for 12/29/2018.

## 2019-11-16 NOTE — Telephone Encounter (Signed)
lvm to call back for cpe and blood work

## 2019-12-02 ENCOUNTER — Other Ambulatory Visit: Payer: Self-pay | Admitting: Internal Medicine

## 2019-12-30 ENCOUNTER — Encounter: Payer: Medicare Other | Admitting: Internal Medicine

## 2019-12-31 ENCOUNTER — Other Ambulatory Visit: Payer: Self-pay | Admitting: Internal Medicine

## 2019-12-31 ENCOUNTER — Other Ambulatory Visit: Payer: Self-pay

## 2020-01-01 ENCOUNTER — Encounter: Payer: Self-pay | Admitting: Internal Medicine

## 2020-01-01 ENCOUNTER — Other Ambulatory Visit: Payer: Self-pay | Admitting: Internal Medicine

## 2020-01-01 ENCOUNTER — Ambulatory Visit (INDEPENDENT_AMBULATORY_CARE_PROVIDER_SITE_OTHER): Payer: Medicare Other | Admitting: Internal Medicine

## 2020-01-01 VITALS — BP 134/99 | HR 40 | Temp 97.9°F | Resp 16 | Ht 70.0 in | Wt 256.4 lb

## 2020-01-01 DIAGNOSIS — E039 Hypothyroidism, unspecified: Secondary | ICD-10-CM | POA: Diagnosis not present

## 2020-01-01 DIAGNOSIS — Z0000 Encounter for general adult medical examination without abnormal findings: Secondary | ICD-10-CM

## 2020-01-01 DIAGNOSIS — I10 Essential (primary) hypertension: Secondary | ICD-10-CM

## 2020-01-01 DIAGNOSIS — Z23 Encounter for immunization: Secondary | ICD-10-CM | POA: Diagnosis not present

## 2020-01-01 DIAGNOSIS — K219 Gastro-esophageal reflux disease without esophagitis: Secondary | ICD-10-CM

## 2020-01-01 HISTORY — DX: Gastro-esophageal reflux disease without esophagitis: K21.9

## 2020-01-01 LAB — BASIC METABOLIC PANEL
BUN: 18 mg/dL (ref 6–23)
CO2: 31 mEq/L (ref 19–32)
Calcium: 9.3 mg/dL (ref 8.4–10.5)
Chloride: 103 mEq/L (ref 96–112)
Creatinine, Ser: 1.31 mg/dL (ref 0.40–1.50)
GFR: 53.37 mL/min — ABNORMAL LOW (ref >60.00)
Glucose, Bld: 93 mg/dL (ref 70–99)
Potassium: 4.2 mEq/L (ref 3.5–5.1)
Sodium: 142 mEq/L (ref 135–145)

## 2020-01-01 LAB — CBC WITH DIFFERENTIAL/PLATELET
Basophils Absolute: 0 10*3/uL (ref 0.0–0.1)
Basophils Relative: 0.4 % (ref 0.0–3.0)
Eosinophils Absolute: 0.1 10*3/uL (ref 0.0–0.7)
Eosinophils Relative: 1.7 % (ref 0.0–5.0)
HCT: 45 % (ref 39.0–52.0)
Hemoglobin: 15.1 g/dL (ref 13.0–17.0)
Lymphocytes Relative: 21.3 % (ref 12.0–46.0)
Lymphs Abs: 1.7 10*3/uL (ref 0.7–4.0)
MCHC: 33.5 g/dL (ref 30.0–36.0)
MCV: 90.8 fl (ref 78.0–100.0)
Monocytes Absolute: 0.8 10*3/uL (ref 0.1–1.0)
Monocytes Relative: 9.5 % (ref 3.0–12.0)
Neutro Abs: 5.4 10*3/uL (ref 1.4–7.7)
Neutrophils Relative %: 67.1 % (ref 43.0–77.0)
Platelets: 184 10*3/uL (ref 150.0–400.0)
RBC: 4.95 Mil/uL (ref 4.22–5.81)
RDW: 14 % (ref 11.5–15.5)
WBC: 8 10*3/uL (ref 4.0–10.5)

## 2020-01-01 LAB — TSH: TSH: 3.09 u[IU]/mL (ref 0.35–4.50)

## 2020-01-01 MED ORDER — LEVOTHYROXINE SODIUM 100 MCG PO TABS: 100.0000 ug | ORAL_TABLET | Freq: Every day | ORAL | 3 refills | Status: DC

## 2020-01-01 MED ORDER — OMEPRAZOLE 40 MG PO CPDR: 40.0000 mg | DELAYED_RELEASE_CAPSULE | Freq: Every day | ORAL | 1 refills | Status: DC

## 2020-01-01 MED ORDER — POTASSIUM CHLORIDE CRYS ER 20 MEQ PO TBCR: 60.0000 meq | EXTENDED_RELEASE_TABLET | Freq: Every day | ORAL | 1 refills | Status: DC

## 2020-01-01 MED ORDER — METOPROLOL SUCCINATE ER 200 MG PO TB24: ORAL_TABLET | ORAL | 2 refills | Status: DC

## 2020-01-01 NOTE — Progress Notes (Signed)
Subjective:    Patient ID: Warren [name], male    DOB: [birth year], 74 y.o.   MRN: [id number]  DOS:  01/01/2020 Type of visit - description: CPX No major concerns    Review of Systems From time to time has mucus accumulation in the throat, does not know if is coming from postnasal dripping or his chest.  Denies cough, chest pain, difficulty breathing.  He is a heavy smoker.  Other than above, a 14 point review of systems is negative     Past Medical History:  Diagnosis Date  . GERD (gastroesophageal reflux disease) 01/01/2020  . Hypertension   . Hypothyroidism   . Increased prostate specific antigen (PSA) velocity   . Morbid obesity (HCC)   . Persistent atrial fibrillation (HCC)   . Sleep-disordered breathing   . Supraventricular tachycardia (HCC)    a. s/p concealed left lateral pathway ablation 2002 Dr Graciela Husbands    Past Surgical History:  Procedure Laterality Date  . ABLATION  2002   concealed left lateral pathway ablation by Dr Graciela Husbands  . APPENDECTOMY    . CARDIAC CATHETERIZATION  2001   NO CAD  . NM MYOVIEW LTD     12-09  . TONSILLECTOMY      Social History   Socioeconomic History  . Marital status: Single    Spouse name: Not on file  . Number of children: 0  . Years of education: Not on file  . Highest education level: Not on file  Occupational History  . 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 Smoking status: Current Every Day Smoker    Types: Cigars  . Smokeless tobacco: Never Used  . Tobacco comment: HAS 2 LARGE CIGARS A DAY  Substance and Sexual Activity  . Alcohol use: Yes    Alcohol/week: 0.0 standard drinks    Comment: RARE  . Drug use: No  . Sexual activity: Not on file  Other Topics Concern  . Not on file  Social History Narrative   Lives by himself , has no family,  for emergency contact he provided today the number of a  neighbor [phone]              Social Determinants of  Health   Financial Resource Strain:   . Difficulty of Paying Living Expenses: Not on file  Food Insecurity:   . Worried About Programme researcher, broadcasting/film/video in the Last Year: Not on file  . Ran Out of Food in the Last Year: Not on file  Transportation Needs:   . Lack of Transportation (Medical): Not on file  . Lack of Transportation (Non-Medical): Not on file  Physical Activity:   . Days of Exercise per Week: Not on file  . Minutes of Exercise per Session: Not on file  Stress:   . Feeling of Stress : Not on file  Social Connections:   . Frequency of Communication with Friends and Family: Not on file  . Frequency of Social Gatherings with Friends and Family: Not on file  . Attends Religious Services: Not on file  . Active Member of Clubs or Organizations: Not on file  . Attends Banker Meetings: Not on file  . Marital Status: Not on file  Intimate Partner Violence:   . Fear of Current or Ex-Partner: Not on file  . Emotionally Abused: Not on file  . Physically Abused: Not on file  . Sexually  Abused: Not on file     Family History  Adopted: Yes  Family history unknown: Yes     Allergies as of 01/01/2020   No Known Allergies     Medication List       Accurate as of January 01, 2020 11:59 PM. If you have any questions, ask your nurse or doctor.        apixaban 5 MG Tabs tablet Commonly known as: Eliquis Take 1 tablet (5 mg total) by mouth 2 (two) times daily.   CENTRUM SILVER PO Take 1 each by mouth daily.   co-enzyme Q-10 30 MG capsule Take 30 mg by mouth daily.   diltiazem 120 MG 24 hr capsule Commonly known as: CARDIZEM CD Take 1 capsule (120 mg total) by mouth daily.   hydrochlorothiazide 25 MG tablet Commonly known as: HYDRODIURIL Take 1 tablet (25 mg total) by mouth daily as needed.   HYDROcodone-acetaminophen 10-325 MG tablet Commonly known as: NORCO Take 1 tablet by mouth 2 (two) times daily as needed for moderate pain.   levothyroxine 100 MCG  tablet Commonly known as: SYNTHROID Take 1 tablet (100 mcg total) by mouth daily before breakfast.   metoprolol 200 MG 24 hr tablet Commonly known as: TOPROL-XL TAKE 1 TABLET(200 MG) BY MOUTH DAILY   nitroGLYCERIN 0.4 MG SL tablet Commonly known as: NITROSTAT Place 1 tablet (0.4 mg total) under the tongue every 5 (five) minutes as needed for chest pain (x 3 doses).   omeprazole 40 MG capsule Commonly known as: PRILOSEC Take 1 capsule (40 mg total) by mouth daily.   potassium chloride SA 20 MEQ tablet Commonly known as: KLOR-CON TAKE 3 TABLETS(60 MEQ) BY MOUTH DAILY What changed: See the new instructions. Changed by: Willow Ora, MD   traZODone 50 MG tablet Commonly known as: DESYREL Take 1.5 tablets (75 mg total) by mouth at bedtime as needed for sleep.   TURMERIC PO Take 4 capsules by mouth 2 (two) times daily.           Objective:   Physical Exam BP (!) 134/99 (BP Location: Right Arm, Cuff Size: Large)   Pulse (!) 40   Temp 97.9 F (36.6 C) (Temporal)   Resp 16   Ht 5\' 10"  (1.778 m)   Wt 256 lb 6.4 oz (116.3 kg)   SpO2 99%   BMI 36.79 kg/m  General:   Well developed, NAD, BMI noted.  HEENT:  Normocephalic . Face symmetric, atraumatic Neck: No thyromegaly Lungs:  CTA B Normal respiratory effort, no intercostal retractions, no accessory muscle use. Heart: Irregularly irregular.  Rate approximately 60. no pretibial edema bilaterally  Abdomen:  Not distended, soft, non-tender. No rebound or rigidity.   Skin: Not pale. Not jaundice Neurologic:  alert & oriented X3.  Speech normal, gait appropriate for age and unassisted Psych--  Cognition and judgment appear intact.  Cooperative with normal attention span and concentration.  Behavior appropriate. No anxious or depressed appearing.     Assessment     Assessment   HTN Hypothyroidism GERD  SVT: Cardiac cath 2001: No CAD, ablation 2002 Persistent atrial fibrillation -- on Eliquis started  08-2015 Insomnia -- Xanax was dc 2015 d/t requiring increasing doses, was changed to Ativan: didn't work. Was switch to Ambien: He had drowsiness the following day. On trazodone prn H/o  Fatigue Sleep-disordered suspected OSA: declined to have a sleep study Increased PSA velocity: Consistently declined to see urology Obesity Back pain-- hydrocodone prn, rx by pcp   PLAN:  Here for CPX HTN: Diastolic BP slightly elevated today, at home is normal per ptl, continue checking ambulatory BPs, continue Cardizem, HCTZ, Toprol, potassium. Hypothyroidism: On Synthroid, check a TSH Atrial fibrillation: Anticoagulated and rate controlled; upon arrival heart rate was in the 40s, I recheck: 60.  Continue present care Insomnia, sleep disturbance: The patient has a inverted sleep pattern for years, he typically sleeps in the daytime and is awake at night.  We discussed the issue, will be difficult to change the pattern however it does not bother him and we agree on action at this time. GERD: On PPIs for years, well controlled. Mild dyslipidemia: States he will not take statins. RTC 6 months   This visit occurred during the SARS-CoV-2 public health emergency.  Safety protocols were in place, including screening questions prior to the visit, additional usage of staff PPE, and extensive cleaning of exam room while observing appropriate contact time as indicated for disinfecting solutions.

## 2020-01-01 NOTE — Patient Instructions (Signed)
GO TO THE LAB : Get the blood work     GO TO THE FRONT DESK Schedule your next appointment for a checkup in 6 months   Continue checking your blood pressures, twice a month BP GOAL is between 110/65 and  135/85. If it is consistently higher or lower, let me know

## 2020-01-03 NOTE — Assessment & Plan Note (Signed)
Here for CPX HTN: Diastolic BP slightly elevated today, at home is normal per ptl, continue checking ambulatory BPs, continue Cardizem, HCTZ, Toprol, potassium. Hypothyroidism: On Synthroid, check a TSH Atrial fibrillation: Anticoagulated and rate controlled; upon arrival heart rate was in the 40s, I recheck: 60.  Continue present care Insomnia, sleep disturbance: The patient has a inverted sleep pattern for years, he typically sleeps in the daytime and is awake at night.  We discussed the issue, will be difficult to change the pattern however it does not bother him and we agree on action at this time. GERD: On PPIs for years, well controlled. Mild dyslipidemia: States he will not take statins. RTC 6 months

## 2020-01-03 NOTE — Assessment & Plan Note (Signed)
-  Td 2019 -  pneumonia shot- 2011 ;  prevnar-- 2015 - shingrex d/w pt, benefits d/w him, declined for now - flu shot : today   - CCS: Never had a Cscope, again declined a screening -Prostate cancer screening: Strongly declined -Lung cancer screening: Declined Discussed the benefits of early cancer detection. Counseled about : diet, exercise. Not ready to quit tobacco.

## 2020-01-04 ENCOUNTER — Encounter: Payer: Self-pay | Admitting: *Deleted

## 2020-01-12 ENCOUNTER — Telehealth: Payer: Self-pay | Admitting: Internal Medicine

## 2020-01-12 DIAGNOSIS — G8929 Other chronic pain: Secondary | ICD-10-CM

## 2020-01-12 MED ORDER — HYDROCODONE-ACETAMINOPHEN 10-325 MG PO TABS: 1.0000 | ORAL_TABLET | Freq: Two times a day (BID) | ORAL | 0 refills | Status: DC | PRN

## 2020-01-12 MED ORDER — TRAZODONE HCL 50 MG PO TABS: 75.0000 mg | ORAL_TABLET | Freq: Every evening | ORAL | 1 refills | Status: DC | PRN

## 2020-01-12 NOTE — Telephone Encounter (Signed)
90-day supply of hydrocodone sent

## 2020-01-12 NOTE — Telephone Encounter (Signed)
Pt already received 3 month supplies of these meds.   Hydrocodone refill.   Last OV: 01/01/2020 Last Fill: 10/14/2019 #180 and 0RF Pt sig: 1 tab bid prn UDS: 04/28/2018 Low risk

## 2020-01-12 NOTE — Telephone Encounter (Signed)
Pt called in wanting 3 month supply of medications if possible Call back # [phone]  Pt called for refill on HYDROcodone-acetaminophen (NORCO) 10-325 MG tablet. Pt has 2 days left. Also wanting refill on traZODone (DESYREL) 50 MG tablet. He has a few days left and using prn for sleep.  [facility] [phone] - [place], [place] - [address] Phone:  [phone]  Fax:  [phone]

## 2020-01-14 ENCOUNTER — Telehealth: Payer: Self-pay

## 2020-01-14 NOTE — Telephone Encounter (Signed)
Patient called in wanting to know when Dr. Drue Novel is going to send over his two prescriptions  To be filled so he can plan a car ride accordingly to be able to pick up his medication thanks.

## 2020-01-14 NOTE — Telephone Encounter (Signed)
Spoke w/ Pt- informed [facility] should let him know that both Rx's were sent on 01/12/2020 and should be ready for pick up.

## 2020-01-18 ENCOUNTER — Other Ambulatory Visit: Payer: Self-pay | Admitting: Internal Medicine

## 2020-01-18 ENCOUNTER — Telehealth: Payer: Self-pay | Admitting: Internal Medicine

## 2020-01-18 NOTE — Telephone Encounter (Signed)
Patient states he was told my pharmacy that his insurance denied the 90 day supply. And for him to have his PCP do a preauth. For him

## 2020-01-18 NOTE — Telephone Encounter (Signed)
PA cancelled.   This medication or product is on your plan's list of covered drugs. Prior authorization is not required at this time. If your pharmacy has questions regarding the processing of your prescription, please have them call the [facility] pharmacy help desk at [phone]. **Please note: Formulary lowering, tiering exception, cost reduction and prospective Medicare hospice reviews cannot be requested using this method of submission. Please contact us at [phone] instead. 

## 2020-01-18 NOTE — Telephone Encounter (Signed)
PA initiated via Covermymeds; KEY: [id number]. Awaiting determination.

## 2020-01-23 ENCOUNTER — Other Ambulatory Visit: Payer: Self-pay | Admitting: Internal Medicine

## 2020-01-25 ENCOUNTER — Other Ambulatory Visit: Payer: Self-pay | Admitting: Internal Medicine

## 2020-01-25 MED ORDER — APIXABAN 5 MG PO TABS: 5.0000 mg | ORAL_TABLET | Freq: Two times a day (BID) | ORAL | 1 refills | Status: DC

## 2020-01-25 NOTE — Telephone Encounter (Signed)
Pt last saw Dr Graciela Husbands 01/29/19, last labs 01/01/20 Creat 1.31, age 74, weight 116.3kg, based on specified criteria pt is on appropriate dosage of Eliquis 5mg  BID.  Will refill rx.

## 2020-01-25 NOTE — Telephone Encounter (Signed)
Pt last saw Dr Graciela Husbands 01/29/19, last labs 01/01/20 Creat 1.31, age 74, weight 116.3kg, based on specified criteria pt is on appropriate dosage of Eliquis 5mg  BID.  Will refill rx.Will resend 90 day rx to [facility] as pt requested.

## 2020-01-25 NOTE — Telephone Encounter (Signed)
*  STAT* If patient is at the pharmacy, call can be transferred to refill team.   1. Which medications need to be refilled? (please list name of each medication and dose if known) ELIQUIS 5 MG TABS tablet  2. Which pharmacy/location (including street and city if local pharmacy) is medication to be sent to? [facility]  3. Do they need a 30 day or 90 day supply? 90 day   Patient states his prescription on [facility] was canceled. He would it to be sent to [facility].

## 2020-02-16 ENCOUNTER — Telehealth: Payer: Self-pay | Admitting: Internal Medicine

## 2020-02-16 DIAGNOSIS — G8929 Other chronic pain: Secondary | ICD-10-CM

## 2020-02-16 MED ORDER — DILTIAZEM HCL ER COATED BEADS 120 MG PO CP24: 120.0000 mg | ORAL_CAPSULE | Freq: Every day | ORAL | 2 refills | Status: DC

## 2020-02-16 NOTE — Telephone Encounter (Signed)
Hydrocodone refill. Spoke w/ [facility] Pt only received #68 tabs on 01/21/2020-- Pt's insurance won't cover a 90 day supply of the hydrocodone this formulary year.  Spoke w/ Pt- informed of above. Pt verbalized understanding. Diltiazem sent as 90 days to [facility].   Hydrocodone-   Last OV: 01/01/2020 Last Fill: 01/12/2020 #180 and 0RF (Pt only received #68tabs on 01/21/20) Pt sig: 1 tab bid prn UDS: 04/28/2018 Low risk

## 2020-02-16 NOTE — Telephone Encounter (Signed)
Medication:diltiazem (CARDIZEM CD) 120 MG 24 hr capsule  HYDROcodone-acetaminophen (NORCO) 10-325 MG tablet   Has the patient contacted their pharmacy? Yes.   (If no, request that the patient contact the pharmacy for the refill.) (If yes, when and what did the pharmacy advise?)  Pt.  Stated that these 2 should have been for 90 day but the pharmacy only filled it for 30 days on both. Pt is 4 days out and needs assistance.   Preferred Pharmacy (with phone number or street name): [facility] [phone] - [place], [place] - [address] AT [facility] [address]  [address] [place] [address]  Phone:  [phone] Fax:  [phone]   Agent: Please be advised that RX refills may take up to 3 business days. We ask that you follow-up with your pharmacy.

## 2020-02-17 MED ORDER — HYDROCODONE-ACETAMINOPHEN 10-325 MG PO TABS: 1.0000 | ORAL_TABLET | Freq: Two times a day (BID) | ORAL | 0 refills | Status: DC | PRN

## 2020-02-17 NOTE — Telephone Encounter (Signed)
PDMP okay, will refill 1  month at a time

## 2020-02-23 ENCOUNTER — Telehealth: Payer: Self-pay

## 2020-02-23 ENCOUNTER — Encounter: Payer: Self-pay | Admitting: Student

## 2020-02-23 ENCOUNTER — Encounter (INDEPENDENT_AMBULATORY_CARE_PROVIDER_SITE_OTHER): Payer: Self-pay

## 2020-02-23 ENCOUNTER — Other Ambulatory Visit: Payer: Self-pay

## 2020-02-23 ENCOUNTER — Ambulatory Visit (INDEPENDENT_AMBULATORY_CARE_PROVIDER_SITE_OTHER): Payer: Medicare Other | Admitting: Student

## 2020-02-23 VITALS — BP 142/88 | HR 87 | Ht 70.0 in | Wt 255.4 lb

## 2020-02-23 DIAGNOSIS — I10 Essential (primary) hypertension: Secondary | ICD-10-CM

## 2020-02-23 DIAGNOSIS — I4811 Longstanding persistent atrial fibrillation: Secondary | ICD-10-CM | POA: Diagnosis not present

## 2020-02-23 DIAGNOSIS — I4819 Other persistent atrial fibrillation: Secondary | ICD-10-CM | POA: Diagnosis not present

## 2020-02-23 MED ORDER — NITROGLYCERIN 0.4 MG SL SUBL: 0.4000 mg | SUBLINGUAL_TABLET | SUBLINGUAL | 3 refills | Status: DC | PRN

## 2020-02-23 NOTE — Telephone Encounter (Signed)
Lpm about his arrival time, 12:30 instead of 12:45.

## 2020-02-23 NOTE — Patient Instructions (Signed)
Medication Instructions:  none *If you need a refill on your cardiac medications before your next appointment, please call your pharmacy*   Lab Work: none If you have labs (blood work) drawn today and your tests are completely normal, you will receive your results only by: Marland Kitchen MyChart Message (if you have MyChart) OR . A paper copy in the mail If you have any lab test that is abnormal or we need to change your treatment, we will call you to review the results.   Testing/Procedures: none   Follow-Up: At [facility], you and your health needs are our priority.  As part of our continuing mission to provide you with exceptional heart care, we have created designated Provider Care Teams.  These Care Teams include your primary Cardiologist (physician) and Advanced Practice Providers (APPs -  Physician Assistants and Nurse Practitioners) who all work together to provide you with the care you need, when you need it.  We recommend signing up for the patient portal called "MyChart".  Sign up information is provided on this After Visit Summary.  MyChart is used to connect with patients for Virtual Visits (Telemedicine).  Patients are able to view lab/test results, encounter notes, upcoming appointments, etc.  Non-urgent messages can be sent to your provider as well.   To learn more about what you can do with MyChart, go to [email or web address].    Your next appointment:   1 year(s)  The format for your next appointment:   Either In Person or Virtual  Provider:   Dr Graciela Husbands   Other Instructions

## 2020-02-23 NOTE — Progress Notes (Signed)
PCP:  Wanda Plump, MD Primary Cardiologist: No primary care provider on file. Electrophysiologist: Dr. Edsel Petrin is a 74 y.o. male with past medical history of HTN, AF, hypothyroidism, obesity, and sleep disorder breathing who presents today for routine electrophysiology followup. They are seen for Dr. Graciela Husbands.   Since last being seen in our clinic, the patient reports doing well overall. He denies any new symptoms. He has chronic unchanged epigastric discomfort that is somewhat related to position, and has been long attributed to a digestive issue. He denies lightheadedness, dizziness, syncope, near syncope, exertional chest pain, bleeding on Eliquis, or neurological complaints.  The patient feels that he is tolerating medications without difficulties and is otherwise without complaint today.   Past Medical History:  Diagnosis Date  . GERD (gastroesophageal reflux disease) 01/01/2020  . Hypertension   . Hypothyroidism   . Increased prostate specific antigen (PSA) velocity   . Morbid obesity (HCC)   . Persistent atrial fibrillation (HCC)   . Sleep-disordered breathing   . Supraventricular tachycardia (HCC)    a. s/p concealed left lateral pathway ablation 2002 Dr Graciela Husbands   Past Surgical History:  Procedure Laterality Date  . ABLATION  2002   concealed left lateral pathway ablation by Dr Graciela Husbands  . APPENDECTOMY    . CARDIAC CATHETERIZATION  2001   NO CAD  . NM MYOVIEW LTD     12-09  . TONSILLECTOMY      Current Outpatient Medications  Medication Sig Dispense Refill  . apixaban (ELIQUIS) 5 MG TABS tablet Take 1 tablet (5 mg total) by mouth 2 (two) times daily. 180 tablet 1  . co-enzyme Q-10 30 MG capsule Take 30 mg by mouth daily.     Marland Kitchen diltiazem (CARDIZEM CD) 120 MG 24 hr capsule Take 1 capsule (120 mg total) by mouth daily. 90 capsule 2  . hydrochlorothiazide (HYDRODIURIL) 25 MG tablet Take 1 tablet (25 mg total) by mouth daily as needed. 90 tablet 1  .  HYDROcodone-acetaminophen (NORCO) 10-325 MG tablet Take 1 tablet by mouth 2 (two) times daily as needed for moderate pain. 60 tablet 0  . levothyroxine (SYNTHROID) 100 MCG tablet Take 1 tablet (100 mcg total) by mouth daily before breakfast. 30 tablet 3  . metoprolol (TOPROL-XL) 200 MG 24 hr tablet TAKE 1 TABLET(200 MG) BY MOUTH DAILY 90 tablet 2  . Multiple Vitamins-Minerals (CENTRUM SILVER PO) Take 1 each by mouth daily.      . nitroGLYCERIN (NITROSTAT) 0.4 MG SL tablet Place 1 tablet (0.4 mg total) under the tongue every 5 (five) minutes as needed for chest pain (x 3 doses). 25 tablet 3  . omeprazole (PRILOSEC) 40 MG capsule Take 1 capsule (40 mg total) by mouth daily. 90 capsule 1  . potassium chloride SA (KLOR-CON) 20 MEQ tablet TAKE 3 TABLETS(60 MEQ) BY MOUTH DAILY 270 tablet 0  . traZODone (DESYREL) 50 MG tablet Take 1.5 tablets (75 mg total) by mouth at bedtime as needed for sleep. 135 tablet 1  . TURMERIC PO Take 4 capsules by mouth 2 (two) times daily.      No current facility-administered medications for this visit.    No Known Allergies  Social History   Socioeconomic History  . Marital status: Single    Spouse name: Not on file  . Number of children: 0  . Years of education: Not on file  . Highest education level: Not on file  Occupational History  . 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 Smoking status: Current Every Day Smoker    Types: Cigars  . Smokeless tobacco: Never Used  . Tobacco comment: HAS 2 LARGE CIGARS A DAY  Substance and Sexual Activity  . Alcohol use: Yes    Alcohol/week: 0.0 standard drinks    Comment: RARE  . Drug use: No  . Sexual activity: Not on file  Other Topics Concern  . Not on file  Social History Narrative   Lives by himself , has no family,  for emergency contact he provided today the number of a  neighbor [phone]              Social Determinants of Health    Financial Resource Strain:   . Difficulty of Paying Living Expenses: Not on file  Food Insecurity:   . Worried About Charity fundraiser in the Last Year: Not on file  . Ran Out of Food in the Last Year: Not on file  Transportation Needs:   . Lack of Transportation (Medical): Not on file  . Lack of Transportation (Non-Medical): Not on file  Physical Activity:   . Days of Exercise per Week: Not on file  . Minutes of Exercise per Session: Not on file  Stress:   . Feeling of Stress : Not on file  Social Connections:   . Frequency of Communication with Friends and Family: Not on file  . Frequency of Social Gatherings with Friends and Family: Not on file  . Attends Religious Services: Not on file  . Active Member of Clubs or Organizations: Not on file  . Attends Archivist Meetings: Not on file  . Marital Status: Not on file  Intimate Partner Violence:   . Fear of Current or Ex-Partner: Not on file  . Emotionally Abused: Not on file  . Physically Abused: Not on file  . Sexually Abused: Not on file     Review of Systems: General: No chills, fever, night sweats or weight changes  Cardiovascular:  No chest pain, dyspnea on exertion, edema, orthopnea, palpitations, paroxysmal nocturnal dyspnea Dermatological: No rash, lesions or masses Respiratory: No cough, dyspnea Urologic: No hematuria, dysuria Abdominal: No nausea, vomiting, diarrhea, bright red blood per rectum, melena, or hematemesis Neurologic: No visual changes, weakness, changes in mental status All other systems reviewed and are otherwise negative except as noted above.  Physical Exam: Vitals:   02/23/20 1258  BP: (!) 142/88  Pulse: 87  SpO2: 98%  Weight: 255 lb 6.4 oz (115.8 kg)  Height: 5\' 10"  (1.778 m)    GEN- The patient is well appearing, alert and oriented x 3 today.   HEENT: normocephalic, atraumatic; sclera clear, conjunctiva pink; hearing intact; oropharynx clear; neck supple, no JVP Lymph- no  cervical lymphadenopathy Lungs- Clear to ausculation bilaterally, normal work of breathing.  No wheezes, rales, rhonchi Heart- Regular rate and rhythm, no murmurs, rubs or gallops, PMI not laterally displaced GI- soft, non-tender, non-distended, bowel sounds present, no hepatosplenomegaly Extremities- no clubbing, cyanosis, or edema; DP/PT/radial pulses 2+ bilaterally MS- no significant deformity or atrophy Skin- warm and dry, no rash or lesion Psych- euthymic mood, full affect Neuro- strength and sensation are intact  EKG is ordered. Personal review of EKG from today shows Atrial fibrillation with controlled VR at 91 bpm  Assessment and Plan:  1. Permanent atrial fibrillation He declined DCCV in the past and was thus taken off flecainide Stable  on cardizem Continue Eliquis for CHA2DS2VASC of 2    2. HTN Continue current regimen  3. Morbid obesity Body mass index is 36.65 kg/m.  Encouraged weight loss  4. Sleep disordered breathing Encouraged weight loss.   5. Hypothroidism Per primary; on Synthroid.   RTC to see Dr. Graciela Husbands 12 months. Sooner with symptoms.  Recent labs reviewed and stable from cardiac perspective.   Graciella Freer, PA-C  02/23/20 1:09 PM

## 2020-03-14 ENCOUNTER — Other Ambulatory Visit: Payer: Self-pay

## 2020-03-14 DIAGNOSIS — M549 Dorsalgia, unspecified: Secondary | ICD-10-CM

## 2020-03-14 DIAGNOSIS — G8929 Other chronic pain: Secondary | ICD-10-CM

## 2020-03-14 MED ORDER — HYDROCODONE-ACETAMINOPHEN 10-325 MG PO TABS: 1.0000 | ORAL_TABLET | Freq: Two times a day (BID) | ORAL | 0 refills | Status: DC | PRN

## 2020-03-14 NOTE — Telephone Encounter (Signed)
PDMP okay, prescription sent x 2 

## 2020-03-14 NOTE — Telephone Encounter (Signed)
Hydrocodone refill.   Last OV: 01/01/2020 Last Fill: 02/17/2020 #60 and 0RF Pt sig: 1 tab bid prn UDS: 04/28/2018 Low risk

## 2020-03-14 NOTE — Telephone Encounter (Signed)
Patient called in to see if Dr. Drue Novel could send in a prescription for HYDROcodone-acetaminophen [facility]) 10-325 MG tablet [[id number]]    Please send it to [id number] - [place], [place] - [address] AT [facility] OF [address]  [address] [place] [address]  Phone:  [phone] Fax:  [phone]  DEA #:  [id number]

## 2020-03-28 ENCOUNTER — Other Ambulatory Visit: Payer: Self-pay | Admitting: Internal Medicine

## 2020-04-18 ENCOUNTER — Telehealth: Payer: Self-pay

## 2020-04-18 DIAGNOSIS — G8929 Other chronic pain: Secondary | ICD-10-CM

## 2020-04-18 MED ORDER — HYDROCODONE-ACETAMINOPHEN 10-325 MG PO TABS: 1.0000 | ORAL_TABLET | Freq: Two times a day (BID) | ORAL | 0 refills | Status: DC | PRN

## 2020-04-18 NOTE — Telephone Encounter (Signed)
PDMP okay, Rx sent x2 

## 2020-04-18 NOTE — Telephone Encounter (Signed)
Hydrocodone refill.   Last OV: 01/01/2020 Last Fill: 03/14/2020 #60 and 0RF Pt sig: 1 tab bid prn UDS: 04/28/2018 Low risk

## 2020-04-18 NOTE — Telephone Encounter (Signed)
Patient called in to see if Dr. Drue Novel can send in a prescription for  HYDROcodone-acetaminophen [facility]) 10-325 MG tablet [[id number]]    Please send it to [facility] #[id number] - [place], [place] - [address] AT [facility] OF [address]  [address] [place] [address]  Phone:  [phone] Fax:  [phone]  DEA #:  [id number]

## 2020-05-05 ENCOUNTER — Other Ambulatory Visit: Payer: Self-pay | Admitting: Internal Medicine

## 2020-05-19 ENCOUNTER — Telehealth: Payer: Self-pay

## 2020-05-19 DIAGNOSIS — G8929 Other chronic pain: Secondary | ICD-10-CM

## 2020-05-19 MED ORDER — HYDROCODONE-ACETAMINOPHEN 10-325 MG PO TABS: 1.0000 | ORAL_TABLET | Freq: Two times a day (BID) | ORAL | 0 refills | Status: DC | PRN

## 2020-05-19 NOTE — Telephone Encounter (Signed)
Hydrocodone refill.   Last OV: 01/01/2020, no appt scheduled Last Fill: 04/18/2020 #60 and 0RF Pt sig: 1 tab bid prn UDS: 04/28/2018 Low risk  Needs UDS at next OV.

## 2020-05-19 NOTE — Telephone Encounter (Signed)
Patient called in to get a prescription refill for HYDROcodone-acetaminophen [facility]) 10-325 MG tablet [[id number]]    Please send it to [id number] - [place], [place] - [address] AT [facility] [address]  [address] [place] [address]  Phone:  [phone] Fax:  [phone]  DEA #:  [id number]

## 2020-05-19 NOTE — Telephone Encounter (Signed)
Mitzo, Please arrange a office visit for 06-2020. === PDMP okay, Rx sent.

## 2020-05-20 NOTE — Telephone Encounter (Signed)
Left a voicemail for the patient to call back and schedule appointment. °

## 2020-05-25 NOTE — Telephone Encounter (Signed)
Left a voicemail for the patient to call back and schedule appointment.

## 2020-06-09 ENCOUNTER — Other Ambulatory Visit: Payer: Self-pay | Admitting: Internal Medicine

## 2020-06-16 ENCOUNTER — Other Ambulatory Visit: Payer: Self-pay | Admitting: Internal Medicine

## 2020-06-28 ENCOUNTER — Telehealth: Payer: Self-pay

## 2020-06-28 DIAGNOSIS — G8929 Other chronic pain: Secondary | ICD-10-CM

## 2020-06-28 MED ORDER — HYDROCODONE-ACETAMINOPHEN 10-325 MG PO TABS: 1.0000 | ORAL_TABLET | Freq: Two times a day (BID) | ORAL | 0 refills | Status: DC | PRN

## 2020-06-28 NOTE — Telephone Encounter (Signed)
Please schedule visit at his earliest convenience. TY.

## 2020-06-28 NOTE — Telephone Encounter (Signed)
PDMP okay, prescription sent, he is due for office visit.

## 2020-06-28 NOTE — Telephone Encounter (Signed)
Left message on pt's VM for him to call office back to schedule follow up appt w/ Dr. Drue Novel at his earliest convenience.

## 2020-06-28 NOTE — Telephone Encounter (Signed)
Pt called requesting refill on Hydrocodone.  Pharmacy is [facility] [address].

## 2020-06-28 NOTE — Telephone Encounter (Signed)
Hydrocodone refill.   Last OV: 01/01/20 Last Fill: 05/19/2020 #60 and 0RF Pt sig: 1 tab bid prn UDS: 04/28/2018 Low risk

## 2020-07-08 ENCOUNTER — Other Ambulatory Visit: Payer: Self-pay | Admitting: Internal Medicine

## 2020-07-11 ENCOUNTER — Other Ambulatory Visit: Payer: Self-pay | Admitting: Internal Medicine

## 2020-07-11 NOTE — Telephone Encounter (Signed)
Pt last saw Maxine Glenn, [place] on 02/23/20, last labs 01/01/20 Creat 1.31, age 74, weight 115.8kg, based on specified criteria pt is on appropriate dosage of Eliquis 5mg  BID.  Will refill rx.

## 2020-07-18 ENCOUNTER — Telehealth: Payer: Self-pay | Admitting: Internal Medicine

## 2020-07-18 NOTE — Telephone Encounter (Signed)
°  Patient wants to speak to the nurse to find out if it is okay for him to get the Pfizer vaccine. He is at the pharmacy now.

## 2020-07-19 NOTE — Telephone Encounter (Signed)
Pt called back and scheduled med refill appt for 08/10/20.

## 2020-07-19 NOTE — Telephone Encounter (Signed)
Left voicemail message for pt to contact RN at [phone]. 

## 2020-07-26 ENCOUNTER — Other Ambulatory Visit: Payer: Self-pay | Admitting: Internal Medicine

## 2020-07-27 NOTE — Telephone Encounter (Signed)
Attempted phone call to pt.  Call went straight to to voicemail unable to leave message due to mailbox being full.

## 2020-08-01 ENCOUNTER — Telehealth: Payer: Self-pay | Admitting: Internal Medicine

## 2020-08-01 DIAGNOSIS — M549 Dorsalgia, unspecified: Secondary | ICD-10-CM

## 2020-08-01 NOTE — Telephone Encounter (Signed)
Patient has an appointment 08/17/20  Medication: HYDROcodone-acetaminophen (NORCO) 10-325 MG tablet [[id number]]     Has the patient contacted their pharmacy?  (If no, request that the patient contact the pharmacy for the refill.) (If yes, when and what did the pharmacy advise?)     Preferred Pharmacy (with phone number or street name): [facility] [phone] - [place], [place] - [address] AT [facility] OF [address] [facility] [address]  [address] [place] [address]  Phone:  [phone] Fax:  [phone]      Agent: Please be advised that RX refills may take up to 3 business days. We ask that you follow-up with your pharmacy.   Marland Kitchen

## 2020-08-01 NOTE — Telephone Encounter (Signed)
Attempted phone call to pt.  Left voicemail message to contact RN at [phone]. 

## 2020-08-02 MED ORDER — HYDROCODONE-ACETAMINOPHEN 10-325 MG PO TABS: 1.0000 | ORAL_TABLET | Freq: Two times a day (BID) | ORAL | 0 refills | Status: DC | PRN

## 2020-08-02 NOTE — Telephone Encounter (Signed)
Requesting: NORCO Contract: N/A UDS:04/28/18 Last Visit:01/01/20 Next Visit:08/17/20 Last Refill:06/28/20  Please Advise

## 2020-08-02 NOTE — Telephone Encounter (Signed)
Patient calling in regards to medication refill, patient states he has to take a taxi to pharmacy and would like to pick up medication while he is out . He will go to pharmacy around 1pm.   Please Advise

## 2020-08-02 NOTE — Telephone Encounter (Signed)
PDMP okay, has an office visit with me this month.  Prescription sent.

## 2020-08-04 ENCOUNTER — Other Ambulatory Visit: Payer: Self-pay | Admitting: Internal Medicine

## 2020-08-10 ENCOUNTER — Ambulatory Visit: Payer: Medicare Other | Admitting: Internal Medicine

## 2020-08-17 ENCOUNTER — Ambulatory Visit (INDEPENDENT_AMBULATORY_CARE_PROVIDER_SITE_OTHER): Payer: Medicare Other | Admitting: Internal Medicine

## 2020-08-17 ENCOUNTER — Encounter: Payer: Self-pay | Admitting: Internal Medicine

## 2020-08-17 ENCOUNTER — Other Ambulatory Visit: Payer: Self-pay

## 2020-08-17 VITALS — BP 126/88 | HR 87 | Temp 97.6°F | Resp 16 | Ht 70.0 in | Wt 244.2 lb

## 2020-08-17 DIAGNOSIS — E079 Disorder of thyroid, unspecified: Secondary | ICD-10-CM

## 2020-08-17 DIAGNOSIS — Z79899 Other long term (current) drug therapy: Secondary | ICD-10-CM | POA: Diagnosis not present

## 2020-08-17 DIAGNOSIS — M549 Dorsalgia, unspecified: Secondary | ICD-10-CM | POA: Diagnosis not present

## 2020-08-17 DIAGNOSIS — I10 Essential (primary) hypertension: Secondary | ICD-10-CM | POA: Diagnosis not present

## 2020-08-17 DIAGNOSIS — G8929 Other chronic pain: Secondary | ICD-10-CM

## 2020-08-17 NOTE — Progress Notes (Signed)
Subjective:    Patient ID: Warren [name], male    DOB: 1946/03/26, 74 y.o.   MRN: [id number]  DOS:  08/17/2020 Type of visit - description: Routine visit In general doing well, good med compliance.   Review of Systems Denies chest pain no difficulty breathing. Has a chronic, mild cough No blood in the stools or blood in the urine  Past Medical History:  Diagnosis Date  . GERD (gastroesophageal reflux disease) 01/01/2020  . Hypertension   . Hypothyroidism   . Increased prostate specific antigen (PSA) velocity   . Morbid obesity (HCC)   . Persistent atrial fibrillation (HCC)   . Sleep-disordered breathing   . Supraventricular tachycardia (HCC)    a. s/p concealed left lateral pathway ablation 2002 Dr Graciela Husbands    Past Surgical History:  Procedure Laterality Date  . ABLATION  2002   concealed left lateral pathway ablation by Dr Graciela Husbands  . APPENDECTOMY    . CARDIAC CATHETERIZATION  2001   NO CAD  . NM MYOVIEW LTD     12-09  . TONSILLECTOMY      Allergies as of 08/17/2020   No Known Allergies     Medication List       Accurate as of August 17, 2020 11:59 PM. If you have any questions, ask your nurse or doctor.        CENTRUM SILVER PO Take 1 each by mouth daily.   co-enzyme Q-10 30 MG capsule Take 30 mg by mouth daily.   diltiazem 120 MG 24 hr capsule Commonly known as: CARDIZEM CD Take 1 capsule (120 mg total) by mouth daily.   Eliquis 5 MG Tabs tablet Generic drug: apixaban TAKE 1 TABLET BY MOUTH  TWICE DAILY   hydrochlorothiazide 25 MG tablet Commonly known as: HYDRODIURIL Take 1 tablet (25 mg total) by mouth daily as needed.   HYDROcodone-acetaminophen 10-325 MG tablet Commonly known as: NORCO Take 1 tablet by mouth 2 (two) times daily as needed for moderate pain.   levothyroxine 100 MCG tablet Commonly known as: SYNTHROID TAKE 1 TABLET(100 MCG) BY MOUTH DAILY BEFORE BREAKFAST   metoprolol 200 MG 24 hr tablet Commonly known as: TOPROL-XL TAKE 1  TABLET(200 MG) BY MOUTH DAILY   nitroGLYCERIN 0.4 MG SL tablet Commonly known as: NITROSTAT Place 1 tablet (0.4 mg total) under the tongue every 5 (five) minutes as needed for chest pain (x 3 doses).   omeprazole 40 MG capsule Commonly known as: PRILOSEC TAKE 1 CAPSULE(40 MG) BY MOUTH DAILY   potassium chloride SA 20 MEQ tablet Commonly known as: KLOR-CON Take 3 tablets (60 mEq total) by mouth daily.   traZODone 50 MG tablet Commonly known as: DESYREL Take 1.5 tablets (75 mg total) by mouth at bedtime as needed for sleep.   TURMERIC PO Take 4 capsules by mouth 2 (two) times daily.          Objective:   Physical Exam BP 126/88 (BP Location: Left Arm, Patient Position: Sitting, Cuff Size: Normal)   Pulse 87   Temp 97.6 F (36.4 C) (Oral)   Resp 16   Ht 5\' 10"  (1.778 m)   Wt 244 lb 4 oz (110.8 kg)   SpO2 97%   BMI 35.05 kg/m  General:   Well developed, NAD, BMI noted. HEENT:  Normocephalic . Face symmetric, atraumatic Lungs:  CTA B Normal respiratory effort, no intercostal retractions, no accessory muscle use. Heart: Seems regular today.  Lower extremities: no pretibial edema bilaterally  Skin: Not  pale. Not jaundice Neurologic:  alert & oriented X3.  Speech normal, gait appropriate for age and unassisted Psych--  Cognition and judgment appear intact.  Cooperative with normal attention span and concentration.  Behavior appropriate. No anxious or depressed appearing.      Assessment      ASSESSMENT HTN Hypothyroidism GERD  SVT: Cardiac cath 2001: No CAD, ablation 2002 Persistent atrial fibrillation -- on Eliquis started 08-2015, declines DCCV Insomnia -- Xanax was dc 2015 d/t requiring increasing doses, was changed to Ativan: didn't work. Was switch to Ambien: He had drowsiness the following day. On trazodone prn H/o  Fatigue Sleep-disordered suspected OSA: declined to have a sleep study Increased PSA velocity: Consistently declined to see  urology Obesity Back pain-- hydrocodone prn, rx by pcp    PLAN: HTN: Continue diltiazem, HCTZ, metoprolol.  Check BMP Hypothyroidism: On Synthroid, check TSH Back pain: On hydrocodone, check UDS SVT, A. fib: Cardiology visit 02/23/2020, note reviewed Has declined DCCV , on flecainide and Eliquis.  Asymptomatic Preventive care: s/p Covid vaccinations, declined Shingrix.  Flu shot this fall recommended. RTC 6 months CPX    This visit occurred during the SARS-CoV-2 public health emergency.  Safety protocols were in place, including screening questions prior to the visit, additional usage of staff PPE, and extensive cleaning of exam room while observing appropriate contact time as indicated for disinfecting solutions.

## 2020-08-17 NOTE — Patient Instructions (Signed)
Check the  blood pressure regularly BP GOAL is between 110/65 and  135/85. If it is consistently higher or lower, let me know      GO TO THE LAB : Get the blood work     GO TO THE FRONT DESK, PLEASE SCHEDULE YOUR APPOINTMENTS Come back for   a physical exam in 6 months 

## 2020-08-17 NOTE — Progress Notes (Signed)
Pre visit review using our clinic review tool, if applicable. No additional management support is needed unless otherwise documented below in the visit note. 

## 2020-08-18 NOTE — Assessment & Plan Note (Signed)
HTN: Continue diltiazem, HCTZ, metoprolol.  Check BMP Hypothyroidism: On Synthroid, check TSH Back pain: On hydrocodone, check UDS SVT, A. fib: Cardiology visit 02/23/2020, note reviewed Has declined DCCV , on flecainide and Eliquis.  Asymptomatic Preventive care: s/p Covid vaccinations, declined Shingrix.  Flu shot this fall recommended. RTC 6 months CPX

## 2020-08-19 LAB — DRUG MONITORING, PANEL 8 WITH CONFIRMATION, URINE
6 Acetylmorphine: NEGATIVE ng/mL (ref <10)
Alcohol Metabolites: NEGATIVE ng/mL
Amphetamine: NEGATIVE ng/mL (ref <250)
Amphetamines: NEGATIVE ng/mL (ref <500)
Benzodiazepines: NEGATIVE ng/mL (ref <100)
Buprenorphine, Urine: NEGATIVE ng/mL (ref <5)
Cocaine Metabolite: NEGATIVE ng/mL (ref <150)
Codeine: NEGATIVE ng/mL (ref <50)
Creatinine: >300 mg/dL
Hydrocodone: 4287 ng/mL — ABNORMAL HIGH (ref <50)
Hydromorphone: 2329 ng/mL — ABNORMAL HIGH (ref <50)
MDMA: NEGATIVE ng/mL (ref <500)
Marijuana Metabolite: NEGATIVE ng/mL (ref <20)
Methamphetamine: NEGATIVE ng/mL (ref <250)
Morphine: NEGATIVE ng/mL (ref <50)
Norhydrocodone: 4385 ng/mL — ABNORMAL HIGH (ref <50)
Opiates: POSITIVE ng/mL — AB (ref <100)
Oxidant: NEGATIVE ug/mL
Oxycodone: NEGATIVE ng/mL (ref <100)
pH: 5.4 (ref 4.5–9.0)

## 2020-08-19 LAB — BASIC METABOLIC PANEL
BUN/Creatinine Ratio: 13 (calc) (ref 6–22)
BUN: 22 mg/dL (ref 7–25)
CO2: 28 mmol/L (ref 20–32)
Calcium: 9.4 mg/dL (ref 8.6–10.3)
Chloride: 106 mmol/L (ref 98–110)
Creat: 1.64 mg/dL — ABNORMAL HIGH (ref 0.70–1.18)
Glucose, Bld: 99 mg/dL (ref 65–99)
Potassium: 4.6 mmol/L (ref 3.5–5.3)
Sodium: 144 mmol/L (ref 135–146)

## 2020-08-19 LAB — TSH: TSH: 2.86 mIU/L (ref 0.40–4.50)

## 2020-08-19 LAB — DM TEMPLATE

## 2020-08-19 NOTE — Telephone Encounter (Signed)
Letter mailed to pt re: contact attempts.  See letter for complete details.

## 2020-08-28 IMAGING — CR DG HAND COMPLETE 3+V*R*
3 series · 3 of 3 positions shown · non-contrast
Comparison: None.

CLINICAL DATA: Rapid swelling of the RIGHT extremity.

EXAM:
RIGHT HAND - COMPLETE 3+ VIEW

[x hand pa right]
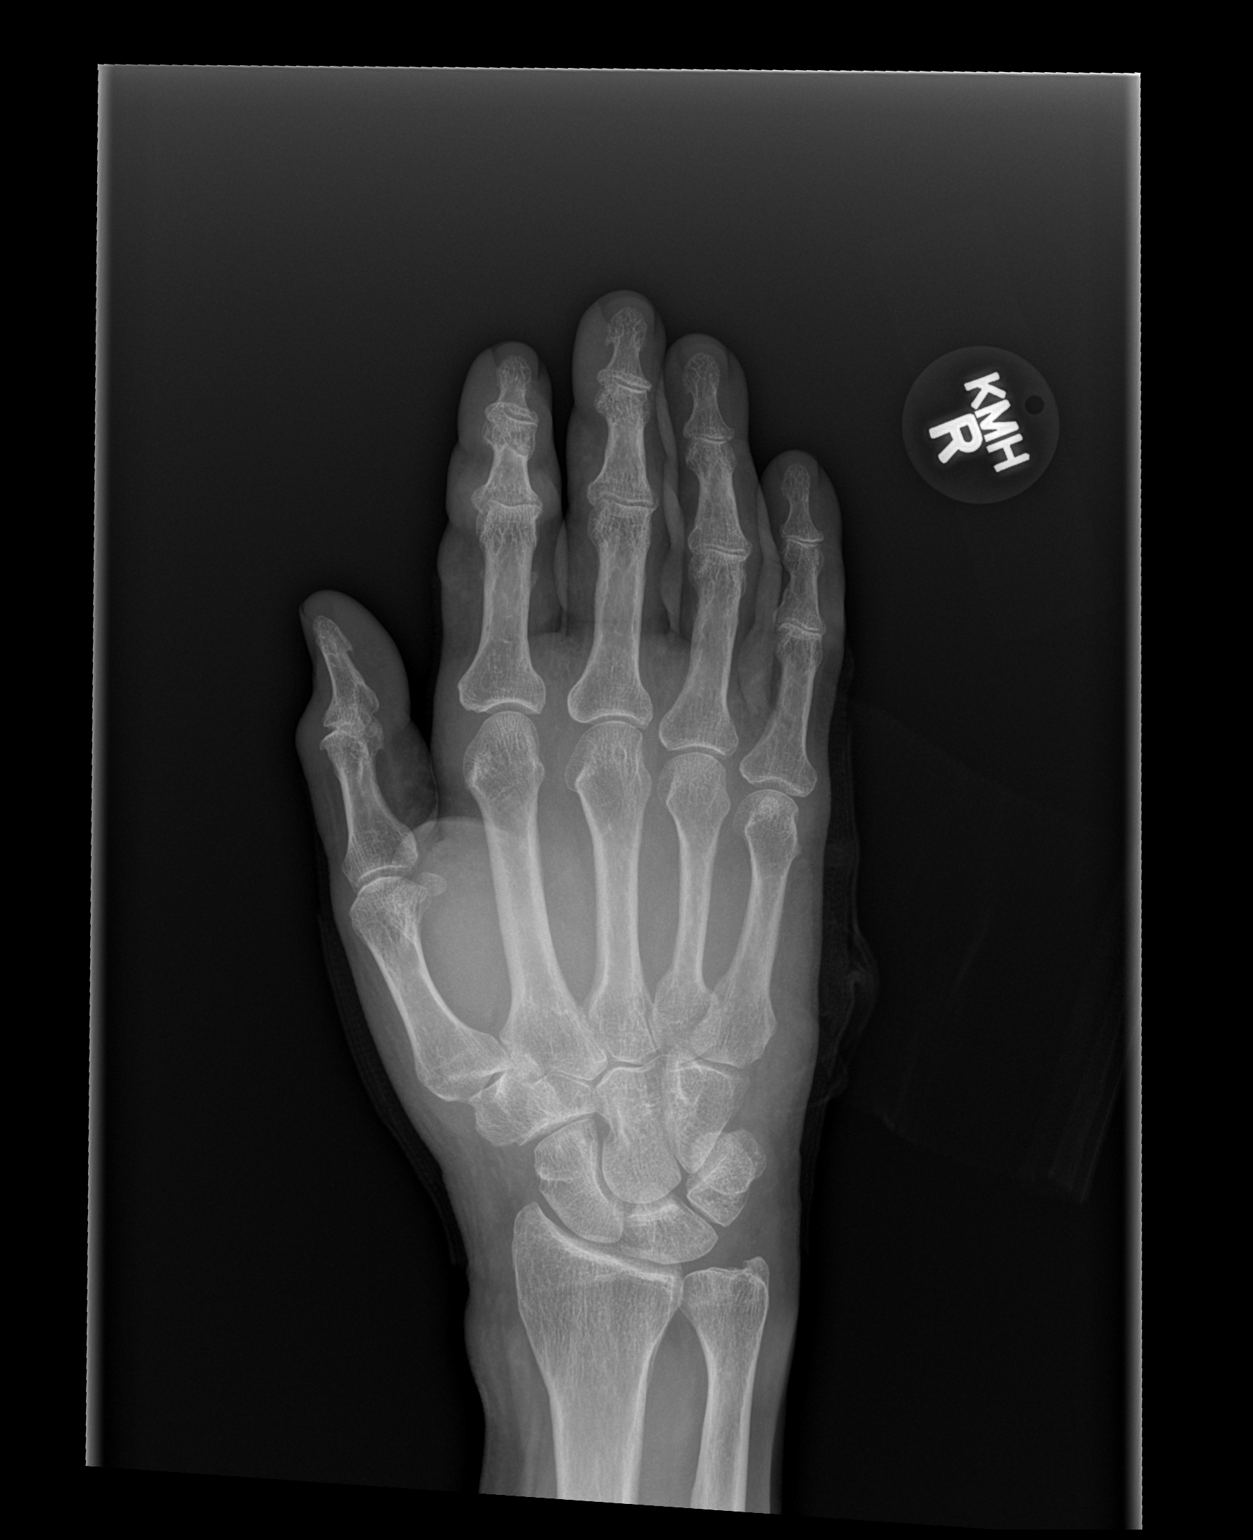

[x hand obl right]
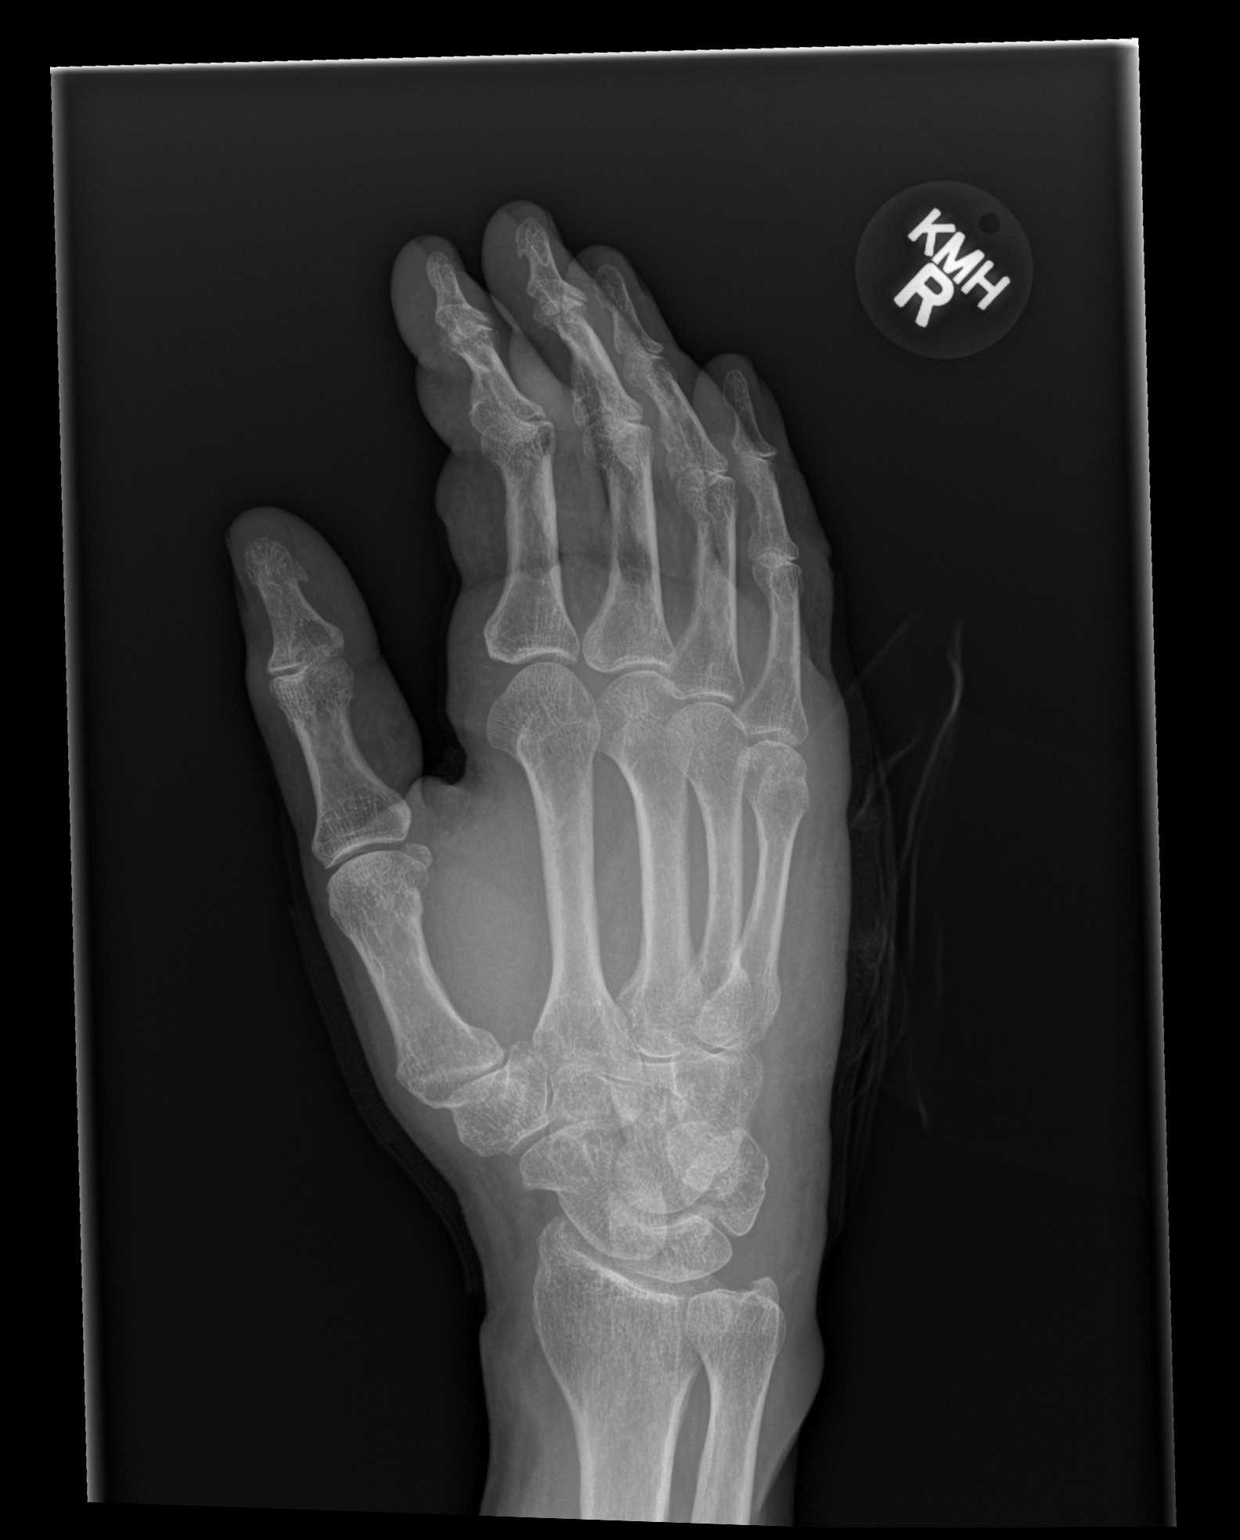

[x hand lat right]
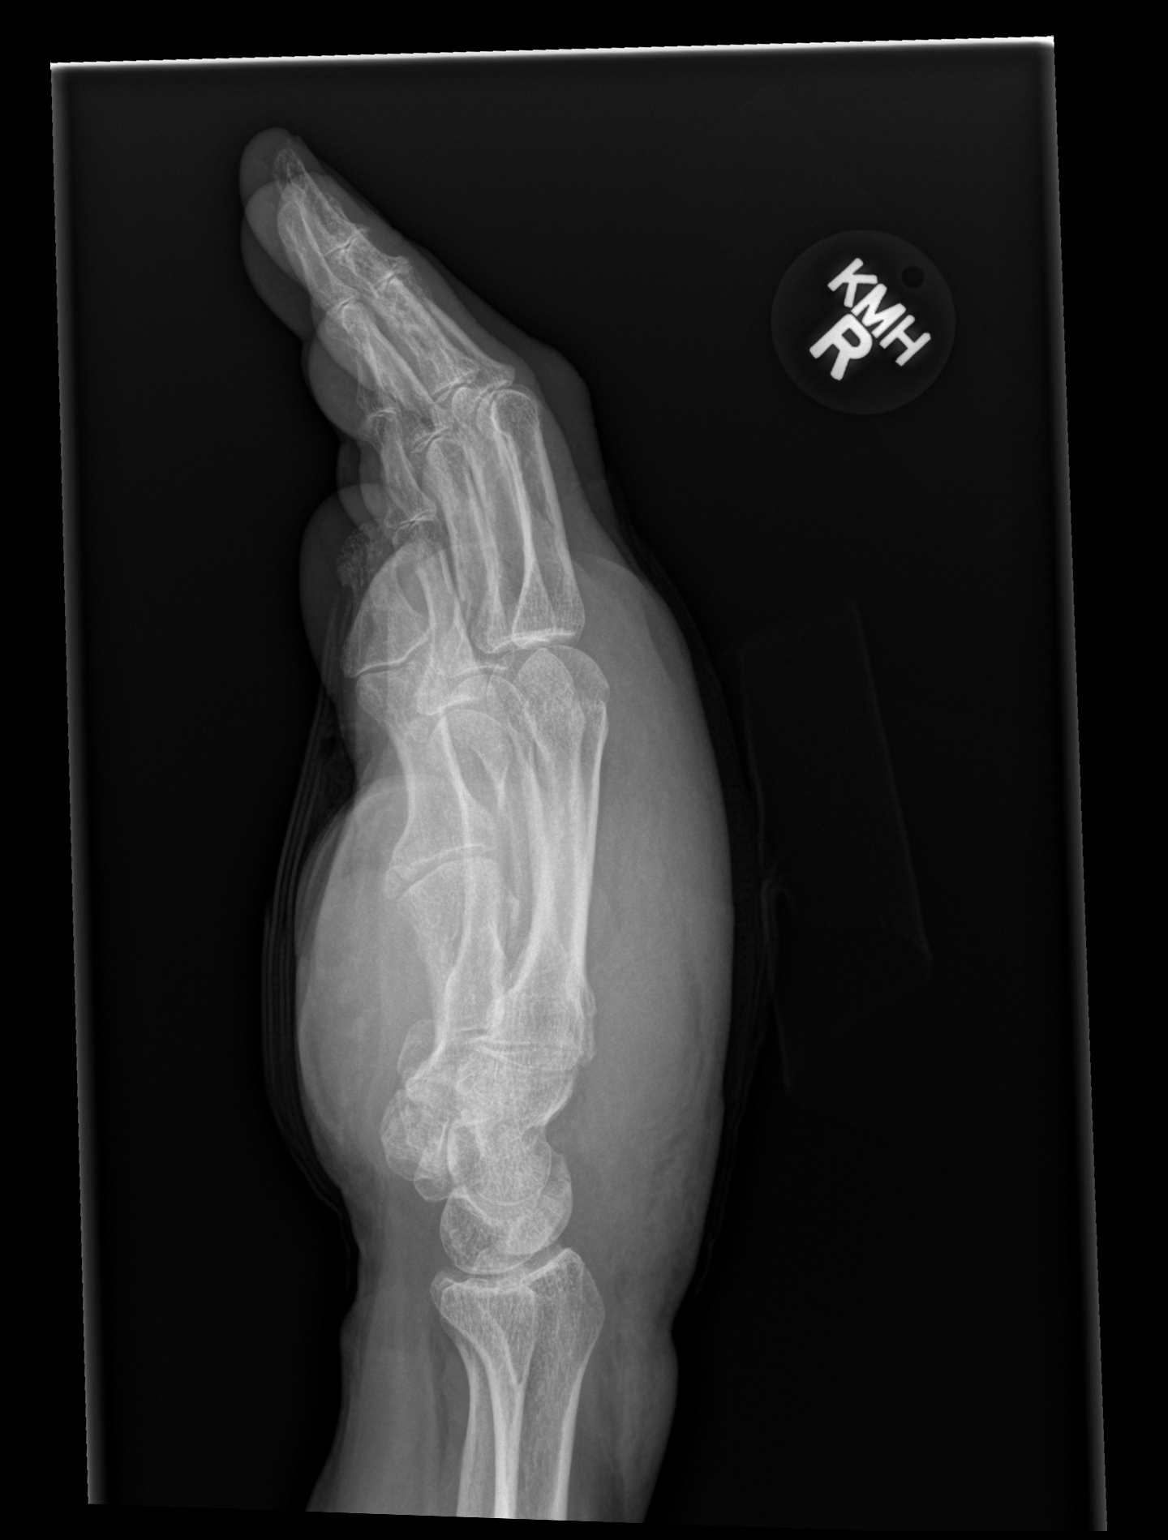

[3 of 3 positions shown; findings below may reference images not displayed]

FINDINGS: No evidence of fracture of the carpal or metacarpal bones.
Radiocarpal joint is intact. Phalanges are normal. Soft tissue
swelling over the dorsum of the hand.
IMPRESSION: Soft tissue swelling.  No osseous abnormality.

## 2020-08-30 ENCOUNTER — Telehealth: Payer: Self-pay | Admitting: Internal Medicine

## 2020-08-30 NOTE — Telephone Encounter (Signed)
Patient states he is returning a call from today. However, he is unsure what the call may have been in regards to. No current notes or results available.

## 2020-09-01 ENCOUNTER — Telehealth: Payer: Self-pay | Admitting: Internal Medicine

## 2020-09-01 NOTE — Telephone Encounter (Signed)
° ° °  Pt is calling back again to speak with RN Mindi Junker, he said she called him and doesn't know what is about.. saw phone note 07/18/20, Mindi Junker been calling him about covid vaccine pfizer. He said he already got the 2 vaccine shot and no longer need to speak with Mindi Junker.

## 2020-09-05 ENCOUNTER — Other Ambulatory Visit: Payer: Self-pay | Admitting: Internal Medicine

## 2020-09-12 ENCOUNTER — Other Ambulatory Visit: Payer: Self-pay | Admitting: Internal Medicine

## 2020-09-12 DIAGNOSIS — M549 Dorsalgia, unspecified: Secondary | ICD-10-CM

## 2020-09-12 NOTE — Telephone Encounter (Signed)
Medication: HYDROcodone-acetaminophen (NORCO) 10-325 MG tablet [[id number]]       Has the patient contacted their pharmacy?  (If no, request that the patient contact the pharmacy for the refill.) (If yes, when and what did the pharmacy advise?)     Preferred Pharmacy (with phone number or street name): [facility] [phone] - [place], [place] - [address] AT [facility] OF [address] [facility] [address]  [address] [place] [address]  Phone:  [phone] Fax:  [phone]     Agent: Please be advised that RX refills may take up to 3 business days. We ask that you follow-up with your pharmacy.

## 2020-09-12 NOTE — Telephone Encounter (Signed)
Hydrocodone refill.   Last OV: 08/17/2020 Last Fill: 08/02/2020 #60 and 0RF Pt sig: 1 tab bid prn UDS: 08/17/2020 Low risk

## 2020-09-13 MED ORDER — HYDROCODONE-ACETAMINOPHEN 10-325 MG PO TABS: 1.0000 | ORAL_TABLET | Freq: Two times a day (BID) | ORAL | 0 refills | Status: DC | PRN

## 2020-09-13 NOTE — Telephone Encounter (Signed)
PDMP okay, Rx sent x2 

## 2020-09-15 ENCOUNTER — Other Ambulatory Visit: Payer: Self-pay | Admitting: Internal Medicine

## 2020-09-29 ENCOUNTER — Other Ambulatory Visit: Payer: Self-pay | Admitting: Internal Medicine

## 2020-10-17 ENCOUNTER — Telehealth: Payer: Self-pay | Admitting: Internal Medicine

## 2020-10-17 DIAGNOSIS — M549 Dorsalgia, unspecified: Secondary | ICD-10-CM

## 2020-10-17 DIAGNOSIS — G8929 Other chronic pain: Secondary | ICD-10-CM

## 2020-10-17 MED ORDER — HYDROCODONE-ACETAMINOPHEN 10-325 MG PO TABS: 1.0000 | ORAL_TABLET | Freq: Two times a day (BID) | ORAL | 0 refills | Status: DC | PRN

## 2020-10-17 NOTE — Telephone Encounter (Signed)
Requesting: hydrocodone 10-325mg   Contract: 04/28/2018 UDS: 08/17/2020 Low risk Last Visit: 08/17/2020 Next Visit: None scheduled Last Refill: 09/13/2020 #60 and 0RF Pt sig: 1 tab bid prn  Please Advise

## 2020-10-17 NOTE — Telephone Encounter (Signed)
PDMP okay, prescription sent x 2 

## 2020-10-17 NOTE — Telephone Encounter (Signed)
Medication: HYDROcodone-acetaminophen (NORCO) 10-325 MG tablet [[id number]]       Has the patient contacted their pharmacy?  (If no, request that the patient contact the pharmacy for the refill.) (If yes, when and what did the pharmacy advise?)     Preferred Pharmacy (with phone number or street name): [facility] [phone] - [place], [place] - [address] AT [facility] OF [address] [facility] [address]  [address] [place] [address]  Phone:  [phone] Fax:  [phone]      Agent: Please be advised that RX refills may take up to 3 business days. We ask that you follow-up with your pharmacy.

## 2020-11-14 ENCOUNTER — Other Ambulatory Visit: Payer: Self-pay | Admitting: Internal Medicine

## 2020-11-21 ENCOUNTER — Telehealth: Payer: Self-pay | Admitting: Internal Medicine

## 2020-11-21 DIAGNOSIS — G8929 Other chronic pain: Secondary | ICD-10-CM

## 2020-11-21 DIAGNOSIS — M549 Dorsalgia, unspecified: Secondary | ICD-10-CM

## 2020-11-21 NOTE — Telephone Encounter (Signed)
Medication: HYDROcodone-acetaminophen (NORCO) 10-325 MG tablet   Has the patient contacted their pharmacy? No. (If no, request that the patient contact the pharmacy for the refill.) (If yes, when and what did the pharmacy advise?)  Preferred Pharmacy (with phone number or street name):  [facility] [phone] - [place], [place] - [address] AT [facility] OF [address] [facility] [address]  [address] [place] [address]  Phone:  [phone] Fax:  [phone] Agent: Please be advised that RX refills may take up to 3 business days. We ask that you follow-up with your pharmacy.

## 2020-11-22 MED ORDER — HYDROCODONE-ACETAMINOPHEN 10-325 MG PO TABS: 1.0000 | ORAL_TABLET | Freq: Two times a day (BID) | ORAL | 0 refills | Status: DC | PRN

## 2020-11-22 NOTE — Telephone Encounter (Signed)
PDMP okay, prescription sent 

## 2020-11-22 NOTE — Telephone Encounter (Signed)
Requesting: hydrocodone 10-325mg   Contract: 04/28/2018 UDS: 08/17/2020 Last Visit: 08/17/2020 Next Visit: None scheduled  Last Refill: 10/17/2020 #60 and 0RF Pt sig: 1 tab bid prn  Please Advise

## 2020-11-26 ENCOUNTER — Other Ambulatory Visit: Payer: Self-pay | Admitting: Internal Medicine

## 2020-12-26 ENCOUNTER — Telehealth: Payer: Self-pay | Admitting: Internal Medicine

## 2020-12-26 DIAGNOSIS — G8929 Other chronic pain: Secondary | ICD-10-CM

## 2020-12-26 DIAGNOSIS — M549 Dorsalgia, unspecified: Secondary | ICD-10-CM

## 2020-12-26 MED ORDER — HYDROCODONE-ACETAMINOPHEN 10-325 MG PO TABS: 1.0000 | ORAL_TABLET | Freq: Two times a day (BID) | ORAL | 0 refills | Status: DC | PRN

## 2020-12-26 NOTE — Telephone Encounter (Signed)
Requesting: hydrocodone 10-325mg  Contract: 04/28/2018 UDS: 08/17/2020 Last Visit: 08/17/2020 Next Visit: None Last Refill: 11/22/2020 #60 and 0RF  Please Advise

## 2020-12-26 NOTE — Telephone Encounter (Signed)
Medication: HYDROcodone-acetaminophen (NORCO) 10-325 MG tablet [[id number]]      Has the patient contacted their pharmacy? NO (If no, request that the patient contact the pharmacy for the refill.) (If yes, when and what did the pharmacy advise?)    Preferred Pharmacy (with phone number or street name): [facility] [phone] - [place], [place] - [address] AT [facility] OF [address] [facility] [address]  [address] [place] [address]  Phone:  [phone] Fax:  [phone]     Agent: Please be advised that RX refills may take up to 3 business days. We ask that you follow-up with your pharmacy.

## 2020-12-26 NOTE — Telephone Encounter (Signed)
PDMP okay, prescription sent x 2 

## 2021-01-19 ENCOUNTER — Other Ambulatory Visit: Payer: Self-pay | Admitting: Internal Medicine

## 2021-01-19 NOTE — Telephone Encounter (Signed)
Eliquis 5mg  refill request received. Patient is 75 years old, weight-110.8kg, Crea-1.64 on 08/17/2020, Diagnosis-Afib, and last seen by 08/19/2020 on 02/23/2020. Dose is appropriate based on dosing criteria. Will send in refill to requested pharmacy.

## 2021-01-23 ENCOUNTER — Telehealth: Payer: Self-pay | Admitting: Internal Medicine

## 2021-01-23 DIAGNOSIS — M549 Dorsalgia, unspecified: Secondary | ICD-10-CM

## 2021-01-23 DIAGNOSIS — G8929 Other chronic pain: Secondary | ICD-10-CM

## 2021-01-23 MED ORDER — HYDROCODONE-ACETAMINOPHEN 10-325 MG PO TABS: 1.0000 | ORAL_TABLET | Freq: Two times a day (BID) | ORAL | 0 refills | Status: DC | PRN

## 2021-01-23 NOTE — Telephone Encounter (Signed)
Requesting: hydrocodone 10/325mg  Contract: 04/28/2018 UDS: 08/17/2020 Last Visit: 08/17/2020 Next Visit: None Last Refill: 12/26/2020 #60 and 0RF  Please Advise

## 2021-01-23 NOTE — Telephone Encounter (Signed)
Medication: HYDROcodone-acetaminophen (NORCO) 10-325 MG tablet    Has the patient contacted their pharmacy? No. (If no, request that the patient contact the pharmacy for the refill.) (If yes, when and what did the pharmacy advise?)  Preferred Pharmacy (with phone number or street name):  [facility] [phone] - [place], [place] - [address] AT [facility] OF [address]  [address] [place] [address]  Phone:  [phone] Fax:  [phone]  DEA #:  [id number]  DAW Reason: --     Agent: Please be advised that RX refills may take up to 3 business days. We ask that you follow-up with your pharmacy.

## 2021-01-23 NOTE — Telephone Encounter (Signed)
PDMP okay, Rx sent 

## 2021-02-10 ENCOUNTER — Other Ambulatory Visit: Payer: Self-pay | Admitting: Internal Medicine

## 2021-03-01 ENCOUNTER — Telehealth: Payer: Self-pay | Admitting: Internal Medicine

## 2021-03-01 DIAGNOSIS — G8929 Other chronic pain: Secondary | ICD-10-CM

## 2021-03-01 MED ORDER — HYDROCODONE-ACETAMINOPHEN 10-325 MG PO TABS: 1.0000 | ORAL_TABLET | Freq: Two times a day (BID) | ORAL | 0 refills | Status: DC | PRN

## 2021-03-01 NOTE — Telephone Encounter (Signed)
Requesting: Contract: 04/28/2018 UDS: 08/17/2020 Last Visit: 08/17/2020 Next Visit: None Last Refill: 01/23/2021 #60 and 0RF  Please Advise

## 2021-03-01 NOTE — Telephone Encounter (Signed)
MedicationHYDROcodone-acetaminophen (NORCO) 10-325 MG tablet [[id number]]      Has the patient contacted their pharmacy? no (If no, request that the patient contact the pharmacy for the refill.) (If yes, when and what did the pharmacy advise?)    Preferred Pharmacy (with phone number or street name):  [facility] [phone] - [place], [place] - [address] AT [address] OF [address] Phone:  [phone]  Fax:  [phone]         Agent: Please be advised that RX refills may take up to 3 business days. We ask that you follow-up with your pharmacy.

## 2021-03-01 NOTE — Telephone Encounter (Signed)
PDMP ok, rx sent  

## 2021-03-02 ENCOUNTER — Encounter: Payer: Self-pay | Admitting: Internal Medicine

## 2021-03-05 ENCOUNTER — Other Ambulatory Visit: Payer: Self-pay | Admitting: Internal Medicine

## 2021-03-06 ENCOUNTER — Other Ambulatory Visit: Payer: Self-pay | Admitting: Internal Medicine

## 2021-03-31 ENCOUNTER — Telehealth: Payer: Self-pay | Admitting: Internal Medicine

## 2021-03-31 NOTE — Telephone Encounter (Signed)
Medication:  HYDROcodone-acetaminophen (NORCO) 10-325 MG tablet [[id number]]      Has the patient contacted their pharmacy?  (If no, request that the patient contact the pharmacy for the refill.) (If yes, when and what did the pharmacy advise?)     Preferred Pharmacy (with phone number or street name): [facility] [phone] - [place], [place] - [address] AT [facility] OF [address] [facility] [address]  [address] [place] [address]  Phone:  [phone] Fax:  [phone]      Agent: Please be advised that RX refills may take up to 3 business days. We ask that you follow-up with your pharmacy.

## 2021-03-31 NOTE — Telephone Encounter (Signed)
Pt is over due for appt.

## 2021-04-03 ENCOUNTER — Other Ambulatory Visit: Payer: Self-pay | Admitting: Internal Medicine

## 2021-04-03 NOTE — Telephone Encounter (Signed)
Left voice mail to schedule appt

## 2021-04-09 ENCOUNTER — Other Ambulatory Visit: Payer: Self-pay | Admitting: Internal Medicine

## 2021-04-12 ENCOUNTER — Ambulatory Visit: Payer: Medicare Other | Admitting: Internal Medicine

## 2021-04-12 ENCOUNTER — Encounter: Payer: Self-pay | Admitting: Internal Medicine

## 2021-04-12 ENCOUNTER — Other Ambulatory Visit: Payer: Self-pay

## 2021-04-12 VITALS — BP 128/84 | HR 89 | Temp 97.7°F | Resp 18 | Ht 70.0 in | Wt 251.4 lb

## 2021-04-12 DIAGNOSIS — I10 Essential (primary) hypertension: Secondary | ICD-10-CM

## 2021-04-12 DIAGNOSIS — I4811 Longstanding persistent atrial fibrillation: Secondary | ICD-10-CM

## 2021-04-12 DIAGNOSIS — E039 Hypothyroidism, unspecified: Secondary | ICD-10-CM | POA: Diagnosis not present

## 2021-04-12 DIAGNOSIS — G8929 Other chronic pain: Secondary | ICD-10-CM

## 2021-04-12 DIAGNOSIS — M549 Dorsalgia, unspecified: Secondary | ICD-10-CM

## 2021-04-12 DIAGNOSIS — Z79899 Other long term (current) drug therapy: Secondary | ICD-10-CM

## 2021-04-12 MED ORDER — HYDROCODONE-ACETAMINOPHEN 10-325 MG PO TABS: 1.0000 | ORAL_TABLET | Freq: Two times a day (BID) | ORAL | 0 refills | Status: DC | PRN

## 2021-04-12 MED ORDER — NITROGLYCERIN 0.4 MG SL SUBL: 0.4000 mg | SUBLINGUAL_TABLET | SUBLINGUAL | 3 refills | Status: DC | PRN

## 2021-04-12 NOTE — Patient Instructions (Addendum)
Check the  blood pressure   BP GOAL is between 110/65 and  135/85. If it is consistently higher or lower, let me know    Please reach out to cardiology and get an appointment  Please consider shingles shot (Shingrix)  You will be due for another COVID vaccination by June 2022.    GO TO THE LAB : Get the blood work     GO TO THE FRONT DESK, PLEASE SCHEDULE YOUR APPOINTMENTS Come back for a physical  exam in 6 months       "Living will", "Health Care Power of attorney": Advanced care planning  (If you already have a living will or healthcare power of attorney, please bring the copy to be scanned in your chart.)  Advance care planning is a process that supports adults in  understanding and sharing their preferences regarding future medical care.   The patient's preferences are recorded in documents called Advance Directives.    Advanced directives are completed (and can be modified at any time) while the patient is in full mental capacity.   The documentation should be available at all times to the patient, the family and the healthcare providers.  Bring in a copy to be scanned in your chart is an excellent idea and is recommended   This legal documents direct treatment decision making and/or appoint a surrogate to make the decision if the patient is not capable to do so.    Advance directives can be documented in many types of formats,  documents have names such as:  Lliving will  Durable power of attorney for healthcare (healthcare proxy or healthcare power of attorney)  Combined directives  Physician orders for life-sustaining treatment    More information at:  [email or web address]

## 2021-04-12 NOTE — Progress Notes (Signed)
Subjective:    Patient ID: Warren [name], male    DOB: 06-27-1946, 75 y.o.   MRN: [id number]  DOS:  04/12/2021 Type of visit - description: Routine office visit Since the last office visit is doing well. Needs some refills. Ambulatory BPs are very good.  Review of Systems Denies chest pain no difficulty breathing No lower extremity edema. No palpitations No anxiety or depression   Past Medical History:  Diagnosis Date  . GERD (gastroesophageal reflux disease) 01/01/2020  . Hypertension   . Hypothyroidism   . Increased prostate specific antigen (PSA) velocity   . Morbid obesity (HCC)   . Persistent atrial fibrillation (HCC)   . Sleep-disordered breathing   . Supraventricular tachycardia (HCC)    a. s/p concealed left lateral pathway ablation 2002 Dr Graciela Husbands    Past Surgical History:  Procedure Laterality Date  . ABLATION  2002   concealed left lateral pathway ablation by Dr Graciela Husbands  . APPENDECTOMY    . CARDIAC CATHETERIZATION  2001   NO CAD  . NM MYOVIEW LTD     12-09  . TONSILLECTOMY      Allergies as of 04/12/2021   No Known Allergies     Medication List       Accurate as of April 12, 2021 11:59 PM. If you have any questions, ask your nurse or doctor.        CENTRUM SILVER PO Take 1 each by mouth daily.   co-enzyme Q-10 30 MG capsule Take 30 mg by mouth daily.   diltiazem 120 MG 24 hr capsule Commonly known as: CARDIZEM CD TAKE 1 CAPSULE(120 MG) BY MOUTH DAILY   Eliquis 5 MG Tabs tablet Generic drug: apixaban TAKE 1 TABLET BY MOUTH  TWICE DAILY   hydrochlorothiazide 25 MG tablet Commonly known as: HYDRODIURIL Take 1 tablet (25 mg total) by mouth daily as needed.   HYDROcodone-acetaminophen 10-325 MG tablet Commonly known as: NORCO Take 1 tablet by mouth 2 (two) times daily as needed for moderate pain.   levothyroxine 100 MCG tablet Commonly known as: SYNTHROID Take 1 tablet (100 mcg total) by mouth daily before breakfast.   metoprolol 200 MG  24 hr tablet Commonly known as: TOPROL-XL Take 1 tablet (200 mg total) by mouth daily.   nitroGLYCERIN 0.4 MG SL tablet Commonly known as: NITROSTAT Place 1 tablet (0.4 mg total) under the tongue every 5 (five) minutes x 3 doses as needed for chest pain. What changed:   when to take this  reasons to take this Changed by: Willow Ora, MD   omeprazole 40 MG capsule Commonly known as: PRILOSEC Take 1 capsule (40 mg total) by mouth daily before breakfast.   potassium chloride SA 20 MEQ tablet Commonly known as: KLOR-CON Take 3 tablets (60 mEq total) by mouth daily.   traZODone 50 MG tablet Commonly known as: DESYREL Take 1.5 tablets (75 mg total) by mouth at bedtime as needed for sleep.   TURMERIC PO Take 4 capsules by mouth 2 (two) times daily.          Objective:   Physical Exam BP 128/84 (BP Location: Right Arm, Patient Position: Sitting, Cuff Size: Normal)   Pulse 89   Temp 97.7 F (36.5 C) (Oral)   Resp 18   Ht 5\' 10"  (1.778 m)   Wt 251 lb 6 oz (114 kg)   SpO2 98%   BMI 36.07 kg/m  General:   Well developed, NAD, BMI noted.  HEENT:  Normocephalic . Face  symmetric, atraumatic Neck: No thyromegaly Lungs:  CTA B Normal respiratory effort, no intercostal retractions, no accessory muscle use. Heart: Seems regular, no murmur.  Abdomen:  Not distended, soft, non-tender. No rebound or rigidity.   Skin: Not pale. Not jaundice Lower extremities: no pretibial edema bilaterally  Neurologic:  alert & oriented X3.  Speech normal, gait appropriate for age and unassisted Psych--  Cognition and judgment appear intact.  Cooperative with normal attention span and concentration.  Behavior appropriate. No anxious or depressed appearing.     Assessment      ASSESSMENT HTN Hypothyroidism GERD  SVT: Cardiac cath 2001: No CAD, ablation 2002 Persistent atrial fibrillation -- on Eliquis started 08-2015, declines DCCV Insomnia -- Xanax was dc 2015 d/t requiring  increasing doses, was changed to Ativan: didn't work. Was switch to Ambien: He had drowsiness the following day. On trazodone prn H/o  Fatigue Sleep-disordered suspected OSA: declined to have a sleep study Increased PSA velocity: Consistently declined to see urology Obesity Back pain-- hydrocodone prn, rx by pcp    PLAN: HTN: continue HCTZ, metoprolol, potassium, Cardizem.  Checking labs Hypothyroidism: RF medicines, check TSH Persistent A. fib: Anticoagulated, checking a CBC, rec to see  cardiology, last seen 02/2020. NTG: Has NTG available but has not used it, RF sent. Insomnia: On trazodone Back pain chronic: On hydrocodone, contract today, UDS.   PDMP reviewed, last hydrocodone dispensed 04/03/2021, is slightly early but will send Rx.  Preventive care:  Had 3 COVID VAX, recommend a booster. Shingrix: Discussed POA: Discussed RTC 6 months  This visit occurred during the SARS-CoV-2 public health emergency.  Safety protocols were in place, including screening questions prior to the visit, additional usage of staff PPE, and extensive cleaning of exam room while observing appropriate contact time as indicated for disinfecting solutions.

## 2021-04-13 LAB — CBC WITH DIFFERENTIAL/PLATELET
Basophils Absolute: 0 10*3/uL (ref 0.0–0.1)
Basophils Relative: 0.5 % (ref 0.0–3.0)
Eosinophils Absolute: 0.1 10*3/uL (ref 0.0–0.7)
Eosinophils Relative: 1.7 % (ref 0.0–5.0)
HCT: 46.5 % (ref 39.0–52.0)
Hemoglobin: 15.3 g/dL (ref 13.0–17.0)
Lymphocytes Relative: 19 % (ref 12.0–46.0)
Lymphs Abs: 1.4 10*3/uL (ref 0.7–4.0)
MCHC: 33 g/dL (ref 30.0–36.0)
MCV: 91.1 fl (ref 78.0–100.0)
Monocytes Absolute: 0.5 10*3/uL (ref 0.1–1.0)
Monocytes Relative: 7.1 % (ref 3.0–12.0)
Neutro Abs: 5.4 10*3/uL (ref 1.4–7.7)
Neutrophils Relative %: 71.7 % (ref 43.0–77.0)
Platelets: 187 10*3/uL (ref 150.0–400.0)
RBC: 5.1 Mil/uL (ref 4.22–5.81)
RDW: 15 % (ref 11.5–15.5)
WBC: 7.5 10*3/uL (ref 4.0–10.5)

## 2021-04-13 LAB — LIPID PANEL
Cholesterol: 155 mg/dL (ref 0–200)
HDL: 40.6 mg/dL (ref >39.00)
LDL Cholesterol: 97 mg/dL (ref 0–99)
NonHDL: 114.53
Total CHOL/HDL Ratio: 4
Triglycerides: 86 mg/dL (ref 0.0–149.0)
VLDL: 17.2 mg/dL (ref 0.0–40.0)

## 2021-04-13 LAB — COMPREHENSIVE METABOLIC PANEL
ALT: 28 U/L (ref 0–53)
AST: 23 U/L (ref 0–37)
Albumin: 4 g/dL (ref 3.5–5.2)
Alkaline Phosphatase: 56 U/L (ref 39–117)
BUN: 19 mg/dL (ref 6–23)
CO2: 31 mEq/L (ref 19–32)
Calcium: 9.2 mg/dL (ref 8.4–10.5)
Chloride: 103 mEq/L (ref 96–112)
Creatinine, Ser: 1.3 mg/dL (ref 0.40–1.50)
GFR: 53.51 mL/min — ABNORMAL LOW (ref >60.00)
Glucose, Bld: 99 mg/dL (ref 70–99)
Potassium: 4.5 mEq/L (ref 3.5–5.1)
Sodium: 142 mEq/L (ref 135–145)
Total Bilirubin: 0.5 mg/dL (ref 0.2–1.2)
Total Protein: 6.3 g/dL (ref 6.0–8.3)

## 2021-04-13 LAB — TSH: TSH: 3.35 u[IU]/mL (ref 0.35–4.50)

## 2021-04-13 NOTE — Assessment & Plan Note (Signed)
HTN: continue HCTZ, metoprolol, potassium, Cardizem.  Checking labs Hypothyroidism: RF medicines, check TSH Persistent A. fib: Anticoagulated, checking a CBC, rec to see  cardiology, last seen 02/2020. NTG: Has NTG available but has not used it, RF sent. Insomnia: On trazodone Back pain chronic: On hydrocodone, contract today, UDS.   PDMP reviewed, last hydrocodone dispensed 04/03/2021, is slightly early but will send Rx.  Preventive care:  Had 3 COVID VAX, recommend a booster. Shingrix: Discussed POA: Discussed RTC 6 months

## 2021-04-15 LAB — DRUG MONITORING, PANEL 8 WITH CONFIRMATION, URINE
6 Acetylmorphine: NEGATIVE ng/mL (ref <10)
Alcohol Metabolites: NEGATIVE ng/mL
Amphetamine: NEGATIVE ng/mL (ref <250)
Amphetamines: NEGATIVE ng/mL (ref <500)
Benzodiazepines: NEGATIVE ng/mL (ref <100)
Buprenorphine, Urine: NEGATIVE ng/mL (ref <5)
Cocaine Metabolite: NEGATIVE ng/mL (ref <150)
Codeine: NEGATIVE ng/mL (ref <50)
Creatinine: >300 mg/dL
Hydrocodone: 6247 ng/mL — ABNORMAL HIGH (ref <50)
Hydromorphone: 2680 ng/mL — ABNORMAL HIGH (ref <50)
MDMA: NEGATIVE ng/mL (ref <500)
Marijuana Metabolite: NEGATIVE ng/mL (ref <20)
Methamphetamine: NEGATIVE ng/mL (ref <250)
Morphine: NEGATIVE ng/mL (ref <50)
Norhydrocodone: 4622 ng/mL — ABNORMAL HIGH (ref <50)
Opiates: POSITIVE ng/mL — AB (ref <100)
Oxidant: NEGATIVE ug/mL
Oxycodone: NEGATIVE ng/mL (ref <100)
pH: 5.3 (ref 4.5–9.0)

## 2021-04-15 LAB — DM TEMPLATE

## 2021-04-18 MED ORDER — LEVOTHYROXINE SODIUM 100 MCG PO TABS: 100.0000 ug | ORAL_TABLET | Freq: Every day | ORAL | 1 refills | Status: DC

## 2021-04-18 NOTE — Addendum Note (Signed)
Addended byConrad Pawnee D on: 04/18/2021 07:47 AM   Modules accepted: Orders

## 2021-05-02 ENCOUNTER — Telehealth: Payer: Self-pay

## 2021-05-02 NOTE — Telephone Encounter (Signed)
Pt called for hydrocodone refill- informed it looked like it was refilled by Dr. Drue Novel on 04/12/2021. Pt states he doesn't have any left and is sure he hasn't been taking more than bid. I placed him on hold and called [facility]- last filled on 03/03/2021- they will get Rx ready for him to pick up. Pt made aware.

## 2021-05-14 ENCOUNTER — Other Ambulatory Visit: Payer: Self-pay | Admitting: Internal Medicine

## 2021-06-02 ENCOUNTER — Other Ambulatory Visit: Payer: Self-pay | Admitting: Internal Medicine

## 2021-06-02 ENCOUNTER — Telehealth: Payer: Self-pay | Admitting: Internal Medicine

## 2021-06-02 DIAGNOSIS — M549 Dorsalgia, unspecified: Secondary | ICD-10-CM

## 2021-06-02 MED ORDER — HYDROCODONE-ACETAMINOPHEN 10-325 MG PO TABS: 1.0000 | ORAL_TABLET | Freq: Two times a day (BID) | ORAL | 0 refills | Status: DC | PRN

## 2021-06-02 NOTE — Telephone Encounter (Signed)
Requesting: hydrocodone 10-325mg  Contract: 04/12/2021 UDS: 04/12/2021 Last Visit: 04/12/2021 Next Visit: 10/18/2021 Last Refill: 04/12/2021 #60 and 0RF  Please Advise

## 2021-06-02 NOTE — Telephone Encounter (Signed)
PDMP okay, Rx sent x2 

## 2021-06-02 NOTE — Telephone Encounter (Signed)
Medication: HYDROcodone-acetaminophen (NORCO) 10-325 MG tablet [[id number]]     Has the patient contacted their pharmacy? NO (If no, request that the patient contact the pharmacy for the refill.) (If yes, when and what did the pharmacy advise?)    Preferred Pharmacy (with phone number or street name): [facility] [phone] - [place], [place] - [address] AT [facility] [address] [facility] [address]  [address] [place] [address]  Phone:  [phone]  Fax:  [phone]     Agent: Please be advised that RX refills may take up to 3 business days. We ask that you follow-up with your pharmacy.

## 2021-06-06 ENCOUNTER — Other Ambulatory Visit: Payer: Self-pay

## 2021-06-06 ENCOUNTER — Other Ambulatory Visit: Payer: Self-pay | Admitting: *Deleted

## 2021-06-06 DIAGNOSIS — I4811 Longstanding persistent atrial fibrillation: Secondary | ICD-10-CM

## 2021-06-06 NOTE — Telephone Encounter (Signed)
Eliquis 5mg  paper refill request received. Patient is 75 years old, weight-114kg, Crea-1.30 on 04/12/21, Diagnosis-Afib, and last seen by [place], [facility] on 02/23/2020-NEEDS APPT. Dose is appropriate based on dosing criteria. Will send in refill to requested pharmacy.    Will send schedulers a message to reach out.

## 2021-06-07 MED ORDER — ELIQUIS 5 MG PO TABS: 1.0000 | ORAL_TABLET | Freq: Two times a day (BID) | ORAL | 0 refills | Status: DC

## 2021-06-07 NOTE — Telephone Encounter (Signed)
Pt scheduled follow-up appt with Dr Graciela Husbands on 07/25/21.  Will refill Eliquis to get pt to upcoming appt.  Pt must keep appt for future refills.

## 2021-07-04 ENCOUNTER — Other Ambulatory Visit: Payer: Self-pay | Admitting: Internal Medicine

## 2021-07-06 ENCOUNTER — Telehealth: Payer: Self-pay | Admitting: Internal Medicine

## 2021-07-06 DIAGNOSIS — G8929 Other chronic pain: Secondary | ICD-10-CM

## 2021-07-06 MED ORDER — HYDROCODONE-ACETAMINOPHEN 10-325 MG PO TABS: 1.0000 | ORAL_TABLET | Freq: Two times a day (BID) | ORAL | 0 refills | Status: DC | PRN

## 2021-07-06 NOTE — Telephone Encounter (Signed)
Requesting: Hydrocodone 10-325mg  Contract: 04/12/2021 UDS: 04/12/2021 Last Visit: 04/12/2021 Next Visit: 10/18/2021 Last Refill: 06/02/2021 #60 and 0RF  Please Advise

## 2021-07-06 NOTE — Telephone Encounter (Signed)
PDMP okay, Rx sent x2 

## 2021-07-06 NOTE — Telephone Encounter (Signed)
Medication: HYDROcodone-acetaminophen (NORCO) 10-325 MG tablet [[id number]]      Has the patient contacted their pharmacy? No. (If no, request that the patient contact the pharmacy for the refill.) (If yes, when and what did the pharmacy advise?)     Preferred Pharmacy (with phone number or street name):  [facility] [phone] - [place], [place] - [address] AT [facility] [address] [facility] [address]  [address] [place] [address]  Phone:  [phone]  Fax:  [phone]     Agent: Please be advised that RX refills may take up to 3 business days. We ask that you follow-up with your pharmacy.

## 2021-07-25 ENCOUNTER — Ambulatory Visit: Payer: Medicare Other | Admitting: Internal Medicine

## 2021-08-07 ENCOUNTER — Other Ambulatory Visit: Payer: Self-pay | Admitting: Internal Medicine

## 2021-08-08 ENCOUNTER — Other Ambulatory Visit: Payer: Self-pay

## 2021-08-08 ENCOUNTER — Telehealth: Payer: Self-pay

## 2021-08-08 DIAGNOSIS — G8929 Other chronic pain: Secondary | ICD-10-CM

## 2021-08-08 DIAGNOSIS — M549 Dorsalgia, unspecified: Secondary | ICD-10-CM

## 2021-08-08 MED ORDER — NITROGLYCERIN 0.4 MG SL SUBL: 0.4000 mg | SUBLINGUAL_TABLET | SUBLINGUAL | 3 refills | Status: DC | PRN

## 2021-08-08 MED ORDER — HYDROCODONE-ACETAMINOPHEN 10-325 MG PO TABS: 1.0000 | ORAL_TABLET | Freq: Two times a day (BID) | ORAL | 0 refills | Status: DC | PRN

## 2021-08-08 MED ORDER — DILTIAZEM HCL ER COATED BEADS 120 MG PO CP24: ORAL_CAPSULE | ORAL | 1 refills | Status: DC

## 2021-08-08 NOTE — Telephone Encounter (Signed)
Requesting: NORCO Contract:04/18/2021 UDS:04/12/2021 Last Visit:04/12/2021 Next Visit:10/18/2021 Last Refill:07/06/2021  Please Advise

## 2021-08-08 NOTE — Telephone Encounter (Signed)
PDMP okay, Rx sent x2 

## 2021-08-13 ENCOUNTER — Other Ambulatory Visit: Payer: Self-pay | Admitting: Internal Medicine

## 2021-08-24 ENCOUNTER — Other Ambulatory Visit: Payer: Self-pay | Admitting: Internal Medicine

## 2021-09-08 ENCOUNTER — Telehealth: Payer: Self-pay

## 2021-09-08 DIAGNOSIS — G8929 Other chronic pain: Secondary | ICD-10-CM

## 2021-09-08 MED ORDER — HYDROCODONE-ACETAMINOPHEN 10-325 MG PO TABS: 1.0000 | ORAL_TABLET | Freq: Two times a day (BID) | ORAL | 0 refills | Status: DC | PRN

## 2021-09-08 NOTE — Telephone Encounter (Signed)
Requesting: norco 10-325 mg Contract:04/12/21 UDS:04/12/21 Last Visit:04/12/21 Next Visit:10/18/21 Last Refill:08/08/21  Please Advise

## 2021-09-08 NOTE — Telephone Encounter (Signed)
PDMP okay, Rx sent x2 

## 2021-09-15 ENCOUNTER — Telehealth: Payer: Self-pay | Admitting: Internal Medicine

## 2021-09-15 NOTE — Telephone Encounter (Signed)
Left message for patient to call back and schedule Medicare Annual Wellness Visit (AWV) in office.   If not able to come in office, please offer to do virtually or by telephone.  Left office number and my jabber [phone].  Last AWV:04/15/2017  Please schedule at anytime with Nurse Health Advisor.

## 2021-09-22 ENCOUNTER — Other Ambulatory Visit: Payer: Self-pay | Admitting: Internal Medicine

## 2021-09-22 DIAGNOSIS — I4811 Longstanding persistent atrial fibrillation: Secondary | ICD-10-CM

## 2021-09-25 NOTE — Telephone Encounter (Addendum)
Prescription refill request for Eliquis received. Indication: afib  Last office visit: 02/23/2020, [place] Scr: 1.30, 04/12/2021 Age: 75 yo  Weight: 114 kg   Due to see Graciela Husbands, 10/23/2021.  Sent in refill for Eliquis 180 tabs, 1 refill.  Called [facility], informed them to change refill to Eliquis: 180 tabs and pt needs to keep appointment with cardiologist for future refills.

## 2021-10-09 ENCOUNTER — Telehealth: Payer: Self-pay | Admitting: Internal Medicine

## 2021-10-09 DIAGNOSIS — M549 Dorsalgia, unspecified: Secondary | ICD-10-CM

## 2021-10-09 DIAGNOSIS — G8929 Other chronic pain: Secondary | ICD-10-CM

## 2021-10-09 MED ORDER — HYDROCODONE-ACETAMINOPHEN 10-325 MG PO TABS: 1.0000 | ORAL_TABLET | Freq: Two times a day (BID) | ORAL | 0 refills | Status: DC | PRN

## 2021-10-09 NOTE — Telephone Encounter (Signed)
Medication: HYDROcodone-acetaminophen (NORCO) 10-325 MG tablet   Has the patient contacted their pharmacy? No. (If no, request that the patient contact the pharmacy for the refill.) (If yes, when and what did the pharmacy advise?)  Preferred Pharmacy (with phone number or street name): [facility] [phone] - [place], [place] - [address] AT [facility] [address] [facility] [address]  [address] [place] [address]  Phone:  [phone]  Fax:  [phone]  Agent: Please be advised that RX refills may take up to 3 business days. We ask that you follow-up with your pharmacy.

## 2021-10-09 NOTE — Telephone Encounter (Signed)
PDMP okay, Rx sent x2, he has an appointment with me later on this month.

## 2021-10-09 NOTE — Telephone Encounter (Signed)
Requesting: hydrocodone 10-325mg  Contract: 04/12/2021 UDS: 04/12/2021 Last Visit: 04/12/2021 Next Visit: 10/18/2021 Last Refill: 09/08/2021 #60 and 0RF  Please Advise

## 2021-10-18 ENCOUNTER — Encounter: Payer: Self-pay | Admitting: Internal Medicine

## 2021-10-18 ENCOUNTER — Ambulatory Visit (INDEPENDENT_AMBULATORY_CARE_PROVIDER_SITE_OTHER): Payer: Medicare Other | Admitting: Internal Medicine

## 2021-10-18 ENCOUNTER — Other Ambulatory Visit: Payer: Self-pay

## 2021-10-18 VITALS — BP 118/86 | HR 82 | Temp 97.8°F | Resp 18 | Ht 70.0 in | Wt 244.0 lb

## 2021-10-18 DIAGNOSIS — I10 Essential (primary) hypertension: Secondary | ICD-10-CM

## 2021-10-18 DIAGNOSIS — E785 Hyperlipidemia, unspecified: Secondary | ICD-10-CM

## 2021-10-18 DIAGNOSIS — E079 Disorder of thyroid, unspecified: Secondary | ICD-10-CM | POA: Diagnosis not present

## 2021-10-18 DIAGNOSIS — Z0000 Encounter for general adult medical examination without abnormal findings: Secondary | ICD-10-CM

## 2021-10-18 DIAGNOSIS — L989 Disorder of the skin and subcutaneous tissue, unspecified: Secondary | ICD-10-CM

## 2021-10-18 DIAGNOSIS — Z23 Encounter for immunization: Secondary | ICD-10-CM

## 2021-10-18 DIAGNOSIS — R739 Hyperglycemia, unspecified: Secondary | ICD-10-CM

## 2021-10-18 NOTE — Patient Instructions (Addendum)
Recommend to proceed with the following vaccines at your pharmacy:  Shingrix (shingles) New Covid booster (bivalent)  See your dentist regularly  We are referring you to the dermatologist, if you are not contacted within 2 to 3 weeks please call the office  Check the  blood pressure once a month  BP GOAL is between 110/65 and  135/85. If it is consistently higher or lower, let me know      GO TO THE LAB : Get the blood work     GO TO THE FRONT DESK, PLEASE SCHEDULE YOUR APPOINTMENTS Come back for   a checkup in 6 months      "Living will", "Health Care Power of attorney": Advanced care planning  (If you already have a living will or healthcare power of attorney, please bring the copy to be scanned in your chart.)  Advance care planning is a process that supports adults in  understanding and sharing their preferences regarding future medical care.   The patient's preferences are recorded in documents called Advance Directives.    Advanced directives are completed (and can be modified at any time) while the patient is in full mental capacity.   The documentation should be available at all times to the patient, the family and the healthcare providers.  Bring in a copy to be scanned in your chart is an excellent idea and is recommended   This legal documents direct treatment decision making and/or appoint a surrogate to make the decision if the patient is not capable to do so.    Advance directives can be documented in many types of formats,  documents have names such as:  Lliving will  Durable power of attorney for healthcare (healthcare proxy or healthcare power of attorney)  Combined directives  Physician orders for life-sustaining treatment    More information at:  [email or web address]

## 2021-10-18 NOTE — Progress Notes (Signed)
Subjective:    Patient ID: Warren [name], male    DOB: [birth year], 75 y.o.   MRN: [id number]  DOS:  10/18/2021 Type of visit - description: CPX  Since the last visit, has no new concerns.  Review of Systems Continue with back pain despite taking hydrocodone. He denies any cough, no blood in the urine , no blood in the stools.   Other than above, a 14 point review of systems is negative      Past Medical History:  Diagnosis Date   GERD (gastroesophageal reflux disease) 01/01/2020   Hypertension    Hypothyroidism    Increased prostate specific antigen (PSA) velocity    Morbid obesity (HCC)    Persistent atrial fibrillation (HCC)    Sleep-disordered breathing    Supraventricular tachycardia (HCC)    a. 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  Tobacco Use   Smoking status: Every Day    Types: Cigars   Smokeless tobacco: Never   Tobacco comments:    HAS 2 LARGE CIGARS A DAY  Vaping Use   Vaping Use: Never used  Substance and Sexual Activity   Alcohol use: Yes    Alcohol/week: 0.0 standard drinks    Comment: RARE   Drug use: No   Sexual activity: Not on file  Other Topics Concern   Not on file  Social History Narrative   Lives by himself , has no family,  for emergency contact he provided today the number of a  neighbor [phone]  [place])            Social Determinants of Health   Financial Resource  Strain: Not on file  Food Insecurity: Not on file  Transportation Needs: Not on file  Physical Activity: Not on file  Stress: Not on file  Social Connections: Not on file  Intimate Partner Violence: Not on file    Allergies as of 10/18/2021   No Known Allergies      Medication List        Accurate as of October 18, 2021 11:59 PM. If you have any questions, ask your nurse or doctor.          CENTRUM SILVER PO Take 1 each by mouth daily.   co-enzyme Q-10 30 MG capsule Take 30 mg by mouth daily.   diltiazem 120 MG 24 hr capsule Commonly known as: CARDIZEM CD TAKE 1 CAPSULE(120 MG) BY MOUTH DAILY   Eliquis 5 MG Tabs tablet Generic drug: apixaban TAKE 1 TABLET BY MOUTH  TWICE DAILY   hydrochlorothiazide 25 MG tablet Commonly known as: HYDRODIURIL Take 1 tablet (25 mg total) by mouth daily as needed.   HYDROcodone-acetaminophen 10-325 MG tablet Commonly known as: NORCO Take 1 tablet by mouth 2 (two) times daily as needed for moderate  pain.   levothyroxine 100 MCG tablet Commonly known as: SYNTHROID TAKE 1 TABLET(100 MCG) BY MOUTH DAILY BEFORE BREAKFAST   metoprolol 200 MG 24 hr tablet Commonly known as: TOPROL-XL Take 1 tablet (200 mg total) by mouth daily.   nitroGLYCERIN 0.4 MG SL tablet Commonly known as: NITROSTAT Place 1 tablet (0.4 mg total) under the tongue every 5 (five) minutes x 3 doses as needed for chest pain.   omeprazole 40 MG capsule Commonly known as: PRILOSEC TAKE 1 CAPSULE(40 MG) BY MOUTH DAILY BEFORE BREAKFAST   potassium chloride SA 20 MEQ tablet Commonly known as: KLOR-CON Take 3 tablets (60 mEq total) by mouth daily.   traZODone 50 MG tablet Commonly known as: DESYREL TAKE 1 AND 1/2 TABLETS(75 MG) BY MOUTH AT BEDTIME AS NEEDED FOR SLEEP   TURMERIC PO Take 4 capsules by mouth 2 (two) times daily.           Objective:   Physical Exam BP 118/86 (BP Location: Right Arm, Patient Position: Sitting, Cuff Size: Normal)   Pulse  82   Temp 97.8 F (36.6 C) (Oral)   Resp 18   Ht 5\' 10"  (1.778 m)   Wt 244 lb (110.7 kg)   SpO2 93%   BMI 35.01 kg/m  General: Well developed, NAD, BMI noted Neck: No  thyromegaly  HEENT:  Normocephalic . Face symmetric, atraumatic Lungs:  CTA B Normal respiratory effort, no intercostal retractions, no accessory muscle use. Heart: Irregularly irregular. Abdomen:  Not distended, soft, non-tender. No rebound or rigidity.   Lower extremities: no pretibial edema bilaterally  Skin: Has a brown, irregular surface skin lesion at the left cheek.  Approximately 1 x 1.2 cm Neurologic:  alert & oriented X3.  Speech normal, gait appropriate for age and unassisted Strength symmetric and appropriate for age.  Psych: Cognition and judgment appear intact.  Cooperative with normal attention span and concentration.  Behavior appropriate. No anxious or depressed appearing.     Assessment     ASSESSMENT HTN Hypothyroidism GERD  SVT: Cardiac cath 2001: No CAD, ablation 2002 Persistent atrial fibrillation -- on Eliquis started 08-2015, declines DCCV Insomnia -- Xanax was dc 2015 d/t requiring increasing doses, was changed to Ativan: didn't work. Was switch to Ambien: He had drowsiness the following day. On trazodone prn H/o  Fatigue Sleep-disordered suspected OSA: declined to have a sleep study Increased PSA velocity: Consistently declined to see urology Obesity Back pain-- hydrocodone prn, rx by pcp    PLAN:  Here for CPX HTN: Seems well controlled, checking labs, continue present care. Hypothyroidism: On Synthroid, check TSH A. fib: No symptoms, seems to be tolerating anticoagulation well.  Checking a CBC. Insomnia: On trazodone, symptoms not completely well controlled. Chronic back pain: On hydrocodone 10 mg twice daily, we discussed decreasing hydrocodone dose, he is very reluctant, "I still hurt even with 10 mg twice daily".  Pain management referral?  Declined Skin lesion, R  cheek: See physical exam, refer to Derm RTC 6 months    This visit occurred during the SARS-CoV-2 public health emergency.  Safety protocols were in place, including screening questions prior to the visit, additional usage of staff PPE, and extensive cleaning of exam room while observing appropriate contact time as indicated for disinfecting solutions.

## 2021-10-19 ENCOUNTER — Encounter: Payer: Self-pay | Admitting: Internal Medicine

## 2021-10-19 LAB — CBC WITH DIFFERENTIAL/PLATELET
Basophils Absolute: 0.1 10*3/uL (ref 0.0–0.1)
Basophils Relative: 0.7 % (ref 0.0–3.0)
Eosinophils Absolute: 0.2 10*3/uL (ref 0.0–0.7)
Eosinophils Relative: 3.4 % (ref 0.0–5.0)
HCT: 42.1 % (ref 39.0–52.0)
Hemoglobin: 14 g/dL (ref 13.0–17.0)
Lymphocytes Relative: 20.8 % (ref 12.0–46.0)
Lymphs Abs: 1.5 10*3/uL (ref 0.7–4.0)
MCHC: 33.2 g/dL (ref 30.0–36.0)
MCV: 92.4 fl (ref 78.0–100.0)
Monocytes Absolute: 0.6 10*3/uL (ref 0.1–1.0)
Monocytes Relative: 8.6 % (ref 3.0–12.0)
Neutro Abs: 4.8 10*3/uL (ref 1.4–7.7)
Neutrophils Relative %: 66.5 % (ref 43.0–77.0)
Platelets: 182 10*3/uL (ref 150.0–400.0)
RBC: 4.55 Mil/uL (ref 4.22–5.81)
RDW: 13.8 % (ref 11.5–15.5)
WBC: 7.2 10*3/uL (ref 4.0–10.5)

## 2021-10-19 LAB — BASIC METABOLIC PANEL
BUN: 13 mg/dL (ref 6–23)
CO2: 31 mEq/L (ref 19–32)
Calcium: 9 mg/dL (ref 8.4–10.5)
Chloride: 102 mEq/L (ref 96–112)
Creatinine, Ser: 1.3 mg/dL (ref 0.40–1.50)
GFR: 53.32 mL/min — ABNORMAL LOW (ref >60.00)
Glucose, Bld: 97 mg/dL (ref 70–99)
Potassium: 4.1 mEq/L (ref 3.5–5.1)
Sodium: 142 mEq/L (ref 135–145)

## 2021-10-19 LAB — TSH: TSH: 2.97 u[IU]/mL (ref 0.35–5.50)

## 2021-10-19 LAB — HEMOGLOBIN A1C: Hgb A1c MFr Bld: 5.6 % (ref 4.6–6.5)

## 2021-10-19 NOTE — Assessment & Plan Note (Signed)
Here for CPX HTN: Seems well controlled, checking labs, continue present care. Hypothyroidism: On Synthroid, check TSH A. fib: No symptoms, seems to be tolerating anticoagulation well.  Checking a CBC. Insomnia: On trazodone, symptoms not completely well controlled. Chronic back pain: On hydrocodone 10 mg twice daily, we discussed decreasing hydrocodone dose, he is very reluctant, "I still hurt even with 10 mg twice daily".  Pain management referral?  Declined Skin lesion, R cheek: See physical exam, refer to Derm RTC 6 months

## 2021-10-19 NOTE — Assessment & Plan Note (Signed)
-  Td 2019 -  pneumonia shot- 2011 ;  prevnar: 2015 - covid vax booster rec  - shingrex recommended  - flu shot : today   - CCS: Never had a Cscope, again declined a screening -Prostate cancer screening: Strongly declined -Lung cancer screening: Declined Benefit of screening discussed again today. Counseled about : diet, exercise. Still smokes 2 cigars daily, recommend routine dental exams.  Not ready to quit. Labs: BMP, CBC, A1c, TSH, free T4

## 2021-10-23 ENCOUNTER — Ambulatory Visit (INDEPENDENT_AMBULATORY_CARE_PROVIDER_SITE_OTHER): Payer: Medicare Other | Admitting: Internal Medicine

## 2021-10-23 ENCOUNTER — Other Ambulatory Visit: Payer: Self-pay

## 2021-10-23 ENCOUNTER — Encounter: Payer: Self-pay | Admitting: Internal Medicine

## 2021-10-23 VITALS — BP 132/92 | HR 87 | Ht 70.0 in | Wt 244.8 lb

## 2021-10-23 DIAGNOSIS — I4811 Longstanding persistent atrial fibrillation: Secondary | ICD-10-CM

## 2021-10-23 NOTE — Patient Instructions (Signed)
Medication Instructions:  ?Your physician recommends that you continue on your current medications as directed. Please refer to the Current Medication list given to you today. ? ?*If you need a refill on your cardiac medications before your next appointment, please call your pharmacy* ? ? ?Lab Work: ?None ordered. ? ?If you have labs (blood work) drawn today and your tests are completely normal, you will receive your results only by: ?MyChart Message (if you have MyChart) OR ?A paper copy in the mail ?If you have any lab test that is abnormal or we need to change your treatment, we will call you to review the results. ? ? ?Testing/Procedures: ?None ordered. ? ? ? ?Follow-Up: ?At [facility], you and your health needs are our priority.  As part of our continuing mission to provide you with exceptional heart care, we have created designated Provider Care Teams.  These Care Teams include your primary Cardiologist (physician) and Advanced Practice Providers (APPs -  Physician Assistants and Nurse Practitioners) who all work together to provide you with the care you need, when you need it. ? ?We recommend signing up for the patient portal called "MyChart".  Sign up information is provided on this After Visit Summary.  MyChart is used to connect with patients for Virtual Visits (Telemedicine).  Patients are able to view lab/test results, encounter notes, upcoming appointments, etc.  Non-urgent messages can be sent to your provider as well.   ?To learn more about what you can do with MyChart, go to [email or web address].   ? ?Your next appointment:   ?Follow up as needed with Dr Klein ?

## 2021-10-23 NOTE — Progress Notes (Signed)
HPI  Warren [name] is a 75 y.o. male Seen in followup for atrial fibrillation in the context of hypertension. He takes flecainide Cardizem and aspirin was discontinued with the initiation of apixoban.  The patient was found to be in atrial fibrillation and saw AS NP. Echocardiogram  12/16 demonstrated normal LV function and discordant data on the left atrium (42/1.9/47)  He has repeatedly declined cardioversion, and A Seiler NP intercurrently discontinued his flecainide   he has been diagnosed with hypothyroidism and Synthroid has been started.   Today, he is doing good. He reports feeling tired after changing his exercise routine.He used to walk 5 miles a day but now only walks 2 miles a day. He has gained weight and reports maximum weight in 240slbs.  He reports back pain at night. He has to sleep in a certain position to alleviate the pain. He reports occasionally snoring and cannot sleep on his L side because he feels the pressure on his heart.  He does not bleed on his medications. He still smokes cigars.  The patient denies nocturnal dyspnea, orthopnea or peripheral edema.  There have been no palpitations, lightheadedness or syncope.  Complains of back pain and DOE   DATE TEST EF   12/16 Echo  55-60 %    Thromboembolic risk factors ( age -54, HTN-1) for a CHADSVASc Score of >=3    Date Cr K Hgb  5/19 1.42 4.1     10/22 1.30 4.1 14.0      Past Medical History:  Diagnosis Date   GERD (gastroesophageal reflux disease) 01/01/2020   Hypertension    Hypothyroidism    Increased prostate specific antigen (PSA) velocity    Morbid obesity (HCC)    Persistent atrial fibrillation (HCC)    Sleep-disordered breathing    Supraventricular tachycardia (HCC)    a. s/p concealed left lateral pathway ablation 2002 Dr Graciela Husbands    Past Surgical History:  Procedure Laterality Date   ABLATION  2002   concealed left lateral pathway ablation by Dr Graciela Husbands   APPENDECTOMY     CARDIAC  CATHETERIZATION  2001   NO CAD   NM MYOVIEW LTD     12-09   TONSILLECTOMY      Current Outpatient Medications  Medication Sig Dispense Refill   apixaban (ELIQUIS) 5 MG TABS tablet TAKE 1 TABLET BY MOUTH  TWICE DAILY 180 tablet 1   co-enzyme Q-10 30 MG capsule Take 30 mg by mouth daily.     diltiazem (CARDIZEM CD) 120 MG 24 hr capsule TAKE 1 CAPSULE(120 MG) BY MOUTH DAILY 90 capsule 1   hydrochlorothiazide (HYDRODIURIL) 25 MG tablet Take 1 tablet (25 mg total) by mouth daily as needed. 90 tablet 0   HYDROcodone-acetaminophen (NORCO) 10-325 MG tablet Take 1 tablet by mouth 2 (two) times daily as needed for moderate pain. 60 tablet 0   levothyroxine (SYNTHROID) 100 MCG tablet TAKE 1 TABLET(100 MCG) BY MOUTH DAILY BEFORE BREAKFAST 90 tablet 1   metoprolol (TOPROL-XL) 200 MG 24 hr tablet Take 1 tablet (200 mg total) by mouth daily. 90 tablet 1   Multiple Vitamins-Minerals (CENTRUM SILVER PO) Take 1 each by mouth daily.     nitroGLYCERIN (NITROSTAT) 0.4 MG SL tablet Place 1 tablet (0.4 mg total) under the tongue every 5 (five) minutes x 3 doses as needed for chest pain. 25 tablet 3   omeprazole (PRILOSEC) 40 MG capsule TAKE 1 CAPSULE(40 MG) BY MOUTH DAILY BEFORE BREAKFAST 90 capsule 1  potassium chloride SA (KLOR-CON) 20 MEQ tablet Take 3 tablets (60 mEq total) by mouth daily. 270 tablet 1   traZODone (DESYREL) 50 MG tablet TAKE 1 AND 1/2 TABLETS(75 MG) BY MOUTH AT BEDTIME AS NEEDED FOR SLEEP 135 tablet 1   TURMERIC PO Take 4 capsules by mouth 2 (two) times daily.     No current facility-administered medications for this visit.    No Known Allergies  Review of Systems negative except from HPI and PMH  Physical Exam BP (!) 132/92   Pulse 87   Ht 5\' 10"  (1.778 m)   Wt 244 lb 12.8 oz (111 kg)   SpO2 98%   BMI 35.13 kg/m  Well developed and nourished in no acute distress HENT normal Neck supple with JVP-  flat   Clear Regular rate and rhythm, no murmurs or gallops Abd-soft with  active BS No Clubbing cyanosis edema Skin-warm and dry A & Oriented  Grossly normal sensory and motor function  ECG atrial fib @ 887 -/09/41   Assessment and  Plan  Permanent  atrial fibrillation  Hypertension  Sleep-disordered breathing  Morbidly obese    On Anticoagulant for thromboembolic risk reduction.  No bleeding issues.  Continue Apixoban   at 5mg  bid  BP reasonalby well controlled  continue ; last office blood pressure was 118/86.  We will continue hydrochlorothiazide 25, diltiazem 120 metoprolol 200.  Atrial fibrillation is permanent.  Rate control was accomplished with diltiazem 120 and metoprolol 200..   Is stable.  He will follow-up with Dr. POTS we will see him again as needed  10-07-1987 as a scribe for , MD.,have documented all relevant documentation on the behalf of Sherin Quarry, MD,as directed by  Sherryl Manges, MD while in the presence of Sherryl Manges, MD.  I, Sherryl Manges, MD, have reviewed all documentation for this visit. The documentation on 10/23/21 for the exam, diagnosis, procedures, and orders are all accurate and complete.

## 2021-11-02 ENCOUNTER — Other Ambulatory Visit: Payer: Self-pay | Admitting: Internal Medicine

## 2021-11-28 ENCOUNTER — Other Ambulatory Visit: Payer: Self-pay | Admitting: Internal Medicine

## 2021-11-28 NOTE — Telephone Encounter (Signed)
Medication:  hydrochlorothiazide (HYDRODIURIL) 25 MG tablet  Has the patient contacted their pharmacy? Yes.   (If no, request that the patient contact the pharmacy for the refill.) (If yes, when and what did the pharmacy advise?)  Preferred Pharmacy (with phone number or street name): [facility] [phone] - [place], [place] - [address] AT [facility] [address] [facility] [address]  [address] [place] [address]  Phone:  [phone]  Fax:  [phone]   Agent: Please be advised that RX refills may take up to 3 business days. We ask that you follow-up with your pharmacy.

## 2021-12-13 ENCOUNTER — Other Ambulatory Visit: Payer: Self-pay | Admitting: Internal Medicine

## 2021-12-13 DIAGNOSIS — I4811 Longstanding persistent atrial fibrillation: Secondary | ICD-10-CM

## 2021-12-14 NOTE — Telephone Encounter (Signed)
Prescription refill request for Eliquis received. Indication: Afib  Last office visit:10/23/21 Graciela Husbands)  Scr: 1.30 (10/18/21)  Age: 75 Weight: 111kg  Appropriate dose and refill sent to requested pharmacy.

## 2022-01-10 ENCOUNTER — Other Ambulatory Visit: Payer: Self-pay | Admitting: Internal Medicine

## 2022-01-11 ENCOUNTER — Telehealth: Payer: Self-pay | Admitting: Internal Medicine

## 2022-01-11 DIAGNOSIS — M549 Dorsalgia, unspecified: Secondary | ICD-10-CM

## 2022-01-11 DIAGNOSIS — G8929 Other chronic pain: Secondary | ICD-10-CM

## 2022-01-11 MED ORDER — HYDROCODONE-ACETAMINOPHEN 10-325 MG PO TABS: 1.0000 | ORAL_TABLET | Freq: Two times a day (BID) | ORAL | 0 refills | Status: DC | PRN

## 2022-01-11 NOTE — Telephone Encounter (Signed)
Requesting: hydrocodone 10-325mg  Contract: 04/12/2021 UDS: 04/12/2021 Last Visit: 10/18/2021 Next Visit: None Last Refill: 10/09/2021 #60 and 0RF  Please Advise

## 2022-01-11 NOTE — Telephone Encounter (Signed)
PDMP okay. Next office visit should be by April. Rx sent.

## 2022-01-11 NOTE — Telephone Encounter (Signed)
Medication: HYDROcodone-acetaminophen (NORCO) 10-325 MG tablet ° °Has the patient contacted their pharmacy? Yes.   °(If no, request that the patient contact the pharmacy for the refill.) °(If yes, when and what did the pharmacy advise?) ° °Preferred Pharmacy (with phone number or street name):  °[id number] - [place], [place] - [address] AT [address]  °[address], [place] [address]  °Phone:  [phone]  Fax:  [phone]  ° °Agent: Please be advised that RX refills may take up to 3 business days. We ask that you follow-up with your pharmacy.  °

## 2022-01-31 ENCOUNTER — Other Ambulatory Visit: Payer: Self-pay | Admitting: Internal Medicine

## 2022-02-09 ENCOUNTER — Telehealth: Payer: Self-pay | Admitting: Internal Medicine

## 2022-02-09 DIAGNOSIS — G8929 Other chronic pain: Secondary | ICD-10-CM

## 2022-02-09 MED ORDER — HYDROCODONE-ACETAMINOPHEN 10-325 MG PO TABS: 1.0000 | ORAL_TABLET | Freq: Two times a day (BID) | ORAL | 0 refills | Status: DC | PRN

## 2022-02-09 NOTE — Telephone Encounter (Signed)
Left message for patient to call back and schedule Medicare Annual Wellness Visit (AWV) in office.   If not able to come in office, please offer to do virtually or by telephone.  Left office number and my jabber #[phone].  Last AWV:04/15/2017  Please schedule at anytime with Nurse Health Advisor.   

## 2022-02-09 NOTE — Telephone Encounter (Signed)
Next visit should be in April, PDMP okay, Rx sent

## 2022-02-09 NOTE — Telephone Encounter (Signed)
Requesting: hydrocodone 10-325mg   Contract: 04/12/2021 UDS: 04/12/2021 Last Visit: 10/18/2021 Next Visit: None Last Refill: 01/11/2022 #60 and 0RF  Please Advise

## 2022-02-09 NOTE — Telephone Encounter (Signed)
Medication: HYDROcodone-acetaminophen (NORCO) 10-325 MG tablet  Has the patient contacted their pharmacy? Yes.   (If no, request that the patient contact the pharmacy for the refill.) (If yes, when and what did the pharmacy advise?)  Preferred Pharmacy (with phone number or street name):  [facility] [phone] - [place], [place] - [address] AT [facility] [address] [facility] [address]  [address] [place] [address]  Phone:  [phone]  Fax:  [phone]   Agent: Please be advised that RX refills may take up to 3 business days. We ask that you follow-up with your pharmacy.

## 2022-02-22 ENCOUNTER — Encounter: Payer: Self-pay | Admitting: Internal Medicine

## 2022-03-01 ENCOUNTER — Telehealth: Payer: Self-pay | Admitting: Internal Medicine

## 2022-03-01 NOTE — Telephone Encounter (Signed)
Left message for patient to call back and schedule Medicare Annual Wellness Visit (AWV) in office.   If not able to come in office, please offer to do virtually or by telephone.  Left office number and my jabber #[phone].  Last AWV:04/15/2017  Please schedule at anytime with Nurse Health Advisor.   

## 2022-03-13 ENCOUNTER — Other Ambulatory Visit: Payer: Self-pay

## 2022-03-13 ENCOUNTER — Other Ambulatory Visit: Payer: Self-pay | Admitting: Internal Medicine

## 2022-03-13 DIAGNOSIS — G8929 Other chronic pain: Secondary | ICD-10-CM

## 2022-03-13 MED ORDER — HYDROCODONE-ACETAMINOPHEN 10-325 MG PO TABS: 1.0000 | ORAL_TABLET | Freq: Two times a day (BID) | ORAL | 0 refills | Status: DC | PRN

## 2022-03-13 MED ORDER — OMEPRAZOLE 40 MG PO CPDR: DELAYED_RELEASE_CAPSULE | ORAL | 1 refills | Status: DC

## 2022-03-13 NOTE — Telephone Encounter (Signed)
Pt would like to know if he's do for an appointment to refill Hydrocodone.Marland Kitchen ?

## 2022-03-13 NOTE — Telephone Encounter (Signed)
Omeprazole sent to [facility].  ? ?Dr Larose Kells- please see refill request for hydrocodone. ?

## 2022-03-13 NOTE — Telephone Encounter (Signed)
Requesting: hydrocodone 10-325mg   ?Contract: 04/12/2021 ?UDS: 04/12/2021 ?Last Visit: 10/18/2021 ?Next Visit: None ?Last Refill: 02/09/2022 #60 and 0RF ? ?Please Advise ? ?

## 2022-03-13 NOTE — Telephone Encounter (Signed)
Pt due for visit next month, 4/23, will you schedule at his convenience please?  ?

## 2022-03-13 NOTE — Telephone Encounter (Signed)
omeprazole (PRILOSEC) 40 MG capsule [[id number]]  ?[facility] ([facility] ) - [place], [place] - [phone] [address]  ?[address], [place] [address]  ?Phone:  [phone]  Fax:  [phone]  ?DEA #:  -- ? ? ?Pt scheduled! 5/24 @ 140 ? ?

## 2022-03-13 NOTE — Telephone Encounter (Signed)
Pdmp ok, rx sent x 2 

## 2022-03-13 NOTE — Telephone Encounter (Signed)
Pt is requesting medication refilled  ? ?HYDROcodone-acetaminophen (NORCO) 10-325 MG tablet [[id number]] ?[facility] [phone] - [place], [place] - [address] AT [address]  ?[address],  [place] [address]  ?Phone:  [phone]  Fax:  [phone]  ? ? ?

## 2022-04-10 ENCOUNTER — Telehealth: Payer: Self-pay | Admitting: Internal Medicine

## 2022-04-10 DIAGNOSIS — G8929 Other chronic pain: Secondary | ICD-10-CM

## 2022-04-10 MED ORDER — HYDROCODONE-ACETAMINOPHEN 10-325 MG PO TABS: 1.0000 | ORAL_TABLET | Freq: Two times a day (BID) | ORAL | 0 refills | Status: DC | PRN

## 2022-04-10 NOTE — Telephone Encounter (Signed)
Medication:  ?HYDROcodone-acetaminophen (NORCO) 10-325 MG tablet ?Has the patient contacted their pharmacy? No. ? ?Preferred Pharmacy (with phone number or street name):  ?[facility] [phone] - [place], [place] - [address] AT [address]  ?[address], [place] [address]  ?Phone:  [phone]  Fax:  [phone]  ? ?Agent: Please be advised that RX refills may take up to 3 business days. We ask that you follow-up with your pharmacy.  ?

## 2022-04-10 NOTE — Telephone Encounter (Signed)
Pdmp ok, rx sent  ? ?

## 2022-04-10 NOTE — Telephone Encounter (Signed)
Requesting: hydrocodone 10-325mg   ?Contract: 04/12/21 ?UDS: 04/12/2021 ?Last Visit: 10/18/21 ?Next Visit: 05/16/22 ?Last Refill: 03/13/22 #60 and 0RF ? ?Please Advise ? ?

## 2022-04-18 ENCOUNTER — Other Ambulatory Visit: Payer: Self-pay | Admitting: Internal Medicine

## 2022-04-21 ENCOUNTER — Other Ambulatory Visit: Payer: Self-pay | Admitting: Internal Medicine

## 2022-04-30 ENCOUNTER — Other Ambulatory Visit: Payer: Self-pay | Admitting: Internal Medicine

## 2022-05-01 NOTE — Progress Notes (Deleted)
Subjective:   Warren [name] is a 76 y.o. male who presents for Medicare Annual/Subsequent preventive examination.  Review of Systems    ***       Objective:    There were no vitals filed for this visit. There is no height or weight on file to calculate BMI.     11/14/2018    9:22 PM 11/13/2018    7:47 PM 04/15/2017    1:29 PM  Advanced Directives  Does Patient Have a Medical Advance Directive? No No No  Would patient like information on creating a medical advance directive?  No - Patient declined No - Patient declined    Current Medications (verified) Outpatient Encounter Medications as of 05/02/2022  Medication Sig   diltiazem (CARDIZEM CD) 120 MG 24 hr capsule TAKE 1 CAPSULE BY MOUTH  DAILY   co-enzyme Q-10 30 MG capsule Take 30 mg by mouth daily.   ELIQUIS 5 MG TABS tablet TAKE 1 TABLET BY MOUTH  TWICE DAILY PLEASE KEEP  CARDIOLOGIST APPOINTMENT   hydrochlorothiazide (HYDRODIURIL) 25 MG tablet TAKE 1 TABLET(25 MG) BY MOUTH DAILY AS NEEDED   HYDROcodone-acetaminophen (NORCO) 10-325 MG tablet Take 1 tablet by mouth 2 (two) times daily as needed for moderate pain.   levothyroxine (SYNTHROID) 100 MCG tablet TAKE 1 TABLET(100 MCG) BY MOUTH DAILY BEFORE BREAKFAST   metoprolol (TOPROL-XL) 200 MG 24 hr tablet TAKE 1 TABLET(200 MG) BY MOUTH DAILY   Multiple Vitamins-Minerals (CENTRUM SILVER PO) Take 1 each by mouth daily.   nitroGLYCERIN (NITROSTAT) 0.4 MG SL tablet Place 1 tablet (0.4 mg total) under the tongue every 5 (five) minutes x 3 doses as needed for chest pain.   omeprazole (PRILOSEC) 40 MG capsule TAKE 1 CAPSULE(40 MG) BY MOUTH DAILY BEFORE BREAKFAST   potassium chloride SA (KLOR-CON M) 20 MEQ tablet TAKE 3 TABLETS(60 MEQ) BY MOUTH DAILY   traZODone (DESYREL) 50 MG tablet TAKE 1 AND 1/2 TABLETS(75 MG) BY MOUTH AT BEDTIME AS NEEDED FOR SLEEP   TURMERIC PO Take 4 capsules by mouth 2 (two) times daily.   No facility-administered encounter medications on file as of  05/02/2022.    Allergies (verified) Patient has no known allergies.   History: Past Medical History:  Diagnosis Date   GERD (gastroesophageal reflux disease) 01/01/2020   Hypertension    Hypothyroidism    Increased prostate specific antigen (PSA) velocity    Morbid obesity (HCC)    Persistent atrial fibrillation (HCC)    Sleep-disordered breathing    Supraventricular tachycardia (HCC)    a. s/p concealed left lateral pathway ablation 2002 Dr Graciela Husbands   Past Surgical History:  Procedure Laterality Date   ABLATION  2002   concealed left lateral pathway ablation by Dr Graciela Husbands   APPENDECTOMY     CARDIAC CATHETERIZATION  2001   NO CAD   NM MYOVIEW LTD     12-09   TONSILLECTOMY     Family History  Adopted: Yes  Family history unknown: Yes   Social History   Socioeconomic History   Marital status: Single    Spouse name: Not on file   Number of children: 0   Years of education: Not on file   Highest education level: Not on file  Occupational History   Occupation: not working, Solicitor, 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  Tobacco Use   Smoking status: Every Day    Types: Cigars   Smokeless tobacco: Never   Tobacco comments:  HAS 2 LARGE CIGARS A DAY  Vaping Use   Vaping Use: Never used  Substance and Sexual Activity   Alcohol use: Yes    Alcohol/week: 0.0 standard drinks    Comment: RARE   Drug use: No   Sexual activity: Not on file  Other Topics Concern   Not on file  Social History Narrative   Lives by himself , has no family,  for emergency contact he provided today the number of a  neighbor [phone]  [place])            Social Determinants of Health   Financial Resource Strain: Not on file  Food Insecurity: Not on file  Transportation Needs: Not on file  Physical Activity: Not on file  Stress: Not on file  Social Connections: Not on file    Tobacco Counseling Ready to quit: Not Answered Counseling given: Not Answered Tobacco  comments: HAS 2 LARGE CIGARS A DAY   Clinical Intake:                 Diabetic?No         Activities of Daily Living     View : No data to display.          Patient Care Team: Wanda Plump, MD as PCP - General  Indicate any recent Medical Services you may have received from other than [facility] providers in the past year (date may be approximate).     Assessment:   This is a routine wellness examination for Warren [name].  Hearing/Vision screen No results found.  Dietary issues and exercise activities discussed:     Goals Addressed   None    Depression Screen    10/18/2021   12:54 PM 04/12/2021    3:16 PM 08/17/2020    1:59 PM 04/28/2018    4:02 PM 04/15/2017    1:29 PM 07/24/2016   10:46 AM 03/13/2016   11:35 AM  PHQ 2/9 Scores  PHQ - 2 Score 0 0 0 0 0 0 0    Fall Risk    10/18/2021   12:54 PM 04/12/2021    3:15 PM 08/17/2020    1:59 PM 04/28/2018    4:02 PM 04/15/2017    1:29 PM  Fall Risk   Falls in the past year? 0 0 0 No No  Number falls in past yr: 0 0 0    Injury with Fall? 0 0 0    Follow up Falls evaluation completed  Falls evaluation completed      FALL RISK PREVENTION PERTAINING TO THE HOME:  Any stairs in or around the home? {YES/NO:21197} If so, are there any without handrails? {YES/NO:21197} Home free of loose throw rugs in walkways, pet beds, electrical cords, etc? {YES/NO:21197} Adequate lighting in your home to reduce risk of falls? {YES/NO:21197}  ASSISTIVE DEVICES UTILIZED TO PREVENT FALLS:  Life alert? {YES/NO:21197} Use of a cane, walker or w/c? {YES/NO:21197} Grab bars in the bathroom? {YES/NO:21197} Shower chair or bench in shower? {YES/NO:21197} Elevated toilet seat or a handicapped toilet? {YES/NO:21197}  TIMED UP AND GO:  Was the test performed? {YES/NO:21197}.  Length of time to ambulate 10 feet: *** sec.   {Appearance of UJWJ:1914782}  Cognitive Function:        Immunizations Immunization History   Administered Date(s) Administered   Fluad Quad(high Dose 65+) 01/01/2020, 10/18/2021   Influenza Whole 12/24/2005, 10/12/2008, 12/13/2009, 09/13/2010   Influenza, High Dose Seasonal PF 10/31/2015, 10/16/2017, 11/28/2018   Influenza,inj,Quad PF,6+  Mos 10/25/2014   PFIZER(Purple Top)SARS-COV-2 Vaccination 07/18/2020, 08/09/2020, 02/15/2021   Pneumococcal Conjugate-13 04/21/2014   Pneumococcal Polysaccharide-23 12/14/2010, 10/16/2017   Td 10/12/2008, 11/28/2018    TDAP status: Up to date  Flu Vaccine status: Up to date  Pneumococcal vaccine status: Up to date  {Covid-19 vaccine status:2101808}  Qualifies for Shingles Vaccine? {YES/NO:21197}  Zostavax completed {YES/NO:21197}  {Shingrix Completed?:2101804}  Screening Tests Health Maintenance  Topic Date Due   Zoster Vaccines- Shingrix (1 of 2) Never done   COVID-19 Vaccine (4 - Booster for Pfizer series) 04/12/2021   INFLUENZA VACCINE  07/24/2022   TETANUS/TDAP  11/28/2028   Pneumonia Vaccine 80+ Years old  Completed   Hepatitis C Screening  Completed   HPV VACCINES  Aged Out    Health Maintenance  Health Maintenance Due  Topic Date Due   Zoster Vaccines- Shingrix (1 of 2) Never done   COVID-19 Vaccine (4 - Booster for Pfizer series) 04/12/2021    Colorectal cancer screening: No longer required.   Lung Cancer Screening: (Low Dose CT Chest recommended if Age 51-80 years, 30 pack-year currently smoking OR have quit w/in 15years.) does not qualify.   Lung Cancer Screening Referral: N/A  Additional Screening:  Hepatitis C Screening: does qualify; Completed 04/15/17  Vision Screening: Recommended annual ophthalmology exams for early detection of glaucoma and other disorders of the eye. Is the patient up to date with their annual eye exam?  {YES/NO:21197} Who is the provider or what is the name of the office in which the patient attends annual eye exams? *** If pt is not established with a provider, would they like to  be referred to a provider to establish care? {YES/NO:21197}.   Dental Screening: Recommended annual dental exams for proper oral hygiene  Community Resource Referral / Chronic Care Management: CRR required this visit?  {YES/NO:21197}  CCM required this visit?  {YES/NO:21197}     Plan:     I have personally reviewed and noted the following in the patient's chart:   Medical and social history Use of alcohol, tobacco or illicit drugs  Current medications and supplements including opioid prescriptions. {Opioid [phone]} Functional ability and status Nutritional status Physical activity Advanced directives List of other physicians Hospitalizations, surgeries, and ER visits in previous 12 months Vitals Screenings to include cognitive, depression, and falls Referrals and appointments  In addition, I have reviewed and discussed with patient certain preventive protocols, quality metrics, and best practice recommendations. A written personalized care plan for preventive services as well as general preventive health recommendations were provided to patient.     Salomon Mast Adrik Khim, CMA   05/01/2022   Nurse Notes: ***

## 2022-05-02 ENCOUNTER — Ambulatory Visit: Payer: Medicare Other

## 2022-05-02 ENCOUNTER — Other Ambulatory Visit: Payer: Self-pay | Admitting: Internal Medicine

## 2022-05-03 ENCOUNTER — Ambulatory Visit: Payer: Medicare Other

## 2022-05-03 ENCOUNTER — Telehealth: Payer: Self-pay | Admitting: Internal Medicine

## 2022-05-03 MED ORDER — LEVOTHYROXINE SODIUM 100 MCG PO TABS: ORAL_TABLET | ORAL | 0 refills | Status: DC

## 2022-05-03 NOTE — Telephone Encounter (Signed)
Rx sent to pharmacy informing Pt lost bottle- requesting early refill.  ?

## 2022-05-03 NOTE — Telephone Encounter (Signed)
Patient states he lost the bottle of his levothyroxine (SYNTHROID) 100 MCG tablet  medication and has not taken it in a while. He would like a refill for it and a call back to know if not taking the medication for a few days is going to harm him. Please advise.  ?

## 2022-05-14 ENCOUNTER — Other Ambulatory Visit: Payer: Self-pay | Admitting: Internal Medicine

## 2022-05-14 ENCOUNTER — Telehealth: Payer: Self-pay | Admitting: Internal Medicine

## 2022-05-14 DIAGNOSIS — G8929 Other chronic pain: Secondary | ICD-10-CM

## 2022-05-14 NOTE — Telephone Encounter (Signed)
Requesting: Hydrocodone 10-325mg   Contract: 04/12/2021 UDS: 04/12/2021 Last Visit: 10/18/21 Next Visit: 05/16/22 Last Refill: 04/10/22 #60 and 0RF  Please Advise

## 2022-05-14 NOTE — Telephone Encounter (Signed)
Medication: HYDROcodone-acetaminophen (NORCO) 10-325 MG tablet  Has the patient contacted their pharmacy? No.   Preferred Pharmacy:   [id number] - [place], [place] - [address] AT [address], [place] [address] Phone: [phone]  Fax: [phone] 

## 2022-05-15 MED ORDER — HYDROCODONE-ACETAMINOPHEN 10-325 MG PO TABS: 1.0000 | ORAL_TABLET | Freq: Two times a day (BID) | ORAL | 0 refills | Status: DC | PRN

## 2022-05-15 NOTE — Telephone Encounter (Signed)
Pdmp ok, rx sent x 2 

## 2022-05-16 ENCOUNTER — Ambulatory Visit: Payer: Medicare Other | Admitting: Internal Medicine

## 2022-05-16 ENCOUNTER — Ambulatory Visit: Payer: Medicare Other

## 2022-05-23 ENCOUNTER — Encounter: Payer: Self-pay | Admitting: Internal Medicine

## 2022-05-23 ENCOUNTER — Ambulatory Visit: Payer: Medicare Other | Admitting: Internal Medicine

## 2022-05-23 ENCOUNTER — Ambulatory Visit (INDEPENDENT_AMBULATORY_CARE_PROVIDER_SITE_OTHER): Payer: Medicare Other

## 2022-05-23 VITALS — BP 126/82 | HR 97 | Temp 97.7°F | Resp 16 | Ht 70.0 in | Wt 254.0 lb

## 2022-05-23 DIAGNOSIS — I10 Essential (primary) hypertension: Secondary | ICD-10-CM

## 2022-05-23 DIAGNOSIS — I4811 Longstanding persistent atrial fibrillation: Secondary | ICD-10-CM | POA: Diagnosis not present

## 2022-05-23 DIAGNOSIS — Z79899 Other long term (current) drug therapy: Secondary | ICD-10-CM

## 2022-05-23 DIAGNOSIS — E079 Disorder of thyroid, unspecified: Secondary | ICD-10-CM | POA: Diagnosis not present

## 2022-05-23 DIAGNOSIS — G8929 Other chronic pain: Secondary | ICD-10-CM

## 2022-05-23 DIAGNOSIS — M545 Low back pain, unspecified: Secondary | ICD-10-CM

## 2022-05-23 DIAGNOSIS — Z0000 Encounter for general adult medical examination without abnormal findings: Secondary | ICD-10-CM

## 2022-05-23 MED ORDER — METOPROLOL SUCCINATE ER 200 MG PO TB24: ORAL_TABLET | ORAL | 1 refills | Status: DC

## 2022-05-23 MED ORDER — SHINGRIX 50 MCG/0.5ML IM SUSR: 0.5000 mL | Freq: Once | INTRAMUSCULAR | 1 refills | Status: AC

## 2022-05-23 NOTE — Patient Instructions (Signed)
Warren [name] , Thank you for taking time to come for your Medicare Wellness Visit. I appreciate your ongoing commitment to your health goals. Please review the following plan we discussed and let me know if I can assist you in the future.   Screening recommendations/referrals: Colonoscopy: no longer needed Recommended yearly ophthalmology/optometry visit for glaucoma screening and checkup Recommended yearly dental visit for hygiene and checkup  Vaccinations: Influenza vaccine: up to date Pneumococcal vaccine: up to date Tdap vaccine: up to date Shingles vaccine: Due-May obtain vaccine at your local pharmacy.    Covid-19: Due-May obtain vaccine at your local pharmacy.   Advanced directives: no  Conditions/risks identified: see problem list   Next appointment: Follow up in one year for your annual wellness visit.   Preventive Care 50 Years and Older, Male Preventive care refers to lifestyle choices and visits with your health care provider that can promote health and wellness. What does preventive care include? A yearly physical exam. This is also called an annual well check. Dental exams once or twice a year. Routine eye exams. Ask your health care provider how often you should have your eyes checked. Personal lifestyle choices, including: Daily care of your teeth and gums. Regular physical activity. Eating a healthy diet. Avoiding tobacco and drug use. Limiting alcohol use. Practicing safe sex. Taking low doses of aspirin every day. Taking vitamin and mineral supplements as recommended by your health care provider. What happens during an annual well check? The services and screenings done by your health care provider during your annual well check will depend on your age, overall health, lifestyle risk factors, and family history of disease. Counseling  Your health care provider may ask you questions about your: Alcohol use. Tobacco use. Drug use. Emotional well-being. Home  and relationship well-being. Sexual activity. Eating habits. History of falls. Memory and ability to understand (cognition). Work and work Statistician. Screening  You may have the following tests or measurements: Height, weight, and BMI. Blood pressure. Lipid and cholesterol levels. These may be checked every 5 years, or more frequently if you are over 35 years old. Skin check. Lung cancer screening. You may have this screening every year starting at age 67 if you have a 30-pack-year history of smoking and currently smoke or have quit within the past 15 years. Fecal occult blood test (FOBT) of the stool. You may have this test every year starting at age 44. Flexible sigmoidoscopy or colonoscopy. You may have a sigmoidoscopy every 5 years or a colonoscopy every 10 years starting at age 83. Prostate cancer screening. Recommendations will vary depending on your family history and other risks. Hepatitis C blood test. Hepatitis B blood test. Sexually transmitted disease (STD) testing. Diabetes screening. This is done by checking your blood sugar (glucose) after you have not eaten for a while (fasting). You may have this done every 1-3 years. Abdominal aortic aneurysm (AAA) screening. You may need this if you are a current or former smoker. Osteoporosis. You may be screened starting at age 59 if you are at high risk. Talk with your health care provider about your test results, treatment options, and if necessary, the need for more tests. Vaccines  Your health care provider may recommend certain vaccines, such as: Influenza vaccine. This is recommended every year. Tetanus, diphtheria, and acellular pertussis (Tdap, Td) vaccine. You may need a Td booster every 10 years. Zoster vaccine. You may need this after age 1. Pneumococcal 13-valent conjugate (PCV13) vaccine. One dose is recommended after  age 49. Pneumococcal polysaccharide (PPSV23) vaccine. One dose is recommended after age 23. Talk to  your health care provider about which screenings and vaccines you need and how often you need them. This information is not intended to replace advice given to you by your health care provider. Make sure you discuss any questions you have with your health care provider. Document Released: 01/06/2016 Document Revised: 08/29/2016 Document Reviewed: 10/11/2015 Elsevier Interactive Patient Education  2017 [place] Prevention in the Home Falls can cause injuries. They can happen to people of all ages. There are many things you can do to make your home safe and to help prevent falls. What can I do on the outside of my home? Regularly fix the edges of walkways and driveways and fix any cracks. Remove anything that might make you trip as you walk through a door, such as a raised step or threshold. Trim any bushes or trees on the path to your home. Use bright outdoor lighting. Clear any walking paths of anything that might make someone trip, such as rocks or tools. Regularly check to see if handrails are loose or broken. Make sure that both sides of any steps have handrails. Any raised decks and porches should have guardrails on the edges. Have any leaves, snow, or ice cleared regularly. Use sand or salt on walking paths during winter. Clean up any spills in your garage right away. This includes oil or grease spills. What can I do in the bathroom? Use night lights. Install grab bars by the toilet and in the tub and shower. Do not use towel bars as grab bars. Use non-skid mats or decals in the tub or shower. If you need to sit down in the shower, use a plastic, non-slip stool. Keep the floor dry. Clean up any water that spills on the floor as soon as it happens. Remove soap buildup in the tub or shower regularly. Attach bath mats securely with double-sided non-slip rug tape. Do not have throw rugs and other things on the floor that can make you trip. What can I do in the bedroom? Use  night lights. Make sure that you have a light by your bed that is easy to reach. Do not use any sheets or blankets that are too big for your bed. They should not hang down onto the floor. Have a firm chair that has side arms. You can use this for support while you get dressed. Do not have throw rugs and other things on the floor that can make you trip. What can I do in the kitchen? Clean up any spills right away. Avoid walking on wet floors. Keep items that you use a lot in easy-to-reach places. If you need to reach something above you, use a strong step stool that has a grab bar. Keep electrical cords out of the way. Do not use floor polish or wax that makes floors slippery. If you must use wax, use non-skid floor wax. Do not have throw rugs and other things on the floor that can make you trip. What can I do with my stairs? Do not leave any items on the stairs. Make sure that there are handrails on both sides of the stairs and use them. Fix handrails that are broken or loose. Make sure that handrails are as long as the stairways. Check any carpeting to make sure that it is firmly attached to the stairs. Fix any carpet that is loose or worn. Avoid having throw rugs  at the top or bottom of the stairs. If you do have throw rugs, attach them to the floor with carpet tape. Make sure that you have a light switch at the top of the stairs and the bottom of the stairs. If you do not have them, ask someone to add them for you. What else can I do to help prevent falls? Wear shoes that: Do not have high heels. Have rubber bottoms. Are comfortable and fit you well. Are closed at the toe. Do not wear sandals. If you use a stepladder: Make sure that it is fully opened. Do not climb a closed stepladder. Make sure that both sides of the stepladder are locked into place. Ask someone to hold it for you, if possible. Clearly mark and make sure that you can see: Any grab bars or handrails. First and last  steps. Where the edge of each step is. Use tools that help you move around (mobility aids) if they are needed. These include: Canes. Walkers. Scooters. Crutches. Turn on the lights when you go into a dark area. Replace any light bulbs as soon as they burn out. Set up your furniture so you have a clear path. Avoid moving your furniture around. If any of your floors are uneven, fix them. If there are any pets around you, be aware of where they are. Review your medicines with your doctor. Some medicines can make you feel dizzy. This can increase your chance of falling. Ask your doctor what other things that you can do to help prevent falls. This information is not intended to replace advice given to you by your health care provider. Make sure you discuss any questions you have with your health care provider. Document Released: 10/06/2009 Document Revised: 05/17/2016 Document Reviewed: 01/14/2015 Elsevier Interactive Patient Education  2017 [employer].

## 2022-05-23 NOTE — Progress Notes (Unsigned)
Subjective:    Patient ID: Warren [name], male    DOB: [birth year], 76 y.o.   MRN: [id number]  DOS:  05/23/2022 Type of visit - description: Follow-up  Since the last office visit is doing about the same. Has no major concerns except that he is tired, not a new issue. He denies chest pain or difficulty breathing. Admits to being quite inactive. He also has a history of snoring and he still snores on and off.  Weight gain noted, denies any lower extremity edema Denies nausea vomiting.  No blood in the stools.  Next  Wt Readings from Last 3 Encounters:  05/23/22 254 lb (115.2 kg)  10/23/21 244 lb 12.8 oz (111 kg)  10/18/21 244 lb (110.7 kg)     Review of Systems See above   Past Medical History:  Diagnosis Date   GERD (gastroesophageal reflux disease) 01/01/2020   Hypertension    Hypothyroidism    Increased prostate specific antigen (PSA) velocity    Morbid obesity (HCC)    Persistent atrial fibrillation (HCC)    Sleep-disordered breathing    Supraventricular tachycardia (HCC)    a. s/p concealed left lateral pathway ablation 2002 Dr Graciela Husbands    Past Surgical History:  Procedure Laterality Date   ABLATION  2002   concealed left lateral pathway ablation by Dr Graciela Husbands   APPENDECTOMY     CARDIAC CATHETERIZATION  2001   NO CAD   NM MYOVIEW LTD     12-09   TONSILLECTOMY      Current Outpatient Medications  Medication Instructions   co-enzyme Q-10 30 mg, Oral, Daily   diltiazem (CARDIZEM CD) 120 MG 24 hr capsule TAKE 1 CAPSULE BY MOUTH  DAILY   ELIQUIS 5 MG TABS tablet TAKE 1 TABLET BY MOUTH  TWICE DAILY PLEASE KEEP  CARDIOLOGIST APPOINTMENT   hydrochlorothiazide (HYDRODIURIL) 25 mg, Oral, Daily PRN   HYDROcodone-acetaminophen (NORCO) 10-325 MG tablet 1 tablet, Oral, 2 times daily PRN   HYDROcodone-acetaminophen (NORCO) 10-325 MG tablet 1 tablet, Oral, 2 times daily PRN   levothyroxine (SYNTHROID) 100 MCG tablet TAKE 1 TABLET(100 MCG) BY MOUTH DAILY BEFORE BREAKFAST    metoprolol (TOPROL-XL) 200 MG 24 hr tablet TAKE 1 TABLET(200 MG) BY MOUTH DAILY   Multiple Vitamins-Minerals (CENTRUM SILVER PO) 1 each, Oral, Daily,     nitroGLYCERIN (NITROSTAT) 0.4 mg, Sublingual, Every 5 min x3 PRN   omeprazole (PRILOSEC) 40 MG capsule TAKE 1 CAPSULE(40 MG) BY MOUTH DAILY BEFORE BREAKFAST   potassium chloride SA (KLOR-CON M) 20 MEQ tablet TAKE 3 TABLETS(60 MEQ) BY MOUTH DAILY   traZODone (DESYREL) 50 MG tablet TAKE 1 AND 1/2 TABLETS(75 MG) BY MOUTH AT BEDTIME AS NEEDED FOR SLEEP   TURMERIC PO 4 capsules, Oral, 2 times daily,     Zoster Vaccine Adjuvanted (SHINGRIX) injection 0.5 mLs, Intramuscular,  Once       Objective:   Physical Exam BP 126/82   Pulse 97   Temp 97.7 F (36.5 C) (Oral)   Resp 16   Ht 5\' 10"  (1.778 m)   Wt 254 lb (115.2 kg)   SpO2 96%   BMI 36.45 kg/m  General:   Well developed, NAD, BMI noted. HEENT:  Normocephalic . Face symmetric, atraumatic Lungs:  CTA B Normal respiratory effort, no intercostal retractions, no accessory muscle use. Heart: Irregularly irregular Lower extremities: no pretibial edema bilaterally  Skin: Not pale. Not jaundice Neurologic:  alert & oriented X3.  Speech normal, gait appropriate for age and unassisted Psych--  Cognition and judgment appear intact.  Cooperative with normal attention span and concentration.  Behavior appropriate. No anxious or depressed appearing.      Assessment    ASSESSMENT HTN Hypothyroidism GERD  SVT: Cardiac cath 2001: No CAD, ablation 2002 Persistent atrial fibrillation -- on Eliquis started 08-2015, declines DCCV Insomnia -- Xanax was dc 2015 d/t requiring increasing doses, was changed to Ativan: didn't work. Was switch to Ambien: He had drowsiness the following day. On trazodone prn H/o  Fatigue Sleep-disordered suspected OSA: declined to have a sleep study Increased PSA velocity: Consistently declined to see urology Obesity Back pain-- hydrocodone prn, rx by pcp     PLAN:  EPP:IRJJO well controlled, continue Cardizem, metoprolol, potassium, HCTZ as needed.  Check BMP. Atrial fibrillation: Saw cardiology 10/23/2021, felt to be stable.  Anticoagulated, check a CBC. Hypothyroidism: On Synthroid, checking labs. Chronic back pain: See LOV, stable on hydrocodone twice daily. Fatigue: Reports fatigue without chest pain, no edema on exam.  No depression.  I am suspicious of OSA however he has declined before -and also today-  further evaluation. OSA?  See above. Preventive care: Shingrix and COVID vaccination booster recommended. RTC 4 to 5 months   Here for CPX HTN: Seems well controlled, checking labs, continue present care. Hypothyroidism: On Synthroid, check TSH A. fib: No symptoms, seems to be tolerating anticoagulation well.  Checking a CBC. Insomnia: On trazodone, symptoms not completely well controlled. Chronic back pain: On hydrocodone 10 mg twice daily, we discussed decreasing hydrocodone dose, he is very reluctant, "I still hurt even with 10 mg twice daily".  Pain management referral?  Declined Skin lesion, R cheek: See physical exam, refer to Derm RTC 6 months

## 2022-05-23 NOTE — Patient Instructions (Addendum)
Recommend to proceed with the following vaccines at your pharmacy: Shingrix (shingles)- see printed prescription Covid booster (bivalent)   GO TO THE LAB : Get the blood work     GO TO THE FRONT DESK, PLEASE SCHEDULE YOUR APPOINTMENTS Come back for a checkup in 4 to 5 months

## 2022-05-23 NOTE — Progress Notes (Addendum)
Subjective:   Warren [name] is a 76 y.o. male who presents for Medicare Annual/Subsequent preventive examination.  Review of Systems     Cardiac Risk Factors include: advanced age (>71men, >62 women);smoking/ tobacco exposure;hypertension;dyslipidemia     Objective:    Today's Vitals   05/23/22 1421  BP: 126/82  Pulse: 97  Resp: 16  Temp: 97.7 F (36.5 C)  SpO2: 96%  Weight: 254 lb (115.2 kg)  Height: 5\' 10"  (1.778 m)   Body mass index is 36.45 kg/m.     05/23/2022    2:27 PM 11/14/2018    9:22 PM 11/13/2018    7:47 PM 04/15/2017    1:29 PM  Advanced Directives  Does Patient Have a Medical Advance Directive? No No No No  Would patient like information on creating a medical advance directive? No - Patient declined  No - Patient declined No - Patient declined    Current Medications (verified) Outpatient Encounter Medications as of 05/23/2022  Medication Sig   co-enzyme Q-10 30 MG capsule Take 30 mg by mouth daily.   diltiazem (CARDIZEM CD) 120 MG 24 hr capsule TAKE 1 CAPSULE BY MOUTH  DAILY   ELIQUIS 5 MG TABS tablet TAKE 1 TABLET BY MOUTH  TWICE DAILY PLEASE KEEP  CARDIOLOGIST APPOINTMENT   hydrochlorothiazide (HYDRODIURIL) 25 MG tablet Take 1 tablet (25 mg total) by mouth daily as needed.   HYDROcodone-acetaminophen (NORCO) 10-325 MG tablet Take 1 tablet by mouth 2 (two) times daily as needed for moderate pain.   HYDROcodone-acetaminophen (NORCO) 10-325 MG tablet Take 1 tablet by mouth 2 (two) times daily as needed for moderate pain.   levothyroxine (SYNTHROID) 100 MCG tablet TAKE 1 TABLET(100 MCG) BY MOUTH DAILY BEFORE BREAKFAST   Multiple Vitamins-Minerals (CENTRUM SILVER PO) Take 1 each by mouth daily.   nitroGLYCERIN (NITROSTAT) 0.4 MG SL tablet Place 1 tablet (0.4 mg total) under the tongue every 5 (five) minutes x 3 doses as needed for chest pain.   omeprazole (PRILOSEC) 40 MG capsule TAKE 1 CAPSULE(40 MG) BY MOUTH DAILY BEFORE BREAKFAST   potassium chloride  SA (KLOR-CON M) 20 MEQ tablet TAKE 3 TABLETS(60 MEQ) BY MOUTH DAILY   traZODone (DESYREL) 50 MG tablet TAKE 1 AND 1/2 TABLETS(75 MG) BY MOUTH AT BEDTIME AS NEEDED FOR SLEEP   TURMERIC PO Take 4 capsules by mouth 2 (two) times daily.   Zoster Vaccine Adjuvanted [facility]) injection Inject 0.5 mLs into the muscle once for 1 dose.   [DISCONTINUED] metoprolol (TOPROL-XL) 200 MG 24 hr tablet TAKE 1 TABLET(200 MG) BY MOUTH DAILY   No facility-administered encounter medications on file as of 05/23/2022.    Allergies (verified) Patient has no known allergies.   History: Past Medical History:  Diagnosis Date   GERD (gastroesophageal reflux disease) 01/01/2020   Hypertension    Hypothyroidism    Increased prostate specific antigen (PSA) velocity    Morbid obesity (HCC)    Persistent atrial fibrillation (HCC)    Sleep-disordered breathing    Supraventricular tachycardia ([place])    a. s/p concealed left lateral pathway ablation 2002 Dr Caryl Comes   Past Surgical History:  Procedure Laterality Date   ABLATION  2002   concealed left lateral pathway ablation by Dr Caryl Comes   APPENDECTOMY     CARDIAC CATHETERIZATION  2001   NO CAD   NM MYOVIEW LTD     12-09   TONSILLECTOMY     Family History  Adopted: Yes  Family history unknown: Yes   Social  History   Socioeconomic History   Marital status: Single    Spouse name: Not on file   Number of children: 0   Years of education: Not on file   Highest education level: Not on file  Occupational History   Occupation: not working, Musician, 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  Tobacco Use   Smoking status: Every Day    Types: Cigars   Smokeless tobacco: Never   Tobacco comments:    HAS 2 LARGE CIGARS A DAY  Vaping Use   Vaping Use: Never used  Substance and Sexual Activity   Alcohol use: Yes    Alcohol/week: 0.0 standard drinks    Comment: RARE   Drug use: No   Sexual activity: Not on file  Other Topics Concern   Not on file   Social History Narrative   Lives by himself , has no family,  for emergency contact he provided today the number of a  neighbor [phone]  Lesia Hausen)            Social Determinants of Health   Financial Resource Strain: Low Risk    Difficulty of Paying Living Expenses: Not hard at all  Food Insecurity: No Food Insecurity   Worried About Charity fundraiser in the Last Year: Never true   [place] in the Last Year: Never true  Transportation Needs: No Transportation Needs   Lack of Transportation (Medical): No   Lack of Transportation (Non-Medical): No  Physical Activity: Inactive   Days of Exercise per Week: 0 days   Minutes of Exercise per Session: 0 min  Stress: No Stress Concern Present   Feeling of Stress : Not at all  Social Connections: Unknown   Frequency of Communication with Friends and Family: More than three times a week   Frequency of Social Gatherings with Friends and Family: More than three times a week   Attends Religious Services: More than 4 times per year   Active Member of [employer] or Organizations: No   Attends Music therapist: Never   Marital Status: Patient refused    Tobacco Counseling Ready to quit: Not Answered Counseling given: Not Answered Tobacco comments: HAS 2 LARGE CIGARS A DAY   Clinical Intake:  Pre-visit preparation completed: Yes  Pain : No/denies pain     Nutritional Risks: None Diabetes: No  How often do you need to have someone help you when you read instructions, pamphlets, or other written materials from your doctor or pharmacy?: 1 - Never  Diabetic?No  Interpreter Needed?: No  Information entered by :: [place] of Daily Living    05/23/2022    2:32 PM  In your present state of health, do you have any difficulty performing the following activities:  Hearing? 0  Vision? 0  Difficulty concentrating or making decisions? 0  Walking or climbing stairs? 0  Dressing or bathing?  0  Doing errands, shopping? 0  Preparing Food and eating ? N  Using the Toilet? N  In the past six months, have you accidently leaked urine? N  Do you have problems with loss of bowel control? N  Managing your Medications? N  Managing your Finances? N  Housekeeping or managing your Housekeeping? N    Patient Care Team: Colon Branch, MD as PCP - General  Indicate any recent Medical Services you may have received from other than [facility] providers in the past year (  date may be approximate).     Assessment:   This is a routine wellness examination for Warren [name].  Hearing/Vision screen No results found.  Dietary issues and exercise activities discussed: Current Exercise Habits: The patient does not participate in regular exercise at present, Exercise limited by: None identified   Goals Addressed               This Visit's Progress     To wake up each day. (pt-stated)   On track      Depression Screen    05/23/2022    2:30 PM 05/23/2022    2:06 PM 10/18/2021   12:54 PM 04/12/2021    3:16 PM 08/17/2020    1:59 PM 04/28/2018    4:02 PM 04/15/2017    1:29 PM  PHQ 2/9 Scores  PHQ - 2 Score 0 0 0 0 0 0 0    Fall Risk    05/23/2022    2:28 PM 05/23/2022    2:06 PM 10/18/2021   12:54 PM 04/12/2021    3:15 PM 08/17/2020    1:59 PM  [place] in the past year? 0 0 0 0 0  Number falls in past yr: 0 0 0 0 0  Injury with Fall? 0 0 0 0 0  Risk for fall due to : No Fall Risks      Follow up Falls evaluation completed Falls evaluation completed Falls evaluation completed  Falls evaluation completed    [place]:  Any stairs in or around the home? No  If so, are there any without handrails?  N/A Home free of loose throw rugs in walkways, pet beds, electrical cords, etc? Yes  Adequate lighting in your home to reduce risk of falls? Yes   ASSISTIVE DEVICES UTILIZED TO PREVENT FALLS:  Life alert? No  Use of a cane, walker or w/c? No  Grab  bars in the bathroom? No  Shower chair or bench in shower? Yes  Elevated toilet seat or a handicapped toilet? No   TIMED UP AND GO:  Was the test performed? No .    Cognitive Function:        05/23/2022    2:38 PM  6CIT Screen  What Year? 0 points  What month? 0 points  What time? 0 points  Count back from 20 0 points  Months in reverse 0 points  Repeat phrase 10 points  Total Score 10 points    Immunizations Immunization History  Administered Date(s) Administered   Fluad Quad(high Dose 65+) 01/01/2020, 10/18/2021   Influenza Whole 12/24/2005, 10/12/2008, 12/13/2009, 09/13/2010   Influenza, High Dose Seasonal PF 10/31/2015, 10/16/2017, 11/28/2018   Influenza,inj,Quad PF,6+ Mos 10/25/2014   PFIZER(Purple Top)SARS-COV-2 Vaccination 07/18/2020, 08/09/2020, 02/15/2021   Pneumococcal Conjugate-13 04/21/2014   Pneumococcal Polysaccharide-23 12/14/2010, 10/16/2017   Td 10/12/2008, 11/28/2018    TDAP status: Up to date  Flu Vaccine status: Up to date  Pneumococcal vaccine status: Up to date  Covid-19 vaccine status: Information provided on how to obtain vaccines.   Qualifies for Shingles Vaccine? Yes   Zostavax completed No   Shingrix Completed?: No.    Education has been provided regarding the importance of this vaccine. Patient has been advised to call insurance company to determine out of pocket expense if they have not yet received this vaccine. Advised may also receive vaccine at local pharmacy or Health Dept. Verbalized acceptance and understanding.  Screening Tests Health Maintenance  Topic  Date Due   Zoster Vaccines- Shingrix (1 of 2) Never done   COVID-19 Vaccine (4 - Booster for Pfizer series) 04/12/2021   INFLUENZA VACCINE  07/24/2022   TETANUS/TDAP  11/28/2028   Pneumonia Vaccine 41+ Years old  Completed   Hepatitis C Screening  Completed   HPV VACCINES  Aged Out    Health Maintenance  Health Maintenance Due  Topic Date Due   Zoster Vaccines-  Shingrix (1 of 2) Never done   COVID-19 Vaccine (4 - Booster for Pfizer series) 04/12/2021    Colorectal cancer screening: No longer required.   Lung Cancer Screening: (Low Dose CT Chest recommended if Age 87-80 years, 30 pack-year currently smoking OR have quit w/in 15years.) does not qualify.   Lung Cancer Screening Referral: N/A  Additional Screening:  Hepatitis C Screening: does qualify; Completed 04/15/17  Vision Screening: Recommended annual ophthalmology exams for early detection of glaucoma and other disorders of the eye. Is the patient up to date with their annual eye exam?  Yes  Who is the provider or what is the name of the office in which the patient attends annual eye exams? N/A If pt is not established with a provider, would they like to be referred to a provider to establish care? No .   Dental Screening: Recommended annual dental exams for proper oral hygiene  Community Resource Referral / Chronic Care Management: CRR required this visit?  No   CCM required this visit?  No      Plan:     I have personally reviewed and noted the following in the patient's chart:   Medical and social history Use of alcohol, tobacco or illicit drugs  Current medications and supplements including opioid prescriptions. Patient is not currently taking opioid prescriptions. Functional ability and status Nutritional status Physical activity Advanced directives List of other physicians Hospitalizations, surgeries, and ER visits in previous 12 months Vitals Screenings to include cognitive, depression, and falls Referrals and appointments  In addition, I have reviewed and discussed with patient certain preventive protocols, quality metrics, and best practice recommendations. A written personalized care plan for preventive services as well as general preventive health recommendations were provided to patient.     Warren [name], [place]   05/23/2022   Nurse Notes: none  I have  reviewed and agree with Health Coaches documentation.  Kathlene November, MD

## 2022-05-24 LAB — BASIC METABOLIC PANEL
BUN: 19 mg/dL (ref 6–23)
CO2: 28 mEq/L (ref 19–32)
Calcium: 9.1 mg/dL (ref 8.4–10.5)
Chloride: 104 mEq/L (ref 96–112)
Creatinine, Ser: 1.36 mg/dL (ref 0.40–1.50)
GFR: 50.3 mL/min — ABNORMAL LOW (ref >60.00)
Glucose, Bld: 106 mg/dL — ABNORMAL HIGH (ref 70–99)
Potassium: 4 mEq/L (ref 3.5–5.1)
Sodium: 142 mEq/L (ref 135–145)

## 2022-05-24 LAB — CBC WITH DIFFERENTIAL/PLATELET
Basophils Absolute: 0.1 10*3/uL (ref 0.0–0.1)
Basophils Relative: 0.8 % (ref 0.0–3.0)
Eosinophils Absolute: 0.2 10*3/uL (ref 0.0–0.7)
Eosinophils Relative: 1.9 % (ref 0.0–5.0)
HCT: 44 % (ref 39.0–52.0)
Hemoglobin: 14.6 g/dL (ref 13.0–17.0)
Lymphocytes Relative: 18.7 % (ref 12.0–46.0)
Lymphs Abs: 1.6 10*3/uL (ref 0.7–4.0)
MCHC: 33.3 g/dL (ref 30.0–36.0)
MCV: 90.2 fl (ref 78.0–100.0)
Monocytes Absolute: 0.7 10*3/uL (ref 0.1–1.0)
Monocytes Relative: 8.4 % (ref 3.0–12.0)
Neutro Abs: 6 10*3/uL (ref 1.4–7.7)
Neutrophils Relative %: 70.2 % (ref 43.0–77.0)
Platelets: 182 10*3/uL (ref 150.0–400.0)
RBC: 4.88 Mil/uL (ref 4.22–5.81)
RDW: 15 % (ref 11.5–15.5)
WBC: 8.5 10*3/uL (ref 4.0–10.5)

## 2022-05-24 LAB — TSH: TSH: 2.86 u[IU]/mL (ref 0.35–5.50)

## 2022-05-24 NOTE — Assessment & Plan Note (Signed)
XTK:WIOXB well controlled, continue Cardizem, metoprolol, potassium, HCTZ as needed.  Check BMP. Atrial fibrillation: Saw cardiology 10/23/2021, felt to be stable.  Anticoagulated, check a CBC. Hypothyroidism: On Synthroid, checking labs. Chronic back pain: See LOV, stable on hydrocodone twice daily. Fatigue: Reports fatigue without chest pain, no edema on exam.  No depression.  I am suspicious of OSA however he has declined before -and also today-  further evaluation. OSA?  See above. Preventive care: Shingrix and COVID vaccination booster recommended. RTC 4 to 5 months

## 2022-05-25 LAB — DRUG MONITORING PANEL 375977 , URINE
Alcohol Metabolites: NEGATIVE ng/mL (ref <500)
Amphetamine: NEGATIVE ng/mL (ref <250)
Amphetamines: NEGATIVE ng/mL (ref <500)
Barbiturates: NEGATIVE ng/mL (ref <300)
Benzodiazepines: NEGATIVE ng/mL (ref <100)
Cocaine Metabolite: NEGATIVE ng/mL (ref <150)
Codeine: NEGATIVE ng/mL (ref <50)
Desmethyltramadol: NEGATIVE ng/mL (ref <100)
Hydrocodone: 1962 ng/mL — ABNORMAL HIGH (ref <50)
Hydromorphone: 1838 ng/mL — ABNORMAL HIGH (ref <50)
Marijuana Metabolite: NEGATIVE ng/mL (ref <20)
Methamphetamine: NEGATIVE ng/mL (ref <250)
Morphine: NEGATIVE ng/mL (ref <50)
Norhydrocodone: 1439 ng/mL — ABNORMAL HIGH (ref <50)
Opiates: POSITIVE ng/mL — AB (ref <100)
Oxycodone: NEGATIVE ng/mL (ref <100)
Tramadol: NEGATIVE ng/mL (ref <100)

## 2022-05-25 LAB — DM TEMPLATE

## 2022-06-14 ENCOUNTER — Telehealth: Payer: Self-pay

## 2022-06-14 DIAGNOSIS — G8929 Other chronic pain: Secondary | ICD-10-CM

## 2022-06-14 MED ORDER — HYDROCODONE-ACETAMINOPHEN 10-325 MG PO TABS: 1.0000 | ORAL_TABLET | Freq: Two times a day (BID) | ORAL | 0 refills | Status: DC | PRN

## 2022-06-14 NOTE — Telephone Encounter (Signed)
RequestingAwanda Mink Contract: 04/12/2021 UDS:05/23/2022 Last Visit:05/23/2022 Next Visit: N/A Last Refill:05/15/22  Please Advise

## 2022-06-14 NOTE — Telephone Encounter (Signed)
PDMP okay, Rx sent x2 

## 2022-06-17 ENCOUNTER — Other Ambulatory Visit: Payer: Self-pay | Admitting: Internal Medicine

## 2022-07-16 ENCOUNTER — Telehealth: Payer: Self-pay | Admitting: Internal Medicine

## 2022-07-16 NOTE — Telephone Encounter (Signed)
His pharmacy should have prescription on hold for July 2023 for hydrocodone.

## 2022-07-16 NOTE — Telephone Encounter (Signed)
Medication:   HYDROcodone-acetaminophen (NORCO) 10-325 MG tablet [[id number]]   Has the patient contacted their pharmacy? No. (If no, request that the patient contact the pharmacy for the refill.) (If yes, when and what did the pharmacy advise?)  Preferred Pharmacy (with phone number or street name):   [facility] [phone] - [place], [place] - [address] AT [facility] [address]  [address] [place] [address]  Phone:  [phone]  Fax:  [phone]   Agent: Please be advised that RX refills may take up to 3 business days. We ask that you follow-up with your pharmacy.

## 2022-07-20 ENCOUNTER — Other Ambulatory Visit: Payer: Self-pay | Admitting: Internal Medicine

## 2022-08-07 ENCOUNTER — Other Ambulatory Visit: Payer: Self-pay | Admitting: Internal Medicine

## 2022-08-09 ENCOUNTER — Other Ambulatory Visit: Payer: Self-pay | Admitting: Internal Medicine

## 2022-08-13 ENCOUNTER — Other Ambulatory Visit: Payer: Self-pay | Admitting: Internal Medicine

## 2022-08-17 ENCOUNTER — Other Ambulatory Visit: Payer: Self-pay

## 2022-08-17 DIAGNOSIS — G8929 Other chronic pain: Secondary | ICD-10-CM

## 2022-08-17 MED ORDER — HYDROCODONE-ACETAMINOPHEN 10-325 MG PO TABS: 1.0000 | ORAL_TABLET | Freq: Two times a day (BID) | ORAL | 0 refills | Status: DC | PRN

## 2022-08-17 NOTE — Telephone Encounter (Signed)
Requesting: hydrocodone 10-325mg   Contract: 04/12/21 UDS: 05/23/22 Last Visit: 05/23/22 Next Visit: None Last Refill: 06/14/22 #60 and 0RF  Please Advise

## 2022-09-18 ENCOUNTER — Telehealth: Payer: Self-pay | Admitting: Internal Medicine

## 2022-09-18 DIAGNOSIS — G8929 Other chronic pain: Secondary | ICD-10-CM

## 2022-09-18 NOTE — Telephone Encounter (Signed)
Requesting: hydrocodone 10-325mg   Contract: 04/12/21 UDS: 05/23/22 Last Visit: 05/23/22 Next Visit: None Last Refill: 08/17/22 #60 and 0RF  Please Advise

## 2022-09-18 NOTE — Telephone Encounter (Signed)
Medication: HYDROcodone-acetaminophen (NORCO) 10-325 MG tablet [[id number]]    Preferred Pharmacy (with phone number or street name): [facility] [phone] - [place], [place] - [address]  [place], [place] [address]  Phone:  [phone]  Fax:  [phone]   Agent: Please be advised that RX refills may take up to 3 business days. We ask that you follow-up with your pharmacy.

## 2022-09-19 MED ORDER — HYDROCODONE-ACETAMINOPHEN 10-325 MG PO TABS: 1.0000 | ORAL_TABLET | Freq: Two times a day (BID) | ORAL | 0 refills | Status: DC | PRN

## 2022-09-19 NOTE — Telephone Encounter (Signed)
Letter mailed

## 2022-09-19 NOTE — Telephone Encounter (Signed)
PDMP okay, Rx sent. Please send him a letter, due for a visit

## 2022-09-26 ENCOUNTER — Other Ambulatory Visit (HOSPITAL_BASED_OUTPATIENT_CLINIC_OR_DEPARTMENT_OTHER): Payer: Self-pay

## 2022-09-26 MED ORDER — HYDROCODONE-ACETAMINOPHEN 7.5-300 MG PO TABS
1.0000 | ORAL_TABLET | Freq: Two times a day (BID) | ORAL | 0 refills | Status: DC | PRN
  Filled 2022-09-26: qty 60, 30d supply, fill #0

## 2022-09-26 NOTE — Telephone Encounter (Signed)
LMOM informing Pt that Rx has been sent to [facility].

## 2022-09-26 NOTE — Telephone Encounter (Signed)
I agree with your advice 

## 2022-09-26 NOTE — Addendum Note (Signed)
Addended by: Kathlene November E on: 09/26/2022 03:16 PM   Modules accepted: Orders

## 2022-09-26 NOTE — Telephone Encounter (Signed)
Please advise 

## 2022-09-26 NOTE — Telephone Encounter (Signed)
I sent a prescription for hydrocodone 7.5 mg to our pharmacy. Hopefully they have it.

## 2022-09-26 NOTE — Telephone Encounter (Signed)
Patient called to advise that his Hydrocodone -Acetaminophen 10-325 mg is out of stock. He's called about 6 places including some [facility] and no one has it. Patient wants to know if there is another dose, possibly lower, or a different manufacturer that be sent in for him. Please call patient to advise what options there are or what he should do next.

## 2022-09-26 NOTE — Telephone Encounter (Signed)
Pt called back-he states he doesn't have a ride over here to get hydrocodone, he will call around to his pharmacy to see if they have the hydrocodone 7.5mg  and will let us know tomorrow. He then stated that he knows a lady that he can get the 10mg  from- she offered it to him. I informed him that I HIGHLY recommended he not do that, 1 its illegal and 2. It would break his contract with Dr. Larose Kells for this medication and Dr. Larose Kells would have the right to no longer prescribe it to him. Pt states its extortion and always having government related issues.

## 2022-09-27 ENCOUNTER — Other Ambulatory Visit (HOSPITAL_BASED_OUTPATIENT_CLINIC_OR_DEPARTMENT_OTHER): Payer: Self-pay

## 2022-10-01 ENCOUNTER — Other Ambulatory Visit (HOSPITAL_BASED_OUTPATIENT_CLINIC_OR_DEPARTMENT_OTHER): Payer: Self-pay

## 2022-10-02 ENCOUNTER — Ambulatory Visit: Payer: Medicare Other | Admitting: Internal Medicine

## 2022-10-02 ENCOUNTER — Encounter: Payer: Self-pay | Admitting: Internal Medicine

## 2022-10-02 ENCOUNTER — Ambulatory Visit (HOSPITAL_BASED_OUTPATIENT_CLINIC_OR_DEPARTMENT_OTHER)
Admission: RE | Admit: 2022-10-02 | Discharge: 2022-10-02 | Disposition: A | Discharge status: Home / Self Care | Payer: Medicare Other | Source: Ambulatory Visit | Attending: Internal Medicine | Admitting: Internal Medicine

## 2022-10-02 VITALS — BP 122/80 | HR 71 | Temp 98.0°F | Resp 18 | Ht 70.0 in | Wt 233.4 lb

## 2022-10-02 DIAGNOSIS — Z23 Encounter for immunization: Secondary | ICD-10-CM

## 2022-10-02 DIAGNOSIS — E785 Hyperlipidemia, unspecified: Secondary | ICD-10-CM | POA: Diagnosis not present

## 2022-10-02 DIAGNOSIS — R634 Abnormal weight loss: Secondary | ICD-10-CM

## 2022-10-02 DIAGNOSIS — I10 Essential (primary) hypertension: Secondary | ICD-10-CM

## 2022-10-02 DIAGNOSIS — E039 Hypothyroidism, unspecified: Secondary | ICD-10-CM

## 2022-10-02 LAB — CBC WITH DIFFERENTIAL/PLATELET
Basophils Absolute: 0.1 10*3/uL (ref 0.0–0.1)
Basophils Relative: 0.9 % (ref 0.0–3.0)
Eosinophils Absolute: 0.3 10*3/uL (ref 0.0–0.7)
Eosinophils Relative: 4.4 % (ref 0.0–5.0)
HCT: 43.7 % (ref 39.0–52.0)
Hemoglobin: 14.5 g/dL (ref 13.0–17.0)
Lymphocytes Relative: 21.4 % (ref 12.0–46.0)
Lymphs Abs: 1.6 10*3/uL (ref 0.7–4.0)
MCHC: 33.1 g/dL (ref 30.0–36.0)
MCV: 93.1 fl (ref 78.0–100.0)
Monocytes Absolute: 0.6 10*3/uL (ref 0.1–1.0)
Monocytes Relative: 8.6 % (ref 3.0–12.0)
Neutro Abs: 4.7 10*3/uL (ref 1.4–7.7)
Neutrophils Relative %: 64.7 % (ref 43.0–77.0)
Platelets: 192 10*3/uL (ref 150.0–400.0)
RBC: 4.69 Mil/uL (ref 4.22–5.81)
RDW: 15 % (ref 11.5–15.5)
WBC: 7.3 10*3/uL (ref 4.0–10.5)

## 2022-10-02 LAB — COMPREHENSIVE METABOLIC PANEL
ALT: 17 U/L (ref 0–53)
AST: 16 U/L (ref 0–37)
Albumin: 4 g/dL (ref 3.5–5.2)
Alkaline Phosphatase: 53 U/L (ref 39–117)
BUN: 25 mg/dL — ABNORMAL HIGH (ref 6–23)
CO2: 29 mEq/L (ref 19–32)
Calcium: 9 mg/dL (ref 8.4–10.5)
Chloride: 105 mEq/L (ref 96–112)
Creatinine, Ser: 1.38 mg/dL (ref 0.40–1.50)
GFR: 49.3 mL/min — ABNORMAL LOW (ref >60.00)
Glucose, Bld: 99 mg/dL (ref 70–99)
Potassium: 4.3 mEq/L (ref 3.5–5.1)
Sodium: 142 mEq/L (ref 135–145)
Total Bilirubin: 0.6 mg/dL (ref 0.2–1.2)
Total Protein: 6.5 g/dL (ref 6.0–8.3)

## 2022-10-02 LAB — IRON: Iron: 80 ug/dL (ref 42–165)

## 2022-10-02 NOTE — Progress Notes (Signed)
Subjective:    Patient ID: Warren [name], male    DOB: 1946-06-19, 76 y.o.   MRN: [id number]  DOS:  10/02/2022 Type of visit - description: Follow-up  Here for routine follow-up. Weight loss was noted. Reported that is not intentional  He denies fever or chills.  No unusual aches and pains.  No headache No night sweats. No chest pain or difficulty breathing No palpitations No nausea, vomiting, blood in the stools.  No abdominal pain. Occasional diarrhea. He is a smoker, occasional sputum production with cough but no hemoptysis.  He did report that a week ago noted a "maggot or worm" in his stool.  It was about 1 cm long, white in color and "very active". Denies any anal itching      Wt Readings from Last 3 Encounters:  10/02/22 233 lb 6 oz (105.9 kg)  05/23/22 254 lb (115.2 kg)  05/23/22 254 lb (115.2 kg)     Review of Systems See above   Past Medical History:  Diagnosis Date   GERD (gastroesophageal reflux disease) 01/01/2020   Hypertension    Hypothyroidism    Increased prostate specific antigen (PSA) velocity    Morbid obesity (HCC)    Persistent atrial fibrillation (HCC)    Sleep-disordered breathing    Supraventricular tachycardia    a. s/p concealed left lateral pathway ablation 2002 Dr Caryl Comes    Past Surgical History:  Procedure Laterality Date   ABLATION  2002   concealed left lateral pathway ablation by Dr Caryl Comes   APPENDECTOMY     CARDIAC CATHETERIZATION  2001   NO CAD   NM MYOVIEW LTD     12-09   TONSILLECTOMY      Current Outpatient Medications  Medication Instructions   co-enzyme Q-10 30 mg, Oral, Daily   diltiazem (CARDIZEM CD) 120 MG 24 hr capsule TAKE 1 CAPSULE BY MOUTH DAILY   ELIQUIS 5 MG TABS tablet TAKE 1 TABLET BY MOUTH  TWICE DAILY PLEASE KEEP  CARDIOLOGIST APPOINTMENT   hydrochlorothiazide (HYDRODIURIL) 25 MG tablet TAKE 1 TABLET(25 MG) BY MOUTH DAILY AS NEEDED   HYDROcodone-acetaminophen (NORCO) 10-325 MG tablet 1 tablet, Oral,  2 times daily PRN   HYDROcodone-Acetaminophen 7.5-300 MG TABS 1 tablet, Oral, 2 times daily PRN   levothyroxine (SYNTHROID) 100 MCG tablet TAKE 1 TABLET(100 MCG) BY MOUTH DAILY BEFORE BREAKFAST   metoprolol (TOPROL-XL) 200 MG 24 hr tablet TAKE 1 TABLET(200 MG) BY MOUTH DAILY   Multiple Vitamins-Minerals (CENTRUM SILVER PO) 1 each, Oral, Daily,     nitroGLYCERIN (NITROSTAT) 0.4 mg, Sublingual, Every 5 min x3 PRN   omeprazole (PRILOSEC) 40 MG capsule TAKE 1 CAPSULE(40 MG) BY MOUTH DAILY BEFORE BREAKFAST   potassium chloride SA (KLOR-CON M) 20 MEQ tablet TAKE 3 TABLETS BY MOUTH DAILY   traZODone (DESYREL) 50 MG tablet TAKE 1 AND 1/2 TABLETS(75 MG) BY MOUTH AT BEDTIME AS NEEDED FOR SLEEP   TURMERIC PO 4 capsules, Oral, 2 times daily,         Objective:   Physical Exam BP 122/80   Pulse 71   Temp 98 F (36.7 C) (Oral)   Resp 18   Ht 5\' 10"  (1.778 m)   Wt 233 lb 6 oz (105.9 kg)   SpO2 97%   BMI 33.49 kg/m  General: Well developed, NAD, BMI noted Neck: No  thyromegaly  HEENT:  Normocephalic . Face symmetric, atraumatic Lungs:  CTA B Normal respiratory effort, no intercostal retractions, no accessory muscle use. Heart: RRR,  no murmur.  Abdomen:  Not distended, soft, non-tender. No rebound or rigidity.   Lower extremities: no pretibial edema bilaterally  Skin: Exposed areas without rash. Not pale. Not jaundice Neurologic:  alert & oriented X3.  Speech normal, gait appropriate for age and unassisted Strength symmetric and appropriate for age.  Psych: Cognition and judgment appear intact.  Cooperative with normal attention span and concentration.  Behavior appropriate. No anxious or depressed appearing.     Assessment     ASSESSMENT HTN Hypothyroidism GERD  SVT: Cardiac cath 2001: No CAD, ablation 2002 Persistent atrial fibrillation -- on Eliquis started 08-2015, declines DCCV Insomnia -- Xanax was dc 2015 d/t requiring increasing doses, was changed to Ativan: didn't  work. Was switch to Ambien: He had drowsiness the following day. On trazodone prn H/o  Fatigue Sleep-disordered suspected OSA: declined to have a sleep study Increased PSA velocity: Consistently declined to see urology Obesity Back pain-- hydrocodone prn, rx by pcp    PLAN:  Weight loss: Unintended weight loss noted, he feels well, ROS essentially negative except for the fact that he apparently saw a "maggot  or worm" x1 in the stools a week ago. Plan: General labs, ova and parasite in the stools, chest x-ray.  Further advised with results. HTN: Well-controlled, continue Cardizem, HCTZ, metoprolol, potassium.  Labs. Atrial fibs: Anticoagulated without apparent problems Back pain: Having issues getting hydrocodone which is in backorder however he has a prescription ready to pick up today. Thyroid disease: Check TSH. Preventive care: Flu shot today.  Advised about COVID-vaccine  RTC CPX 3 months

## 2022-10-02 NOTE — Patient Instructions (Addendum)
Pick up a container and bring back a stool sample  Consider COVID-vaccine    GO TO THE LAB : Get the blood work     [place], [place] Come back for a physical exam in 3 months

## 2022-10-02 NOTE — Assessment & Plan Note (Addendum)
Weight loss: Unintended weight loss noted, he feels well, ROS essentially negative except for the fact that he apparently saw a "maggot  or worm" x1 in the stools a week ago. Plan: General labs, ova and parasite in the stools, chest x-ray.  Further advised with results. HTN: Well-controlled, continue Cardizem, HCTZ, metoprolol, potassium.  Labs. Atrial fibs: Anticoagulated without apparent problems Back pain: Having issues getting hydrocodone which is in backorder however he has a prescription ready to pick up today. Thyroid disease: Check TSH. Preventive care: Flu shot today.  Advised about COVID-vaccine  RTC CPX 3 months

## 2022-10-03 LAB — TSH: TSH: 2.19 u[IU]/mL (ref 0.35–5.50)

## 2022-10-03 LAB — FERRITIN: Ferritin: 79.6 ng/mL (ref 22.0–322.0)

## 2022-10-18 ENCOUNTER — Other Ambulatory Visit: Payer: Self-pay | Admitting: Internal Medicine

## 2022-10-28 ENCOUNTER — Other Ambulatory Visit: Payer: Self-pay | Admitting: Internal Medicine

## 2022-10-28 DIAGNOSIS — I4811 Longstanding persistent atrial fibrillation: Secondary | ICD-10-CM

## 2022-10-29 NOTE — Telephone Encounter (Signed)
Prescription refill request for Eliquis received. Indication:afib Last office visit:10/22 Scr:1.3 Age: 76 Weight:105.9 kg  Prescription refilled

## 2022-11-15 ENCOUNTER — Other Ambulatory Visit: Payer: Self-pay | Admitting: Internal Medicine

## 2022-12-06 ENCOUNTER — Telehealth: Payer: Self-pay | Admitting: Internal Medicine

## 2022-12-06 DIAGNOSIS — G8929 Other chronic pain: Secondary | ICD-10-CM

## 2022-12-06 MED ORDER — HYDROCODONE-ACETAMINOPHEN 10-325 MG PO TABS: 1.0000 | ORAL_TABLET | Freq: Two times a day (BID) | ORAL | 0 refills | Status: DC | PRN

## 2022-12-06 NOTE — Telephone Encounter (Signed)
Requesting: hydrocodone 10-325mg  (last Rx was 7.5-300 because pharmacy was out of 10-325mg )  Contract:04/12/21 UDS:05/23/22 Last Visit: 10/02/22 Next Visit: None Last Refill: 09/26/22 #60 and 0RF   Please Advise

## 2022-12-06 NOTE — Telephone Encounter (Signed)
PDMP okay, Rx sent 

## 2022-12-06 NOTE — Telephone Encounter (Signed)
Medication: HYDROcodone-acetaminophen (NORCO) 10-325 MG tablet  Has the patient contacted their pharmacy? No.   Preferred Pharmacy:   [id number] - [place], [place] - [address] AT [address], [place] [address] Phone: [phone]  Fax: [phone] 

## 2022-12-30 ENCOUNTER — Other Ambulatory Visit: Payer: Self-pay | Admitting: Internal Medicine

## 2022-12-30 DIAGNOSIS — I4811 Longstanding persistent atrial fibrillation: Secondary | ICD-10-CM

## 2022-12-31 ENCOUNTER — Ambulatory Visit (INDEPENDENT_AMBULATORY_CARE_PROVIDER_SITE_OTHER): Payer: Medicare Other

## 2022-12-31 ENCOUNTER — Ambulatory Visit
Admission: EM | Admit: 2022-12-31 | Discharge: 2022-12-31 | Disposition: A | Discharge status: Home / Self Care | Payer: Medicare Other | Attending: Emergency Medicine | Admitting: Emergency Medicine

## 2022-12-31 DIAGNOSIS — J209 Acute bronchitis, unspecified: Secondary | ICD-10-CM | POA: Diagnosis not present

## 2022-12-31 MED ORDER — TRELEGY ELLIPTA 200-62.5-25 MCG/ACT IN AEPB: 1.0000 | INHALATION_SPRAY | Freq: Every morning | RESPIRATORY_TRACT | 2 refills | Status: AC

## 2022-12-31 NOTE — Telephone Encounter (Signed)
Prescription refill request for Eliquis received. Indication:afib Last office visit:needs appt Scr:1.3 Age: 77 Weight:105.9  kg  Prescription refilled

## 2022-12-31 NOTE — Discharge Instructions (Signed)
At this time, I believe that you are suffering from bronchitis.  Because I just met you, I cannot confirm that it is a chronic bronchitis but because you have smoked cigars for the past 60 years, I believe that is very likely that your bronchitis is chronic.  Therefore, at this time, it is most likely that you are suffering from an exacerbation of chronic bronchitis that has not been bothering you until now.    I recommend that you begin inhaled medication called Trelegy.  Please read the detailed information of below about this medication.  Please inhale 1 puff daily until you are seen by your primary care provider.  For your convenience, I provided you with a sample of this medication in addition to sending a prescription to your pharmacy.  Trelegy (fluticasone, vilanterol and umeclidinium):  This inhaled medication contains a corticosteroid and long-acting form of albuterol.  The inhaled steroid and this medication  is not absorbed into the body and will not cause side effects such as increased blood sugar levels, irritability, sleeplessness or weight gain.  Inhaled corticosteroid are sort of like topical steroid creams but, as you can imagine, it is not practical to attempt to rub a steroid cream inside of your lungs.  The long-acting albuterol works similarly to the short acting albuterol found in your rescue inhaler but provides 24-hour relaxation of the smooth muscles that open and constrict your airways; your short acting rescue inhaler can only provide for a few hours this benefit for a few hours.  The third unique ingredient, umeclidinium, is an antimuscarinic and works similarly to your albuterol, providing long-acting relaxation of the smooth muscles in your airway.  Please feel free to continue using your short acting rescue inhaler as often as needed throughout the day for shortness of breath, wheezing, and cough.  Thank you for visiting urgent care today.

## 2022-12-31 NOTE — ED Provider Notes (Signed)
[facility]    CSN: [id number] Arrival date & time: 12/31/22  1251    HISTORY   Chief Complaint  Patient presents with   Cough    X1 week Cough, chest congestion, and sob   HPI Warren [name] is a pleasant, 77 y.o. male who presents to urgent care today. Patient complains of nonproductive cough, congestion in his chest and shortness of breath for the past week.  On arrival, patient had oxygen saturation of 92% with a blood pressure of 149/114.  Patient reports a 60-year history of smoking cigars every day, states he quit 1 day ago and that the tightness in his chest improved.  Per my observation, patient is audibly wheezing during her visit today.  Patient has also complained multiple times, while waiting to be seen, that his exam room is very hot and he is finding it hard to breathe.  Patient denies history of COPD, emphysema, asthma, bronchitis, allergies.  Patient is currently taking Eliquis, diltiazem and metoprolol for history of atrial fibrillation.    The history is provided by the patient.   Past Medical History:  Diagnosis Date   GERD (gastroesophageal reflux disease) 01/01/2020   Hypertension    Hypothyroidism    Increased prostate specific antigen (PSA) velocity    Morbid obesity (HCC)    Persistent atrial fibrillation (HCC)    Sleep-disordered breathing    Supraventricular tachycardia    a. s/p concealed left lateral pathway ablation 2002 Dr Graciela Husbands   Patient Active Problem List   Diagnosis Date Noted   GERD (gastroesophageal reflux disease) 01/01/2020   PCP NOTES >>>>> 10/31/2015   Annual physical exam 01/29/2012   OBESITY 01/19/2010   TOBACCO ABUSE 01/19/2010   Dyslipidemia 04/12/2009   Disorder of thyroid 03/23/2009   ATRIAL FIBRILLATION 03/23/2009   Chronic back pain 01/11/2009   Insomnia 02/03/2008   PSA, INCREASED 06/12/2007   HTN (hypertension) 05/23/2007   Past Surgical History:  Procedure Laterality Date   ABLATION  2002   concealed left  lateral pathway ablation by Dr Graciela Husbands   APPENDECTOMY     CARDIAC CATHETERIZATION  2001   NO CAD   NM MYOVIEW LTD     12-09   TONSILLECTOMY      Home Medications    Prior to Admission medications   Medication Sig Start Date End Date Taking? Authorizing Provider  apixaban (ELIQUIS) 5 MG TABS tablet Take 1 tablet (5 mg total) by mouth 2 (two) times daily. Needs cardiology appt for refills, please call office 12/31/22  Yes Duke Salvia, MD  co-enzyme Q-10 30 MG capsule Take 30 mg by mouth daily.   Yes [provider]  diltiazem (CARDIZEM CD) 120 MG 24 hr capsule TAKE 1 CAPSULE BY MOUTH DAILY 08/08/22  Yes Paz, Nolon Rod, MD  hydrochlorothiazide (HYDRODIURIL) 25 MG tablet TAKE 1 TABLET(25 MG) BY MOUTH DAILY AS NEEDED 08/13/22  Yes Worthy Rancher B, FNP  HYDROcodone-acetaminophen (NORCO) 10-325 MG tablet Take 1 tablet by mouth 2 (two) times daily as needed for moderate pain. 12/06/22  Yes Paz, Nolon Rod, MD  HYDROcodone-Acetaminophen 7.5-300 MG TABS Take 1 tablet by mouth 2 (two) times daily as needed. 09/26/22  Yes Wanda Plump, MD  levothyroxine (SYNTHROID) 100 MCG tablet Take 1 tablet (100 mcg total) by mouth daily before breakfast. 10/18/22  Yes Wanda Plump, MD  metoprolol (TOPROL-XL) 200 MG 24 hr tablet Take 1 tablet (200 mg total) by mouth daily. Take with or immediately following a meal  10/29/22  Yes Paz, Alda Berthold, MD  Multiple Vitamins-Minerals (CENTRUM SILVER PO) Take 1 each by mouth daily.   Yes [provider]  nitroGLYCERIN (NITROSTAT) 0.4 MG SL tablet Place 1 tablet (0.4 mg total) under the tongue every 5 (five) minutes x 3 doses as needed for chest pain. 08/08/21  Yes Paz, Alda Berthold, MD  omeprazole (PRILOSEC) 40 MG capsule TAKE 1 CAPSULE BY MOUTH DAILY  BEFORE BREAKFAST 11/17/22  Yes Paz, Alda Berthold, MD  potassium chloride SA (KLOR-CON M) 20 MEQ tablet TAKE 3 TABLETS BY MOUTH DAILY 06/18/22  Yes Colon Branch, MD  traZODone (DESYREL) 50 MG tablet TAKE 1 AND 1/2 TABLETS(75 MG) BY MOUTH AT  BEDTIME AS NEEDED FOR SLEEP 07/20/22  Yes Paz, Alda Berthold, MD  TURMERIC PO Take 4 capsules by mouth 2 (two) times daily.   Yes [provider]    Family History Family History  Adopted: Yes  Family history unknown: Yes   Social History Social History   Tobacco Use   Smoking status: Every Day    Types: Cigars   Smokeless tobacco: Never   Tobacco comments:    HAS 2 LARGE CIGARS A DAY  Vaping Use   Vaping Use: Never used  Substance Use Topics   Alcohol use: Yes    Alcohol/week: 0.0 standard drinks of alcohol    Comment: RARE   Drug use: No   Allergies   Patient has no known allergies.  Review of Systems Review of Systems Pertinent findings revealed after performing a 14 point review of systems has been noted in the history of present illness.  Physical Exam Triage Vital Signs ED Triage Vitals  Enc Vitals Group     BP 10/20/21 0827 (!) 147/82     Pulse Rate 10/20/21 0827 72     Resp 10/20/21 0827 18     Temp 10/20/21 0827 98.3 F (36.8 C)     Temp Source 10/20/21 0827 Oral     SpO2 10/20/21 0827 98 %     Weight --      Height --      Head Circumference --      Peak Flow --      Pain Score 10/20/21 0826 5     Pain Loc --      Pain Edu? --      Excl. in [place]? --   No data found.  Updated Vital Signs BP (!) 149/114 (BP Location: Right Arm)   Pulse 89   Temp 98 F (36.7 C) (Oral)   Ht 5\' 11"  (1.803 m)   Wt 237 lb (107.5 kg)   SpO2 93%   BMI 33.05 kg/m   Physical Exam Vitals and nursing note reviewed.  Constitutional:      General: He is not in acute distress.    Appearance: Normal appearance. He is not ill-appearing.  HENT:     Head: Normocephalic and atraumatic.     Salivary Glands: Right salivary gland is not diffusely enlarged or tender. Left salivary gland is not diffusely enlarged or tender.     Right Ear: Hearing, tympanic membrane, ear canal and external ear normal. No drainage. No middle ear effusion. There is no impacted cerumen. Tympanic  membrane is not erythematous or bulging.     Left Ear: Hearing, tympanic membrane, ear canal and external ear normal. No drainage.  No middle ear effusion. There is no impacted cerumen. Tympanic membrane is not erythematous or bulging.     Nose:  Nose normal. No nasal deformity, septal deviation, mucosal edema, congestion or rhinorrhea.     Right Turbinates: Not enlarged, swollen or pale.     Left Turbinates: Not enlarged, swollen or pale.     Right Sinus: No maxillary sinus tenderness or frontal sinus tenderness.     Left Sinus: No maxillary sinus tenderness or frontal sinus tenderness.     Mouth/Throat:     Lips: Pink. No lesions.     Mouth: Mucous membranes are moist. No oral lesions.     Dentition: Abnormal dentition. Dental caries present.     Pharynx: Oropharynx is clear. Uvula midline. No posterior oropharyngeal erythema or uvula swelling.     Tonsils: No tonsillar exudate. 0 on the right. 0 on the left.  Eyes:     General: Lids are normal.        Right eye: No discharge.        Left eye: No discharge.     Extraocular Movements: Extraocular movements intact.     Conjunctiva/sclera: Conjunctivae normal.     Right eye: Right conjunctiva is not injected.     Left eye: Left conjunctiva is not injected.  Neck:     Trachea: Trachea and phonation normal.  Cardiovascular:     Rate and Rhythm: Normal rate and regular rhythm.     Pulses: Normal pulses.     Heart sounds: Normal heart sounds. No murmur heard.    No friction rub. No gallop.  Pulmonary:     Effort: Pulmonary effort is normal. Prolonged expiration present. No tachypnea, bradypnea, accessory muscle usage, respiratory distress or retractions.     Breath sounds: Normal air entry. No stridor, decreased air movement or transmitted upper airway sounds. Examination of the right-upper field reveals rhonchi. Examination of the left-upper field reveals rhonchi. Examination of the right-middle field reveals rhonchi. Examination of the  left-middle field reveals rhonchi. Rhonchi present. No decreased breath sounds, wheezing or rales.  Chest:     Chest wall: No tenderness.  Musculoskeletal:        General: Normal range of motion.     Cervical back: Normal range of motion and neck supple. Normal range of motion.  Lymphadenopathy:     Cervical: No cervical adenopathy.  Skin:    General: Skin is warm and dry.     Findings: No erythema or rash.  Neurological:     General: No focal deficit present.     Mental Status: He is alert and oriented to person, place, and time.  Psychiatric:        Mood and Affect: Mood normal.        Behavior: Behavior normal.     Visual Acuity Right Eye Distance:   Left Eye Distance:   Bilateral Distance:    Right Eye Near:   Left Eye Near:    Bilateral Near:     UC Couse / Diagnostics / Procedures:     Radiology DG Chest 2 View  Result Date: 12/31/2022 CLINICAL DATA:  Cough and congestion with shortness of breath 1 week. Active smoker. EXAM: CHEST - 2 VIEW COMPARISON:  10/02/2022 FINDINGS: Lungs are adequately inflated without lobar consolidation or effusion. Cardiomediastinal silhouette is within normal. Stable contour abnormality in the left retrocardiac region which may be due to tortuosity of the descending aorta versus left atrial enlargement. Remainder the exam is unchanged. IMPRESSION: 1. No acute cardiopulmonary disease. 2. Stable contour abnormality in the left retrocardiac region which may be due to tortuosity of the descending  aorta versus left atrial enlargement. Electronically Signed   By: Elberta Fortis M.D.   On: 12/31/2022 15:33    Procedures Procedures (including critical care time) EKG  Pending results:  Labs Reviewed - No data to display  Medications Ordered in UC: Medications - No data to display  UC Diagnoses / Final Clinical Impressions(s)   I have reviewed the triage vital signs and the nursing notes.  Pertinent labs & imaging results that were available  during my care of the patient were reviewed by me and considered in my medical decision making (see chart for details).    Final diagnoses:  Acute bronchitis, unspecified organism   Physical exam findings concerning for chronic bronchitis.  Patient advised that I recommend that he begin Trelegy, prescription sent to his pharmacy.  Patient advised to follow-up with PCP. Please see discharge instructions below for further details of plan of care as provided to patient. ED Prescriptions     Medication Sig Dispense Auth. Provider   Fluticasone-Umeclidin-Vilant (TRELEGY ELLIPTA) 200-62.5-25 MCG/ACT AEPB Inhale 1 puff into the lungs in the morning. 30 each Theadora Rama Scales, PA-C      PDMP not reviewed this encounter.  Disposition Upon Discharge:  Condition: stable for discharge home Home: take medications as prescribed; routine discharge instructions as discussed; follow up as advised.  Patient presented with an acute illness with associated systemic symptoms and significant discomfort requiring urgent management. In my opinion, this is a condition that a prudent lay person (someone who possesses an average knowledge of health and medicine) may potentially expect to result in complications if not addressed urgently such as respiratory distress, impairment of bodily function or dysfunction of bodily organs.   Routine symptom specific, illness specific and/or disease specific instructions were discussed with the patient and/or caregiver at length.   As such, the patient has been evaluated and assessed, work-up was performed and treatment was provided in alignment with urgent care protocols and evidence based medicine.  Patient/parent/caregiver has been advised that the patient may require follow up for further testing and treatment if the symptoms continue in spite of treatment, as clinically indicated and appropriate.  If the patient was tested for COVID-19, Influenza and/or RSV, then the  patient/parent/guardian was advised to isolate at home pending the results of his/her diagnostic coronavirus test and potentially longer if they're positive. I have also advised pt that if his/her COVID-19 test returns positive, it's recommended to self-isolate for at least 10 days after symptoms first appeared AND until fever-free for 24 hours without fever reducer AND other symptoms have improved or resolved. Discussed self-isolation recommendations as well as instructions for household member/close contacts as per the [facility] and [place] DHHS, and also gave patient the COVID packet with this information.  Patient/parent/caregiver has been advised to return to the [facility] or PCP in 3-5 days if no better; to PCP or the Emergency Department if new signs and symptoms develop, or if the current signs or symptoms continue to change or worsen for further workup, evaluation and treatment as clinically indicated and appropriate  The patient will follow up with their current PCP if and as advised. If the patient does not currently have a PCP we will assist them in obtaining one.   The patient may need specialty follow up if the symptoms continue, in spite of conservative treatment and management, for further workup, evaluation, consultation and treatment as clinically indicated and appropriate.  Patient/parent/caregiver verbalized understanding and agreement of plan as discussed.  All questions  were addressed during visit.  Please see discharge instructions below for further details of plan.  Discharge Instructions:   Discharge Instructions      At this time, I believe that you are suffering from bronchitis.  Because I just met you, I cannot confirm that it is a chronic bronchitis but because you have smoked cigars for the past 60 years, I believe that is very likely that your bronchitis is chronic.  Therefore, at this time, it is most likely that you are suffering from an exacerbation of chronic bronchitis that has not  been bothering you until now.    I recommend that you begin inhaled medication called Trelegy.  Please read the detailed information of below about this medication.  Please inhale 1 puff daily until you are seen by your primary care provider.  For your convenience, I provided you with a sample of this medication in addition to sending a prescription to your pharmacy.  Trelegy (fluticasone, vilanterol and umeclidinium):  This inhaled medication contains a corticosteroid and long-acting form of albuterol.  The inhaled steroid and this medication  is not absorbed into the body and will not cause side effects such as increased blood sugar levels, irritability, sleeplessness or weight gain.  Inhaled corticosteroid are sort of like topical steroid creams but, as you can imagine, it is not practical to attempt to rub a steroid cream inside of your lungs.  The long-acting albuterol works similarly to the short acting albuterol found in your rescue inhaler but provides 24-hour relaxation of the smooth muscles that open and constrict your airways; your short acting rescue inhaler can only provide for a few hours this benefit for a few hours.  The third unique ingredient, umeclidinium, is an antimuscarinic and works similarly to your albuterol, providing long-acting relaxation of the smooth muscles in your airway.  Please feel free to continue using your short acting rescue inhaler as often as needed throughout the day for shortness of breath, wheezing, and cough.  Thank you for visiting urgent care today.      This office note has been dictated using Teaching laboratory technician.  Unfortunately, this method of dictation can sometimes lead to typographical or grammatical errors.  I apologize for your inconvenience in advance if this occurs.  Please do not hesitate to reach out to me if clarification is needed.      Theadora Rama Scales, [place] 12/31/22 [phone]

## 2022-12-31 NOTE — ED Triage Notes (Signed)
Pt states that he has a cough, chest congestion, and sob. X1 week

## 2022-12-31 NOTE — ED Notes (Signed)
Returned from pulse oximetry while ambulating test. Patients o2 sat remained at 93% RA, with no SOB noted.

## 2023-01-02 ENCOUNTER — Telehealth: Payer: Self-pay | Admitting: Internal Medicine

## 2023-01-02 MED ORDER — ALBUTEROL SULFATE HFA 108 (90 BASE) MCG/ACT IN AERS: 2.0000 | INHALATION_SPRAY | Freq: Four times a day (QID) | RESPIRATORY_TRACT | 0 refills | Status: DC | PRN

## 2023-01-02 NOTE — Telephone Encounter (Signed)
LMOM informing of PCP recommendations. Albuterol sent to mail order as requested by Pt.

## 2023-01-02 NOTE — Telephone Encounter (Signed)
Patient called to advise he was at urgent care on Monday and his blood pressure was 126/45 which is quite high for him. Patient said he was prescribed an inhaler. He said it helps for a little bit but then it wears off and the coughing comes back. Patient said it makes his insomnia worse and his appetite is gone. Patient has trouble getting transportation to [place] and wanted me to send message back to see if Dr. Larose Kells can review the notes and let him know what he should do. Patient mentioned also wanting refill of inhaler to be sent to mail order pharmacy for 3 months so he can get them delivered at a cheaper cost. Please call patient to advise.

## 2023-01-02 NOTE — Telephone Encounter (Signed)
-   Recommend to check BPs at home, goal less than 135/85. - He has been prescribed Trelegy, very expensive inhaler, if he still has  cough and wheezing, recommend to use albuterol 2 puffs every 6 hours as needed.  Send Rx x1, no RF. (it  is less expensive, okay to send it to local pharmacy) - Other symptoms: Needs a visit.   BP Readings from Last 3 Encounters:  12/31/22 (!) 146/94  10/02/22 122/80  05/23/22 126/82

## 2023-01-11 ENCOUNTER — Other Ambulatory Visit: Payer: Self-pay | Admitting: Internal Medicine

## 2023-01-14 ENCOUNTER — Telehealth: Payer: Self-pay | Admitting: Internal Medicine

## 2023-01-14 DIAGNOSIS — G8929 Other chronic pain: Secondary | ICD-10-CM

## 2023-01-14 NOTE — Telephone Encounter (Signed)
Medication: HYDROcodone-acetaminophen (NORCO) 10-325 MG tablet  Has the patient contacted their pharmacy? No.   Preferred Pharmacy:   [facility] [place], [place] - [address] [place], [place] [address] Phone: [phone]  Fax: [phone]

## 2023-01-14 NOTE — Telephone Encounter (Signed)
Hydrocodone 10-325mg  Contract:04/12/21 UDS:05/23/22 Last Visit: 10/02/22 Next Visit: None Last Refill: 12/06/22 #60 and 0RF  Please advise.

## 2023-01-15 MED ORDER — HYDROCODONE-ACETAMINOPHEN 10-325 MG PO TABS: 1.0000 | ORAL_TABLET | Freq: Two times a day (BID) | ORAL | 0 refills | Status: DC | PRN

## 2023-01-15 NOTE — Telephone Encounter (Signed)
PDMP okay, Rx sent 

## 2023-02-01 ENCOUNTER — Other Ambulatory Visit: Payer: Self-pay | Admitting: Internal Medicine

## 2023-02-12 ENCOUNTER — Other Ambulatory Visit: Payer: Self-pay | Admitting: Internal Medicine

## 2023-02-13 ENCOUNTER — Telehealth: Payer: Self-pay | Admitting: Internal Medicine

## 2023-02-13 NOTE — Telephone Encounter (Signed)
Hydrocodone prescription for February already on file at pharmacy.

## 2023-02-13 NOTE — Telephone Encounter (Signed)
Prescription Request  02/13/2023  Is this a "Controlled Substance" medicine? Yes  LOV: 10/02/2022  What is the name of the medication or equipment? HYDROcodone-acetaminophen (NORCO) 10-325 MG tablet   Have you contacted your pharmacy to request a refill? No   Which pharmacy would you like this sent to?  [facility] [phone] - [place], [place] - [address] AT [address] [place] [address] Phone: [phone] Fax: [phone]      Patient notified that their request is being sent to the clinical staff for review and that they should receive a response within 2 business days.   Please advise at [facility] [phone]

## 2023-02-15 ENCOUNTER — Telehealth: Payer: Self-pay | Admitting: Internal Medicine

## 2023-02-15 NOTE — Telephone Encounter (Signed)
Error

## 2023-02-28 ENCOUNTER — Other Ambulatory Visit: Payer: Self-pay | Admitting: Internal Medicine

## 2023-03-03 ENCOUNTER — Other Ambulatory Visit: Payer: Self-pay | Admitting: Internal Medicine

## 2023-03-03 DIAGNOSIS — I4811 Longstanding persistent atrial fibrillation: Secondary | ICD-10-CM

## 2023-03-04 NOTE — Telephone Encounter (Signed)
Prescription refill request for Eliquis received. Indication: Afib  Last office visit: 10/23/21 Caryl Comes)  Scr: 1.38 (10/02/22)  Age: 77 Weight: 107.5kg  Pt overdue to see provider. Called pt, unable to leave voicemail. Message sent to schedulers.

## 2023-03-08 ENCOUNTER — Encounter: Payer: Self-pay | Admitting: Internal Medicine

## 2023-03-08 NOTE — Telephone Encounter (Signed)
Error

## 2023-03-11 ENCOUNTER — Telehealth: Payer: Self-pay | Admitting: Internal Medicine

## 2023-03-11 ENCOUNTER — Encounter: Payer: Self-pay | Admitting: Internal Medicine

## 2023-03-11 NOTE — Telephone Encounter (Signed)
Warren Romberg, RN  P [facility]v [facility] Scheduling Good Morning,  Pt is overdue to see Dr Caryl Comes. Can you please reach out to pt and schedule next available appt?  Thank you, Abigail, RN   LVM x3 to schedule overdue f/u, will send reminder letter.

## 2023-03-13 ENCOUNTER — Other Ambulatory Visit: Payer: Self-pay | Admitting: Internal Medicine

## 2023-03-14 ENCOUNTER — Other Ambulatory Visit: Payer: Self-pay | Admitting: Internal Medicine

## 2023-03-15 ENCOUNTER — Telehealth: Payer: Self-pay | Admitting: Internal Medicine

## 2023-03-15 DIAGNOSIS — G8929 Other chronic pain: Secondary | ICD-10-CM

## 2023-03-15 NOTE — Telephone Encounter (Signed)
Prescription Request  03/15/2023  Is this a "Controlled Substance" medicine? Yes  LOV: 10/02/2022  What is the name of the medication or equipment?   HYDROcodone-acetaminophen (NORCO) 10-325 MG tablet [id number]   Have you contacted your pharmacy to request a refill? No   Which pharmacy would you like this sent to?   [facility] [phone] - [place], [place] - [address] [place] [address] Phone: [phone] Fax: [phone]  Patient notified that their request is being sent to the clinical staff for review and that they should receive a response within 2 business days.   Please advise at Mobile [phone] (mobile)

## 2023-03-18 MED ORDER — HYDROCODONE-ACETAMINOPHEN 10-325 MG PO TABS: 1.0000 | ORAL_TABLET | Freq: Two times a day (BID) | ORAL | 0 refills | Status: DC | PRN

## 2023-03-18 NOTE — Addendum Note (Signed)
Addended byDamita Dunnings D on: 03/18/2023 07:40 AM   Modules accepted: Orders

## 2023-03-18 NOTE — Telephone Encounter (Signed)
Requesting: hydrocodone 10-325mg   Contract: 04/12/21 UDS: 05/23/22 Last Visit: 10/02/22 Next Visit: 05/17/23 Last Refill: 01/15/23 #60 and 0RF (x2)  Please Advise

## 2023-03-18 NOTE — Addendum Note (Signed)
Addended by: Colon Branch on: 03/18/2023 09:24 AM   Modules accepted: Orders

## 2023-03-18 NOTE — Telephone Encounter (Signed)
PDMP okay, prescription sent 

## 2023-04-13 ENCOUNTER — Other Ambulatory Visit: Payer: Self-pay | Admitting: Internal Medicine

## 2023-04-16 ENCOUNTER — Telehealth: Payer: Self-pay | Admitting: Internal Medicine

## 2023-04-16 NOTE — Telephone Encounter (Signed)
Already on file for April 2024.

## 2023-04-16 NOTE — Telephone Encounter (Signed)
Prescription Request  04/16/2023  Is this a "Controlled Substance" medicine? Yes  LOV: Visit date not found  What is the name of the medication or equipment?  HYDROcodone-acetaminophen (NORCO) 10-325 MG tablet   Have you contacted your pharmacy to request a refill? No   Which pharmacy would you like this sent to?  [facility] [phone] - [place], [place] - [address] AT [facility] [address]  [place] [address] Phone: [phone] Fax: [phone]   Patient notified that their request is being sent to the clinical staff for review and that they should receive a response within 2 business days.   Please advise at Mobile [phone] (mobile)

## 2023-04-28 NOTE — Progress Notes (Unsigned)
Cardiology Office Note Date:  04/28/2023  Patient ID:  Warren [name], Warren [name] January 20, 1946, MRN [id number] PCP:  Wanda Plump, MD  Electrophysiologist: Dr. Graciela Husbands  ***refresh   Chief Complaint: *** ???  History of Present Illness: Warren [name] is a 77 y.o. male with history of HTN, AFib, hypothyroidism   Saw Dr. Graciela Husbands 10/312/22, difficulty with back pain, poor sleep, some DOE.  Afib described as permanent, planned to f/u with Dr. Drue Novel and EP as needed  *** eliquis, bleeding, labs, dose *** rate *** symptoms  AAD hx Flecainide remotely, stopped 2/2 persistent arrhythmia (pt declined DCCV/rhythm control)   Past Medical History:  Diagnosis Date   GERD (gastroesophageal reflux disease) 01/01/2020   Hypertension    Hypothyroidism    Increased prostate specific antigen (PSA) velocity    Morbid obesity (HCC)    Persistent atrial fibrillation (HCC)    Sleep-disordered breathing    Supraventricular tachycardia    a. s/p concealed left lateral pathway ablation 2002 Dr Graciela Husbands    Past Surgical History:  Procedure Laterality Date   ABLATION  2002   concealed left lateral pathway ablation by Dr Graciela Husbands   APPENDECTOMY     CARDIAC CATHETERIZATION  2001   NO CAD   NM MYOVIEW LTD     12-09   TONSILLECTOMY      Current Outpatient Medications  Medication Sig Dispense Refill   albuterol (VENTOLIN HFA) 108 (90 Base) MCG/ACT inhaler USE 2 INHALATIONS BY MOUTH EVERY 6 HOURS AS NEEDED FOR WHEEZING  OR SHORTNESS OF BREATH 54 g 1   apixaban (ELIQUIS) 5 MG TABS tablet Take 1 tablet (5 mg total) by mouth 2 (two) times daily. Needs cardiology appt for refills, please call office 180 tablet 0   co-enzyme Q-10 30 MG capsule Take 30 mg by mouth daily.     diltiazem (CARDIZEM CD) 120 MG 24 hr capsule Take 1 capsule (120 mg total) by mouth daily. 90 capsule 0   hydrochlorothiazide (HYDRODIURIL) 25 MG tablet TAKE 1 TABLET(25 MG) BY MOUTH DAILY AS NEEDED 90 tablet 0   HYDROcodone-acetaminophen (NORCO)  10-325 MG tablet Take 1 tablet by mouth 2 (two) times daily as needed for moderate pain. 60 tablet 0   HYDROcodone-acetaminophen (NORCO) 10-325 MG tablet Take 1 tablet by mouth 2 (two) times daily as needed for moderate pain. 60 tablet 0   levothyroxine (SYNTHROID) 100 MCG tablet TAKE 1 TABLET(100 MCG) BY MOUTH DAILY BEFORE BREAKFAST 90 tablet 1   metoprolol (TOPROL-XL) 200 MG 24 hr tablet TAKE 1 TABLET(200 MG) BY MOUTH DAILY 90 tablet 1   Multiple Vitamins-Minerals (CENTRUM SILVER PO) Take 1 each by mouth daily.     nitroGLYCERIN (NITROSTAT) 0.4 MG SL tablet Place 1 tablet (0.4 mg total) under the tongue every 5 (five) minutes x 3 doses as needed for chest pain. 25 tablet 3   omeprazole (PRILOSEC) 40 MG capsule TAKE 1 CAPSULE BY MOUTH DAILY  BEFORE BREAKFAST 90 capsule 3   potassium chloride SA (KLOR-CON M) 20 MEQ tablet TAKE 3 TABLETS BY MOUTH ONCE DAILY 270 tablet 0   traZODone (DESYREL) 50 MG tablet TAKE 1 AND 1/2 TABLETS(75 MG) BY MOUTH AT BEDTIME AS NEEDED FOR SLEEP 45 tablet 0   TURMERIC PO Take 4 capsules by mouth 2 (two) times daily.     No current facility-administered medications for this visit.    Allergies:   Patient has no known allergies.   Social History:  The patient  reports that he  has been smoking cigars. He has never used smokeless tobacco. He reports current alcohol use. He reports that he does not use drugs.   Family History:  The patient's He was adopted. Family history is unknown by patient.  ROS:  Please see the history of present illness.    All other systems are reviewed and otherwise negative.   PHYSICAL EXAM:  VS:  There were no vitals taken for this visit. BMI: There is no height or weight on file to calculate BMI. Well nourished, well developed, in no acute distress HEENT: normocephalic, atraumatic Neck: no JVD, carotid bruits or masses Cardiac:  *** RRR; no significant murmurs, no rubs, or gallops Lungs:  *** CTA b/l, no wheezing, rhonchi or rales Abd:  soft, nontender MS: no deformity or *** atrophy Ext: *** no edema Skin: warm and dry, no rash Neuro:  No gross deficits appreciated Psych: euthymic mood, full affect   EKG:  Done today and reviewed by myself shows  ***   12/12/2015: TTE Left ventricle: The cavity size was normal. Systolic function was    normal. The estimated ejection fraction was in the range of 55%    to 60%. Wall motion was normal; there were no regional wall    motion abnormalities.  - Mitral valve: Calcified annulus.  - Left atrium: The atrium was severely dilated.  - Right ventricle: The cavity size was normal. Wall thickness was    normal.  - Right atrium: The atrium was mildly dilated.  - Tricuspid valve: There was trivial regurgitation.  - Pulmonary arteries: Systolic pressure was mildly increased. PA    peak pressure: 38 mm Hg (S).  - Inferior vena cava: The vessel was normal in size. The    respirophasic diameter changes were in the normal range (>= 50%),    consistent with normal central venous pressure.   Recent Labs: 10/02/2022: ALT 17; BUN 25; Creatinine, Ser 1.38; Hemoglobin 14.5; Platelets 192.0; Potassium 4.3; Sodium 142; TSH 2.19  No results found for requested labs within last 365 days.   CrCl cannot be calculated (Patient's most recent lab result is older than the maximum 21 days allowed.).   Wt Readings from Last 3 Encounters:  12/31/22 237 lb (107.5 kg)  10/02/22 233 lb 6 oz (105.9 kg)  05/23/22 254 lb (115.2 kg)     Other studies reviewed: Additional studies/records reviewed today include: summarized above  ASSESSMENT AND PLAN:  Permanent AFib CHA2DS2Vasc is 3, on Eliquis, *** appropriately dosed *** rate *** symptoms  HTN ***  Disposition: F/u with ***  Current medicines are reviewed at length with the patient today.  The patient did not have any concerns regarding medicines.  Norma Fredrickson, PA-C 04/28/2023 3:45 PM     [facility] [address] [place] [address] [phone] (office)  [phone] (fax)

## 2023-04-30 ENCOUNTER — Ambulatory Visit: Payer: Medicare Other | Admitting: Physician Assistant

## 2023-05-01 ENCOUNTER — Ambulatory Visit: Payer: Medicare Other | Attending: Physician Assistant | Admitting: Physician Assistant

## 2023-05-01 VITALS — BP 122/74 | HR 84 | Ht 71.0 in | Wt 244.0 lb

## 2023-05-01 DIAGNOSIS — I4821 Permanent atrial fibrillation: Secondary | ICD-10-CM

## 2023-05-01 DIAGNOSIS — I10 Essential (primary) hypertension: Secondary | ICD-10-CM

## 2023-05-01 DIAGNOSIS — D6869 Other thrombophilia: Secondary | ICD-10-CM

## 2023-05-01 LAB — BASIC METABOLIC PANEL
BUN/Creatinine Ratio: 15 (ref 10–24)
BUN: 23 mg/dL (ref 8–27)
CO2: 27 mmol/L (ref 20–29)
Calcium: 9.3 mg/dL (ref 8.6–10.2)
Chloride: 100 mmol/L (ref 96–106)
Creatinine, Ser: 1.49 mg/dL — ABNORMAL HIGH (ref 0.76–1.27)
Glucose: 81 mg/dL (ref 70–99)
Potassium: 4.2 mmol/L (ref 3.5–5.2)
Sodium: 142 mmol/L (ref 134–144)
eGFR: 48 mL/min/{1.73_m2} — ABNORMAL LOW (ref >59)

## 2023-05-01 LAB — CBC
Hematocrit: 44.3 % (ref 37.5–51.0)
Hemoglobin: 14.9 g/dL (ref 13.0–17.7)
MCH: 29.5 pg (ref 26.6–33.0)
MCHC: 33.6 g/dL (ref 31.5–35.7)
MCV: 88 fL (ref 79–97)
Platelets: 187 10*3/uL (ref 150–450)
RBC: 5.05 x10E6/uL (ref 4.14–5.80)
RDW: 14.6 % (ref 11.6–15.4)
WBC: 7.8 10*3/uL (ref 3.4–10.8)

## 2023-05-01 MED ORDER — POTASSIUM CHLORIDE CRYS ER 20 MEQ PO TBCR: 60.0000 meq | EXTENDED_RELEASE_TABLET | Freq: Every day | ORAL | 3 refills | Status: DC

## 2023-05-01 MED ORDER — APIXABAN 5 MG PO TABS: 5.0000 mg | ORAL_TABLET | Freq: Two times a day (BID) | ORAL | 3 refills | Status: DC

## 2023-05-01 NOTE — Patient Instructions (Signed)
Medication Instructions:   Your physician recommends that you continue on your current medications as directed. Please refer to the Current Medication list given to you today.   *If you need a refill on your cardiac medications before your next appointment, please call your pharmacy*   Lab Work: BMET AND CBC TODAY   If you have labs (blood work) drawn today and your tests are completely normal, you will receive your results only by: MyChart Message (if you have MyChart) OR A paper copy in the mail If you have any lab test that is abnormal or we need to change your treatment, we will call you to review the results.   Testing/Procedures:  NONE ORDERED  TODAY     Follow-Up: At [facility], you and your health needs are our priority.  As part of our continuing mission to provide you with exceptional heart care, we have created designated Provider Care Teams.  These Care Teams include your primary Cardiologist (physician) and Advanced Practice Providers (APPs -  Physician Assistants and Nurse Practitioners) who all work together to provide you with the care you need, when you need it.  We recommend signing up for the patient portal called "MyChart".  Sign up information is provided on this After Visit Summary.  MyChart is used to connect with patients for Virtual Visits (Telemedicine).  Patients are able to view lab/test results, encounter notes, upcoming appointments, etc.  Non-urgent messages can be sent to your provider as well.   To learn more about what you can do with MyChart, go to [email or web address].    Your next appointment:   1 year(s)  Provider:   Sherryl Manges, MD

## 2023-05-03 ENCOUNTER — Telehealth: Payer: Self-pay | Admitting: Internal Medicine

## 2023-05-03 ENCOUNTER — Other Ambulatory Visit: Payer: Self-pay

## 2023-05-03 DIAGNOSIS — I10 Essential (primary) hypertension: Secondary | ICD-10-CM

## 2023-05-03 DIAGNOSIS — D6869 Other thrombophilia: Secondary | ICD-10-CM

## 2023-05-03 DIAGNOSIS — I4821 Permanent atrial fibrillation: Secondary | ICD-10-CM

## 2023-05-03 MED ORDER — POTASSIUM CHLORIDE CRYS ER 20 MEQ PO TBCR: 60.0000 meq | EXTENDED_RELEASE_TABLET | Freq: Every day | ORAL | 3 refills | Status: DC

## 2023-05-03 MED ORDER — APIXABAN 5 MG PO TABS: 5.0000 mg | ORAL_TABLET | Freq: Two times a day (BID) | ORAL | 3 refills | Status: DC

## 2023-05-03 NOTE — Telephone Encounter (Signed)
Pt's medications were resent to pt's preferred pharmacy as requested. Confirmation received. 

## 2023-05-03 NOTE — Telephone Encounter (Signed)
The patient has been notified of the result and verbalized understanding.  All questions (if any) were answered. Pt stated the prescription for Eliquis and Potassium Chloride was sent to the wrong pharmacy. Verified and resent prescriptions to the correct pharmacy. Asencion Gowda, LPN 9/60/4540 9:81 AM

## 2023-05-03 NOTE — Telephone Encounter (Signed)
Pt returning call from yesterday. Please advise 

## 2023-05-03 NOTE — Telephone Encounter (Signed)
*  STAT* If patient is at the pharmacy, call can be transferred to refill team.   1. Which medications need to be refilled? (please list name of each medication and dose if known)   apixaban (ELIQUIS) 5 MG TABS tablet    potassium chloride SA (KLOR-CON M) 20 MEQ tablet    2. Which pharmacy/location (including street and city if local pharmacy) is medication to be sent to? [facility] - [place], [place] - [address]   3. Do they need a 30 day or 90 day supply? 90 day   Refill request was sent to the wrong pharmacy on 5/8

## 2023-05-07 ENCOUNTER — Telehealth: Payer: Self-pay | Admitting: Internal Medicine

## 2023-05-07 NOTE — Telephone Encounter (Signed)
Pt returning call from yesterday regarding lab results. Please advise

## 2023-05-07 NOTE — Telephone Encounter (Signed)
Attempted to call patient back---no answer

## 2023-05-11 ENCOUNTER — Other Ambulatory Visit: Payer: Self-pay | Admitting: Internal Medicine

## 2023-05-13 ENCOUNTER — Other Ambulatory Visit: Payer: Self-pay | Admitting: Family

## 2023-05-17 ENCOUNTER — Encounter: Payer: Medicare Other | Admitting: Internal Medicine

## 2023-05-17 ENCOUNTER — Telehealth: Payer: Self-pay | Admitting: Internal Medicine

## 2023-05-17 DIAGNOSIS — G8929 Other chronic pain: Secondary | ICD-10-CM

## 2023-05-17 MED ORDER — HYDROCODONE-ACETAMINOPHEN 10-325 MG PO TABS: 1.0000 | ORAL_TABLET | Freq: Two times a day (BID) | ORAL | 0 refills | Status: DC | PRN

## 2023-05-17 NOTE — Telephone Encounter (Signed)
Prescription Request  05/17/2023  Is this a "Controlled Substance" medicine? Yes  LOV: Visit date not found   What is the name of the medication or equipment?   Have you contacted your pharmacy to request a refill? No   Which pharmacy would you like this sent to?  [facility] [phone] - [place], [place] - [address] AT [facility] [address] [place] [address] Phone: [phone] Fax: [phone]      Patient notified that their request is being sent to the clinical staff for review and that they should receive a response within 2 business days.   Please advise at Mobile [phone] (mobile)

## 2023-05-17 NOTE — Addendum Note (Signed)
Addended by: Willow Ora E on: 05/17/2023 04:50 PM   Modules accepted: Orders

## 2023-05-17 NOTE — Telephone Encounter (Signed)
Requesting: hydrocodone 10-325mg  Contract: 04/18/21 UDS: 05/23/22 Last Visit: 10/02/22 Next Visit: 06/12/23 Last Refill: 03/18/23 #60 and 0RF  Please Advise

## 2023-05-17 NOTE — Addendum Note (Signed)
Addended byConrad [place] D on: 05/17/2023 04:47 PM   Modules accepted: Orders

## 2023-05-17 NOTE — Telephone Encounter (Signed)
Has a follow-up appointment scheduled. PDMP reviewed.,  Prescription sent

## 2023-05-21 ENCOUNTER — Telehealth: Payer: Self-pay | Admitting: Internal Medicine

## 2023-05-21 NOTE — Telephone Encounter (Signed)
Copied from CRM [id number]. Topic: Medicare AWV >> May 21, 2023 10:34 AM Payton Doughty wrote: Reason for CRM: N/A 05/21/2023 FOR AWV - NO VOICEMAIL  Verlee Rossetti; Care Guide Ambulatory Clinical Support [place] [facility] Direct Dial: [phone]

## 2023-05-23 ENCOUNTER — Other Ambulatory Visit: Payer: Self-pay | Admitting: Internal Medicine

## 2023-05-30 ENCOUNTER — Telehealth: Payer: Self-pay | Admitting: Physician Assistant

## 2023-05-30 DIAGNOSIS — I10 Essential (primary) hypertension: Secondary | ICD-10-CM

## 2023-05-30 DIAGNOSIS — Z79899 Other long term (current) drug therapy: Secondary | ICD-10-CM

## 2023-05-30 NOTE — Telephone Encounter (Signed)
Attempted to call patient. No vm set up, unable to leave message.  Most recent labs from 05/01/23 (attempted to call 5/13 and 5/14 without success). Per Francis Dowse, PA-C:  Creat is elevated, not far from his baseline.  Suggests he is dehydrated.  Recommend he improve his water intake, should repeat BMET in a month to re-evaluate, if not better we can adjust his medications   Need to repeat BMET next week (06/10-06/14). BMET ordered, needs lab appointment.   Received call from operator with patient calling back while typing note.  Patient notified of lab results from 5/8. Lab appt scheduled for 6/12. Patient verbalized understanding.

## 2023-05-30 NOTE — Telephone Encounter (Signed)
  Pt is calling back, he said, he missed a call from [place] 2 days ago. He said, it might be his lab result. He wants to know if he still need to do lab work. order was cancelled

## 2023-06-05 ENCOUNTER — Ambulatory Visit: Payer: Medicare Other | Attending: Physician Assistant

## 2023-06-05 DIAGNOSIS — Z79899 Other long term (current) drug therapy: Secondary | ICD-10-CM

## 2023-06-05 DIAGNOSIS — I10 Essential (primary) hypertension: Secondary | ICD-10-CM

## 2023-06-06 LAB — BASIC METABOLIC PANEL
BUN/Creatinine Ratio: 12 (ref 10–24)
BUN: 18 mg/dL (ref 8–27)
CO2: 27 mmol/L (ref 20–29)
Calcium: 9.3 mg/dL (ref 8.6–10.2)
Chloride: 103 mmol/L (ref 96–106)
Creatinine, Ser: 1.49 mg/dL — ABNORMAL HIGH (ref 0.76–1.27)
Glucose: 106 mg/dL — ABNORMAL HIGH (ref 70–99)
Potassium: 4.7 mmol/L (ref 3.5–5.2)
Sodium: 143 mmol/L (ref 134–144)
eGFR: 48 mL/min/{1.73_m2} — ABNORMAL LOW (ref >59)

## 2023-06-12 ENCOUNTER — Encounter: Payer: Medicare Other | Admitting: Internal Medicine

## 2023-06-17 ENCOUNTER — Telehealth: Payer: Self-pay | Admitting: Internal Medicine

## 2023-06-17 DIAGNOSIS — G8929 Other chronic pain: Secondary | ICD-10-CM

## 2023-06-17 MED ORDER — HYDROCODONE-ACETAMINOPHEN 10-325 MG PO TABS: 1.0000 | ORAL_TABLET | Freq: Two times a day (BID) | ORAL | 0 refills | Status: DC | PRN

## 2023-06-17 NOTE — Addendum Note (Signed)
Addended by: Willow Ora E on: 06/17/2023 01:23 PM   Modules accepted: Orders

## 2023-06-17 NOTE — Telephone Encounter (Signed)
Requesting: hydrocodone 10-325mg   Contract: 04/12/21 UDS: 05/23/22 Last Visit: 10/02/22 Next Visit: 07/23/23 Last Refill: 05/17/23 #60 and 0RF   Please Advise

## 2023-06-17 NOTE — Addendum Note (Signed)
Addended byConrad [place] D on: 06/17/2023 11:51 AM   Modules accepted: Orders

## 2023-06-17 NOTE — Telephone Encounter (Signed)
PDMP okay, Rx sent 

## 2023-06-17 NOTE — Telephone Encounter (Signed)
Patient called and would like a med refill HYDROcodone-acetaminophen (NORCO) 10-325 MG table    please send to [facility] on [address]

## 2023-07-12 ENCOUNTER — Other Ambulatory Visit: Payer: Self-pay | Admitting: Family

## 2023-07-13 ENCOUNTER — Other Ambulatory Visit: Payer: Self-pay | Admitting: Internal Medicine

## 2023-07-18 ENCOUNTER — Telehealth: Payer: Self-pay

## 2023-07-18 NOTE — Telephone Encounter (Signed)
Hydrocodone already on file for July.

## 2023-07-18 NOTE — Telephone Encounter (Signed)
Bettye Boeck hour ago (10:05 AM)   SP Prescription Request   07/18/2023   Is this a "Controlled Substance" medicine? Yes   LOV: Visit date not found ( pt has appt on 07/23/2023; pt was advised of need to keep appt)    What is the name of the medication or equipment?    Have you contacted your pharmacy to request a refill? No    Which pharmacy would you like this sent to?  [facility] [phone] - [place], [place] - [address] AT [facility] [address]  [place] [address] Phone: [phone] Fax: [phone]       Patient notified that their request is being sent to the clinical staff for review and that they should receive a response within 2 business days.    Please advise at Mobile [phone] (mobile)

## 2023-07-18 NOTE — Telephone Encounter (Signed)
Which medication is he needing

## 2023-07-18 NOTE — Telephone Encounter (Signed)
Prescription Request  07/18/2023  Is this a "Controlled Substance" medicine? Yes  LOV: Visit date not found ( pt has appt on 07/23/2023; pt was advised of need to keep appt)   What is the name of the medication or equipment?   Have you contacted your pharmacy to request a refill? No   Which pharmacy would you like this sent to?  [facility] [phone] - [place], [place] - [address] AT [facility] [address] [place] [address] Phone: [phone] Fax: [phone]    Patient notified that their request is being sent to the clinical staff for review and that they should receive a response within 2 business days.   Please advise at Mobile [phone] (mobile)

## 2023-07-23 ENCOUNTER — Encounter: Payer: Self-pay | Admitting: Internal Medicine

## 2023-07-23 ENCOUNTER — Ambulatory Visit (INDEPENDENT_AMBULATORY_CARE_PROVIDER_SITE_OTHER): Payer: Medicare Other | Admitting: Internal Medicine

## 2023-07-23 VITALS — BP 128/86 | HR 84 | Temp 98.1°F | Resp 18 | Ht 71.0 in | Wt 231.0 lb

## 2023-07-23 DIAGNOSIS — R739 Hyperglycemia, unspecified: Secondary | ICD-10-CM | POA: Diagnosis not present

## 2023-07-23 DIAGNOSIS — Z23 Encounter for immunization: Secondary | ICD-10-CM

## 2023-07-23 DIAGNOSIS — R634 Abnormal weight loss: Secondary | ICD-10-CM

## 2023-07-23 DIAGNOSIS — E785 Hyperlipidemia, unspecified: Secondary | ICD-10-CM

## 2023-07-23 DIAGNOSIS — G8929 Other chronic pain: Secondary | ICD-10-CM

## 2023-07-23 DIAGNOSIS — M545 Low back pain, unspecified: Secondary | ICD-10-CM

## 2023-07-23 DIAGNOSIS — Z0001 Encounter for general adult medical examination with abnormal findings: Secondary | ICD-10-CM

## 2023-07-23 DIAGNOSIS — R7989 Other specified abnormal findings of blood chemistry: Secondary | ICD-10-CM

## 2023-07-23 DIAGNOSIS — I10 Essential (primary) hypertension: Secondary | ICD-10-CM | POA: Diagnosis not present

## 2023-07-23 DIAGNOSIS — Z0000 Encounter for general adult medical examination without abnormal findings: Secondary | ICD-10-CM

## 2023-07-23 DIAGNOSIS — B89 Unspecified parasitic disease: Secondary | ICD-10-CM

## 2023-07-23 DIAGNOSIS — E039 Hypothyroidism, unspecified: Secondary | ICD-10-CM

## 2023-07-23 DIAGNOSIS — Z79899 Other long term (current) drug therapy: Secondary | ICD-10-CM

## 2023-07-23 NOTE — Assessment & Plan Note (Addendum)
Here for CPX HTN: BP looks good, continue Cardizem, HCTZ, metoprolol, potassium supplements.  Checking labs Hypothyroidism: Reports good compliance with Synthroid, check TSH Atrial fibrillation: Saw cardiology 05/01/2023, no changes made Increased creatinine: Creatinine is slightly elevated, will recheck.  Also add a micro. Consider renal ultrasound Insomnia: On trazodone, RF as needed Back pain: Hydrocodone, UDS and contract today.  RF when needed. 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  I sent them to the lab for ID.  If unable to ID, will treat empirically w/ Albendazole- 400 mg orally once on empty stomach, repeat in two weeks Denies any foreign travel or unusual foods NTG: Has not used it in years, no documentation of CAD.  Will DC it Weight loss: weight varies , reassess on RTC RTC 6 m

## 2023-07-23 NOTE — Progress Notes (Signed)
Subjective:    Patient ID: Warren [name], male    DOB: [birth year], 77 y.o.   MRN: [id number]  DOS:  07/23/2023 Type of visit - description: CPX Here for CPX Chronic medical problems addressed.  Also, he thinks he has pinworms Has noted some rectal itching from time to time, occasional loose stools. Denies any blood in the stools. No nausea or vomiting. He brought two parasites that he saw in his stools.   Wt Readings from Last 3 Encounters:  07/23/23 231 lb (104.8 kg)  05/01/23 244 lb (110.7 kg)  12/31/22 237 lb (107.5 kg)   BP Readings from Last 3 Encounters:  07/23/23 128/86  05/01/23 122/74  12/31/22 (!) 146/94     Review of Systems  Other than above, a 14 point review of systems is negative       Past Medical History:  Diagnosis Date   GERD (gastroesophageal reflux disease) 01/01/2020   Hypertension    Hypothyroidism    Increased prostate specific antigen (PSA) velocity    Morbid obesity (HCC)    Persistent atrial fibrillation (HCC)    Sleep-disordered breathing    Supraventricular tachycardia    a. s/p concealed left lateral pathway ablation 2002 Dr Graciela Husbands    Past Surgical History:  Procedure Laterality Date   ABLATION  2002   concealed left lateral pathway ablation by Dr Graciela Husbands   APPENDECTOMY     CARDIAC CATHETERIZATION  2001   NO CAD   NM MYOVIEW LTD     12-09   TONSILLECTOMY     Social History   Social History Narrative   Lives by himself , has no family,  for emergency contact he provided today the number of a  neighbor [phone]  [place])              Current Outpatient Medications  Medication Instructions   albuterol (VENTOLIN HFA) 108 (90 Base) MCG/ACT inhaler USE 2 INHALATIONS BY MOUTH EVERY 6 HOURS AS NEEDED FOR WHEEZING  OR SHORTNESS OF BREATH   apixaban (ELIQUIS) 5 mg, Oral, 2 times daily   co-enzyme Q-10 30 mg, Oral, Daily   diltiazem (CARDIZEM CD) 120 mg, Oral, Daily   hydrochlorothiazide (HYDRODIURIL) 25 MG tablet  TAKE 1 TABLET(25 MG) BY MOUTH DAILY AS NEEDED   HYDROcodone-acetaminophen (NORCO) 10-325 MG tablet 1 tablet, Oral, 2 times daily PRN   HYDROcodone-acetaminophen (NORCO) 10-325 MG tablet 1 tablet, Oral, 2 times daily PRN   levothyroxine (SYNTHROID) 100 MCG tablet TAKE 1 TABLET(100 MCG) BY MOUTH DAILY BEFORE BREAKFAST   metoprolol (TOPROL-XL) 200 mg, Oral, Daily, TAKE WITH OR IMMEDIATELY FOLLOWING A MEAL.   Multiple Vitamins-Minerals (CENTRUM SILVER PO) 1 each, Oral, Daily,     omeprazole (PRILOSEC) 40 MG capsule TAKE 1 CAPSULE BY MOUTH DAILY  BEFORE BREAKFAST   potassium chloride SA (KLOR-CON M) 20 MEQ tablet 60 mEq, Oral, Daily   traZODone (DESYREL) 50 MG tablet TAKE 1 AND 1/2 TABLETS(75 MG) BY MOUTH AT BEDTIME AS NEEDED FOR SLEEP   TURMERIC PO 4 capsules, Oral, 2 times daily,         Objective:   Physical Exam BP 128/86   Pulse 84   Temp 98.1 F (36.7 C) (Oral)   Resp 18   Ht 5\' 11"  (1.803 m)   Wt 231 lb (104.8 kg)   SpO2 98%   BMI 32.22 kg/m  General: Well developed, NAD, BMI noted Neck: No  thyromegaly  HEENT:  Normocephalic . Face symmetric, atraumatic Lungs:  CTA B Normal respiratory effort, no intercostal retractions, no accessory muscle use. Heart: Irregularly irregular Abdomen:  Not distended, soft, non-tender. No rebound or rigidity.   Lower extremities: no pretibial edema bilaterally  Skin: Exposed areas without rash. Not pale. Not jaundice Neurologic:  alert & oriented X3.  Speech normal, gait appropriate for age and unassisted Strength symmetric and appropriate for age.  Psych: Cognition and judgment appear intact.  Cooperative with normal attention span and concentration.  Behavior appropriate. No anxious or depressed appearing.     Assessment     ASSESSMENT HTN Hypothyroidism GERD  SVT: Cardiac cath 2001: No CAD, ablation 2002 Persistent atrial fibrillation -- on Eliquis started 08-2015, declines DCCV Insomnia -- Xanax was dc 2015 d/t requiring  increasing doses, was changed to Ativan: didn't work. Was switch to Ambien: He had drowsiness the following day. On trazodone prn H/o  Fatigue Sleep-disordered suspected OSA: declined to have a sleep study Increased PSA velocity: Consistently declined to see urology Obesity Back pain-- hydrocodone prn, rx by pcp    PLAN: Here for CPX - Td 2019 -  pneumonia shot- 2011 ;  prevnar: 2015.   PNM 20 today -Vaccines I recommend: Shingrix, RSV,  COVID vaccines, flu shot every fall. - CCS; prostate, lung cancer screening: has consistently declined   Counseled about : diet, exercise. Still smokes 2 cigars daily, not ready to quit. Labs: see orders Healthcare POA: Information provided HTN: BP looks good, continue Cardizem, HCTZ, metoprolol, potassium supplements.  Checking labs Hypothyroidism: Reports good compliance with Synthroid, check TSH Atrial fibrillation: Saw cardiology 05/01/2023, no changes made Increased creatinine: Creatinine is slightly elevated, will recheck.  Also add a micro. Consider renal ultrasound Insomnia: On trazodone, RF as needed Back pain: Hydrocodone, UDS and contract today.  RF when needed. 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  I sent them to the lab for ID.  If unable to ID, will treat empirically w/ Albendazole- 400 mg orally once on empty stomach, repeat in two weeks Denies any foreign travel or unusual foods NTG: Has not used it in years, no documentation of CAD.  Will DC it Weight loss: weight varies , reassess on RTC RTC 6 m

## 2023-07-23 NOTE — Assessment & Plan Note (Signed)
Here for CPX - Td 2019 -  pneumonia shot- 2011 ;  prevnar: 2015.   PNM 20 today -Vaccines I recommend: Shingrix, RSV,  COVID vaccines, flu shot every fall. - CCS; prostate, lung cancer screening: has consistently declined   Counseled about : diet, exercise. Still smokes 2 cigars daily, not ready to quit. Labs: see orders Healthcare POA: Information provided

## 2023-07-23 NOTE — Patient Instructions (Addendum)
Vaccines I recommend: Shingrix (shingles) Covid booster RSV vaccine Flu shot this fall  Check the  blood pressure regularly BP GOAL is between 110/65 and  135/85. If it is consistently higher or lower, let me know      GO TO THE LAB : Get the blood work     GO TO THE FRONT DESK, PLEASE SCHEDULE YOUR APPOINTMENTS Come back for   a checkup in 6 months    "Health Care Power of attorney" ,  "Living will" (Advance care planning documents)  If you already have a living will or healthcare power of attorney, is recommended you bring the copy to be scanned in your chart.   The document will be available to all the doctors you see in the system.  Advance care planning is a process that supports adults in  understanding and sharing their preferences regarding future medical care.  The patient's preferences are recorded in documents called Advance Directives and the can be modified at any time while the patient is in full mental capacity.   If you don't have one, please consider create one.      More information at: [email or web address]

## 2023-08-19 ENCOUNTER — Telehealth: Payer: Self-pay | Admitting: Internal Medicine

## 2023-08-19 DIAGNOSIS — G8929 Other chronic pain: Secondary | ICD-10-CM

## 2023-08-19 MED ORDER — HYDROCODONE-ACETAMINOPHEN 10-325 MG PO TABS: 1.0000 | ORAL_TABLET | Freq: Two times a day (BID) | ORAL | 0 refills | Status: DC | PRN

## 2023-08-19 NOTE — Telephone Encounter (Signed)
PDMP okay, Rx sent 

## 2023-08-19 NOTE — Telephone Encounter (Signed)
Requesting: hydrocodone 10-325mg   Contract: 07/23/23 UDS: 07/23/23 Last Visit: 07/23/23 Next Visit: None Last Refill: 06/17/23 #60 and 0RF (X2)  Please Advise

## 2023-08-19 NOTE — Addendum Note (Signed)
Addended by: Willow Ora E on: 08/19/2023 01:28 PM   Modules accepted: Orders

## 2023-08-19 NOTE — Telephone Encounter (Signed)
Prescription Request  08/19/2023  Is this a "Controlled Substance" medicine? Yes  LOV: 07/23/2023  What is the name of the medication or equipment?  HYDROcodone-acetaminophen (NORCO) 10-325 MG tablet  Have you contacted your pharmacy to request a refill? No   Which pharmacy would you like this sent to?    [facility] [phone] - [place], [place] - [address] AT [facility] OF [address] [place] [address] Phone: [phone] Fax: [phone]    Patient notified that their request is being sent to the clinical staff for review and that they should receive a response within 2 business days.   Please advise at Mobile [phone] (mobile)

## 2023-08-19 NOTE — Addendum Note (Signed)
Addended byConrad [place] D on: 08/19/2023 01:03 PM   Modules accepted: Orders

## 2023-08-21 ENCOUNTER — Other Ambulatory Visit: Payer: Self-pay | Admitting: Internal Medicine

## 2023-08-21 MED ORDER — ALBENDAZOLE 200 MG PO TABS: 400.0000 mg | ORAL_TABLET | Freq: Every day | ORAL | 0 refills | Status: DC

## 2023-09-14 ENCOUNTER — Other Ambulatory Visit: Payer: Self-pay | Admitting: Internal Medicine

## 2023-09-23 ENCOUNTER — Telehealth: Payer: Self-pay | Admitting: Internal Medicine

## 2023-09-23 NOTE — Telephone Encounter (Signed)
Prescription Request  09/23/2023  Is this a "Controlled Substance" medicine? Yes  LOV: 07/23/2023  What is the name of the medication or equipment?   HYDROcodone-acetaminophen (NORCO) 10-325 MG tablet [[id number]]  Have you contacted your pharmacy to request a refill? No   Which pharmacy would you like this sent to?   [facility] [phone] - [place], [place] - [address] AT [facility] [address] [place] [address] Phone: [phone] Fax: [phone]  Patient notified that their request is being sent to the clinical staff for review and that they should receive a response within 2 business days.   Please advise at Mobile [phone] (mobile)

## 2023-09-23 NOTE — Telephone Encounter (Signed)
Refill already on file for 08/2023.

## 2023-09-25 ENCOUNTER — Other Ambulatory Visit: Payer: Self-pay | Admitting: Internal Medicine

## 2023-10-28 ENCOUNTER — Telehealth: Payer: Self-pay | Admitting: Internal Medicine

## 2023-10-28 DIAGNOSIS — G8929 Other chronic pain: Secondary | ICD-10-CM

## 2023-10-28 NOTE — Addendum Note (Signed)
Addended byConrad [place] D on: 10/28/2023 08:41 AM   Modules accepted: Orders

## 2023-10-28 NOTE — Telephone Encounter (Signed)
Prescription Request  10/28/2023  Is this a "Controlled Substance" medicine? Yes  LOV: 07/23/2023  What is the name of the medication or equipment?   HYDROcodone-acetaminophen (NORCO) 10-325 MG tablet [[id number]]  Have you contacted your pharmacy to request a refill? No   Which pharmacy would you like this sent to?   [facility] [phone] - [place], [place] - [address] AT [facility] [address] [place] [address] Phone: [phone] Fax: [phone]  Patient notified that their request is being sent to the clinical staff for review and that they should receive a response within 2 business days.   Please advise at Mobile [phone] (mobile)

## 2023-10-28 NOTE — Telephone Encounter (Signed)
Requesting: hydrocodone 10-325mg  Contract: 07/23/23 UDS: 07/23/23 Last Visit: 07/23/23 Next Visit: None Last Refill: 08/19/23 #60 and 0RF (x2)  Please Advise

## 2023-10-29 MED ORDER — HYDROCODONE-ACETAMINOPHEN 10-325 MG PO TABS: 1.0000 | ORAL_TABLET | Freq: Two times a day (BID) | ORAL | 0 refills | Status: DC | PRN

## 2023-10-29 MED ORDER — HYDROCODONE-ACETAMINOPHEN 10-325 MG PO TABS
1.0000 | ORAL_TABLET | Freq: Two times a day (BID) | ORAL | 0 refills | Status: DC | PRN
Start: 2023-10-29 — End: 2024-02-10

## 2023-10-29 NOTE — Telephone Encounter (Signed)
Pdmp ok, rx sent  ? ?

## 2023-10-29 NOTE — Addendum Note (Signed)
Addended by: Wanda Plump on: 10/29/2023 06:39 AM   Modules accepted: Orders

## 2023-11-03 ENCOUNTER — Other Ambulatory Visit: Payer: Self-pay | Admitting: Internal Medicine

## 2023-11-10 ENCOUNTER — Other Ambulatory Visit: Payer: Self-pay | Admitting: Internal Medicine

## 2023-11-11 ENCOUNTER — Encounter: Payer: Self-pay | Admitting: Internal Medicine

## 2023-11-11 NOTE — Telephone Encounter (Signed)
error 

## 2023-11-14 ENCOUNTER — Encounter: Payer: Self-pay | Admitting: Internal Medicine

## 2023-12-09 ENCOUNTER — Telehealth: Payer: Self-pay | Admitting: Internal Medicine

## 2023-12-09 NOTE — Telephone Encounter (Signed)
Copied from CRM [id number]. Topic: Medicare AWV >> Dec 09, 2023 10:37 AM Payton Doughty wrote: Reason for CRM: Called 12/09/2023 to sched Annual Wellness Visit - NO VOICEMAIL  Verlee Rossetti; Care Guide Ambulatory Clinical Support [place] [facility] Direct Dial: [phone]

## 2023-12-17 ENCOUNTER — Other Ambulatory Visit: Payer: Self-pay | Admitting: Internal Medicine

## 2023-12-17 DIAGNOSIS — G8929 Other chronic pain: Secondary | ICD-10-CM

## 2023-12-17 NOTE — Telephone Encounter (Signed)
Hydrocodone already on file for December 2024.

## 2023-12-17 NOTE — Telephone Encounter (Signed)
Copied from CRM [id number]. Topic: Clinical - Medication Refill >> Dec 17, 2023 11:56 AM Elizebeth Brooking wrote: Most Recent Primary Care Visit:  Provider: Willow Ora E  Department: [facility]  Visit Type: PHYSICAL  Date: 07/23/2023  Medication: ***  Has the patient contacted their pharmacy?  (Agent: If no, request that the patient contact the pharmacy for the refill. If patient does not wish to contact the pharmacy document the reason why and proceed with request.) (Agent: If yes, when and what did the pharmacy advise?)  Is this the correct pharmacy for this prescription?  If no, delete pharmacy and type the correct one.  This is the patient's preferred pharmacy:  [facility] [phone] - [place], [place] - [address] AT [facility] [address] [place] [address] Phone: [phone] Fax: [phone]  [facility] - [place], [place] - [address] [place] [address] Phone: [phone] Fax: [phone]   Has the prescription been filled recently?   Is the patient out of the medication?   Has the patient been seen for an appointment in the last year OR does the patient have an upcoming appointment?   Can we respond through MyChart?   Agent: Please be advised that Rx refills may take up to 3 business days. We ask that you follow-up with your pharmacy.

## 2023-12-20 NOTE — Telephone Encounter (Signed)
Pended both medications.  Hydrocodone last written 10/29/2023 #60 no refills Albenza not on medication list.      Copied from CRM [id number]. Topic: Clinical - Medication Question >> Dec 20, 2023  3:43 PM Melissa C wrote: Reason for CRM: patient called to check on status of refills for HYDROcodone-acetaminophen (NORCO) 10-325 MG tablet and albendazole (ALBENZA) 200 MG tablet. I checked and it looked like Hydrocodone order was pending but may have been cancelled (did not voice this to patient) regardless, patient would like a call back as soon as possible please

## 2023-12-21 ENCOUNTER — Other Ambulatory Visit: Payer: Self-pay | Admitting: Internal Medicine

## 2023-12-23 NOTE — Telephone Encounter (Signed)
Hydrocodone already on file for December

## 2023-12-30 ENCOUNTER — Telehealth (INDEPENDENT_AMBULATORY_CARE_PROVIDER_SITE_OTHER): Payer: Medicare Other | Admitting: Medical

## 2023-12-30 DIAGNOSIS — R195 Other fecal abnormalities: Secondary | ICD-10-CM

## 2023-12-30 DIAGNOSIS — B839 Helminthiasis, unspecified: Secondary | ICD-10-CM | POA: Diagnosis not present

## 2023-12-30 MED ORDER — ALBENDAZOLE 200 MG PO TABS: ORAL_TABLET | ORAL | 0 refills | Status: DC

## 2023-12-30 NOTE — Progress Notes (Addendum)
 Subjective:    Patient ID: Warren [name], male    DOB: [birth year], 78 y.o.   MRN: [id number]  HPI   Virtual Visit via Telephone Note  I connected with Warren [name] on 12/30/23 at 11:00 AM EST by telephone and verified that I am speaking with the correct person using two identifiers.  Location: Patient: home Provider: office   I discussed the limitations, risks, security and privacy concerns of performing an evaluation and management service by telephone and the availability of in person appointments. I also discussed with the patient that there may be a patient responsible charge related to this service. The patient expressed understanding and agreed to proceed.      History of Present Illness:  Discussed the use of AI scribe software for clinical note transcription with the patient, who gave verbal consent to proceed.  History of Present Illness   The patient, previously treated with albendazole  for a parasitic infection, reports persistent but less severe symptoms. Initially,  this summer in august he experienced rectal itching and observed small white worms in his stool. The patient notes that the activity of the worms seems to be temperature-dependent, with increased activity during the summer months. Currently, the patient reports a decrease in the number of visible worms in the stool, from approximately 100 to  only 5-10. He denies any specific timing or triggers for the itching. The patient has been self-monitoring his condition by regularly examining his stool. He expresses concern about the potential impact of the parasites on his overall health. The patient's ability to seek in-person care is limited due to transportation challenges and associated costs. He states cost to come in by [place] would be $100.          Pt has hx of treatment with albendazole  back in 07-23-2023. But o and p study result showed.  Parasit Specimen submitted is not a worm or arthropod of medical  or public health importance.   I don't see prior history notes at time he was advised to get albendazole .   Observations/Objective: General- normal speech. Doe not sound labored. Pleasant during conversation.  Assessment and Plan:  Assessment and Plan    Parasitic Infection Recurrent symptoms of pruritus and presence of small white worms in stool, suggestive of possible pinworm infection. Previous treatment with Albendazole  in August 2024 provided partial relief. -Prescribe Albendazole  200mg , two tablets on day one and repeat in two weeks if necessary. -Consider stool ova and parasite exam if symptoms persist after treatment. Due to patient's transportation limitations, discussed the possibility of mailing the kit, but acknowledged potential delays in diagnosis and treatment. He expressed unwillingness to make arrangements to come in, he explained cost of transportation and appears not want to delay treatment pending submission of stool/worm samples.   Follow up date to be determined. Explained if tx does not resolve symptoms then would need to follow thru with o and p test order placed today.        Time spent with patient today was  25 minutes which consisted of chart rediew, discussing diagnosis, work up treatment and documentation.  Follow Up Instructions:    I discussed the assessment and treatment plan with the patient. The patient was provided an opportunity to ask questions and all were answered. The patient agreed with the plan and demonstrated an understanding of the instructions.   The patient was advised to call back or seek an in-person evaluation if the symptoms worsen or if the condition fails to  improve as anticipated.     Dallas Maxwell, PA-C    Review of Systems  Constitutional:  Negative for chills, fatigue and fever.  Respiratory:  Negative for cough, chest tightness, shortness of breath and wheezing.   Cardiovascular:  Negative for chest pain and  palpitations.  Gastrointestinal:  Negative for abdominal pain, blood in stool, constipation and nausea.       See hpi.  Genitourinary:  Negative for dysuria and flank pain.  Skin:  Negative for rash.  Neurological:  Negative for dizziness and headaches.       Objective:   Physical Exam        Assessment & Plan:

## 2023-12-30 NOTE — Patient Instructions (Signed)
 Parasitic Infection Recurrent symptoms of pruritus and presence of small white worms in stool, suggestive of possible pinworm infection. Previous treatment with Albendazole  in August 2024 provided partial relief. -Prescribe Albendazole  200mg , two tablets on day one and repeat in two weeks if necessary. -Consider stool ova and parasite exam if symptoms persist after treatment. Due to patient's transportation limitations, discussed the possibility of mailing the kit, but acknowledged potential delays in diagnosis and treatment. He expressed unwillingness to make arrangements to come in, he explained cost of transportation and appears not want to delay treatment pending submission of stool/worm samples.   Follow up date to be determined. Explained if tx does not resolve symptoms then would need to follow thru with o and p test order placed today.

## 2024-01-08 ENCOUNTER — Other Ambulatory Visit: Payer: Self-pay | Admitting: Internal Medicine

## 2024-01-10 ENCOUNTER — Other Ambulatory Visit: Payer: Self-pay | Admitting: Internal Medicine

## 2024-01-13 ENCOUNTER — Other Ambulatory Visit: Payer: Self-pay

## 2024-01-13 MED ORDER — LEVOTHYROXINE SODIUM 100 MCG PO TABS: 100.0000 ug | ORAL_TABLET | Freq: Every day | ORAL | 0 refills | Status: DC

## 2024-01-13 MED ORDER — HYDROCHLOROTHIAZIDE 25 MG PO TABS: 25.0000 mg | ORAL_TABLET | Freq: Every day | ORAL | 0 refills | Status: DC | PRN

## 2024-01-17 ENCOUNTER — Other Ambulatory Visit: Payer: Self-pay | Admitting: Internal Medicine

## 2024-02-07 ENCOUNTER — Telehealth: Payer: Self-pay | Admitting: Internal Medicine

## 2024-02-07 DIAGNOSIS — G8929 Other chronic pain: Secondary | ICD-10-CM

## 2024-02-07 NOTE — Telephone Encounter (Signed)
Called pt to schedule an appt with PCP, no answer and no VM available to inform pt that he is needing an appt with his provider.

## 2024-02-07 NOTE — Telephone Encounter (Signed)
Pt needs appt.

## 2024-02-07 NOTE — Telephone Encounter (Signed)
Copied from CRM [id number]. Topic: Clinical - Medication Refill >> Feb 07, 2024 10:48 AM Myrtice Lauth wrote: Most Recent Primary Care Visit:  Provider: Esperanza Richters  Department: [facility]  Visit Type: ACUTE  Date: 12/30/2023  Medication: HYDROcodone-acetaminophen (NORCO) 10-325 MG tablet  Has the patient contacted their pharmacy? Yes (Agent: If no, request that the patient contact the pharmacy for the refill. If patient does not wish to contact the pharmacy document the reason why and proceed with request.) (Agent: If yes, when and what did the pharmacy advise?)  Is this the correct pharmacy for this prescription? Yes If no, delete pharmacy and type the correct one.  This is the patient's preferred pharmacy:  [facility] [phone] - [place], [place] - [address] AT [facility] OF [address] [place] [address] Phone: [phone] Fax: [phone]     Has the prescription been filled recently? Yes  Is the patient out of the medication? Yes  Has the patient been seen for an appointment in the last year OR does the patient have an upcoming appointment? Yes  Can we respond through MyChart? Yes  Agent: Please be advised that Rx refills may take up to 3 business days. We ask that you follow-up with your pharmacy.

## 2024-02-10 ENCOUNTER — Telehealth: Payer: Self-pay

## 2024-02-10 ENCOUNTER — Other Ambulatory Visit: Payer: Self-pay | Admitting: Internal Medicine

## 2024-02-10 DIAGNOSIS — G8929 Other chronic pain: Secondary | ICD-10-CM

## 2024-02-10 MED ORDER — HYDROCODONE-ACETAMINOPHEN 10-325 MG PO TABS: 1.0000 | ORAL_TABLET | Freq: Two times a day (BID) | ORAL | 0 refills | Status: DC | PRN

## 2024-02-10 NOTE — Telephone Encounter (Signed)
Last Fill: 10/29/23 60 tab/0 refill  Last OV: 12/30/23 Next OV: None Scheduled  Routing to provider for review/authorization.

## 2024-02-10 NOTE — Telephone Encounter (Signed)
Called pt and no answer, pt needing to be informed rx was sent to pharmacy but pt still is needing to be seen in office per provider.

## 2024-02-10 NOTE — Telephone Encounter (Signed)
Advise patient I sent a Rx but needs to be seen

## 2024-02-10 NOTE — Addendum Note (Signed)
Addended by: Willow Ora E on: 02/10/2024 12:54 PM   Modules accepted: Orders

## 2024-02-10 NOTE — Telephone Encounter (Signed)
Copied from CRM [id number]. Topic: Clinical - Prescription Issue >> Feb 10, 2024 12:38 PM Gurney Maxin H wrote: Reason for CRM: Patient called for a refill request for his HYDROcodone-acetaminophen (NORCO) 10-325 MG tablet, advised patient per notes he needs to come in for an appointment with provider and patient is declining to schedule an appointment, states it's hard for him to get in and when he comes in he has to pay over a 100 dollars, please reach out to patient, thanks.   Romie [phone]

## 2024-02-10 NOTE — Telephone Encounter (Signed)
Copied from CRM [id number]. Topic: Clinical - Medication Refill >> Feb 10, 2024 12:33 PM Marica Otter wrote: Most Recent Primary Care Visit:  Provider: Esperanza Richters  Department: [facility]  Visit Type: ACUTE  Date: 12/30/2023  Medication: HYDROcodone-acetaminophen (NORCO) 10-325 MG tablet  Has the patient contacted their pharmacy? Yes, Contact provider controlled substance (Agent: If no, request that the patient contact the pharmacy for the refill. If patient does not wish to contact the pharmacy document the reason why and proceed with request.) (Agent: If yes, when and what did the pharmacy advise?)  Is this the correct pharmacy for this prescription? Yes If no, delete pharmacy and type the correct one.  This is the patient's preferred pharmacy:  [facility] [phone] - [place], [place] - [address] AT [facility] [address] [place] [address] Phone: [phone] Fax: [phone]    Has the prescription been filled recently? No  Is the patient out of the medication? Yes  Has the patient been seen for an appointment in the last year OR does the patient have an upcoming appointment? Yes  Can we respond through MyChart? No  Agent: Please be advised that Rx refills may take up to 3 business days. We ask that you follow-up with your pharmacy.

## 2024-02-10 NOTE — Telephone Encounter (Signed)
Pt refusing to schedule appt, requesting refill on hydrocodone only.

## 2024-02-28 ENCOUNTER — Other Ambulatory Visit: Payer: Self-pay | Admitting: Internal Medicine

## 2024-03-07 ENCOUNTER — Other Ambulatory Visit: Payer: Self-pay | Admitting: Internal Medicine

## 2024-03-12 ENCOUNTER — Telehealth: Payer: Self-pay | Admitting: Internal Medicine

## 2024-03-12 DIAGNOSIS — G8929 Other chronic pain: Secondary | ICD-10-CM

## 2024-03-12 NOTE — Telephone Encounter (Unsigned)
 Copied from CRM [id number]. Topic: Clinical - Medication Refill >> Mar 12, 2024  2:26 PM Isabell A wrote: Most Recent Primary Care Visit:  Provider: Esperanza Richters  Department: [facility]  Visit Type: ACUTE  Date: 12/30/2023  Medication: HYDROcodone-acetaminophen (NORCO) 10-325 MG tablet   Has the patient contacted their pharmacy? No (Agent: If no, request that the patient contact the pharmacy for the refill. If patient does not wish to contact the pharmacy document the reason why and proceed with request.) (Agent: If yes, when and what did the pharmacy advise?)  Is this the correct pharmacy for this prescription? Yes If no, delete pharmacy and type the correct one.  This is the patient's preferred pharmacy:  [facility] [phone] - [place], [place] - [address] AT [facility] [address] [place] [address] Phone: [phone] Fax: [phone]  Has the prescription been filled recently? Yes  Is the patient out of the medication? No  Has the patient been seen for an appointment in the last year OR does the patient have an upcoming appointment? Yes  Can we respond through MyChart? No  Agent: Please be advised that Rx refills may take up to 3 business days. We ask that you follow-up with your pharmacy.

## 2024-03-13 NOTE — Telephone Encounter (Signed)
Will need appt for further refills.

## 2024-03-16 ENCOUNTER — Telehealth: Payer: Self-pay | Admitting: Internal Medicine

## 2024-03-16 NOTE — Telephone Encounter (Signed)
 Pt scheduled

## 2024-03-16 NOTE — Telephone Encounter (Signed)
 Copied from CRM [id number]. Topic: Appointments - Appointment Scheduling >> Mar 16, 2024  3:04 PM Alcus Dad wrote: Patient/patient representative is calling to schedule an appointment. Please call patient back regarding medication and scheduling

## 2024-03-18 ENCOUNTER — Encounter: Payer: Self-pay | Admitting: Internal Medicine

## 2024-03-18 ENCOUNTER — Ambulatory Visit (INDEPENDENT_AMBULATORY_CARE_PROVIDER_SITE_OTHER): Admitting: Internal Medicine

## 2024-03-18 VITALS — BP 126/82 | HR 82 | Temp 98.1°F | Resp 18 | Ht 71.0 in | Wt 261.2 lb

## 2024-03-18 DIAGNOSIS — E785 Hyperlipidemia, unspecified: Secondary | ICD-10-CM

## 2024-03-18 DIAGNOSIS — Z79899 Other long term (current) drug therapy: Secondary | ICD-10-CM

## 2024-03-18 DIAGNOSIS — J449 Chronic obstructive pulmonary disease, unspecified: Secondary | ICD-10-CM

## 2024-03-18 DIAGNOSIS — G8929 Other chronic pain: Secondary | ICD-10-CM | POA: Diagnosis not present

## 2024-03-18 DIAGNOSIS — E039 Hypothyroidism, unspecified: Secondary | ICD-10-CM

## 2024-03-18 DIAGNOSIS — I10 Essential (primary) hypertension: Secondary | ICD-10-CM | POA: Diagnosis not present

## 2024-03-18 DIAGNOSIS — M549 Dorsalgia, unspecified: Secondary | ICD-10-CM | POA: Diagnosis not present

## 2024-03-18 MED ORDER — BUDESONIDE-FORMOTEROL FUMARATE 160-4.5 MCG/ACT IN AERO: 2.0000 | INHALATION_SPRAY | Freq: Two times a day (BID) | RESPIRATORY_TRACT | 0 refills | Status: DC

## 2024-03-18 MED ORDER — HYDROCODONE-ACETAMINOPHEN 10-325 MG PO TABS: 1.0000 | ORAL_TABLET | Freq: Two times a day (BID) | ORAL | 0 refills | Status: DC | PRN

## 2024-03-18 MED ORDER — BUDESONIDE-FORMOTEROL FUMARATE 160-4.5 MCG/ACT IN AERO: 2.0000 | INHALATION_SPRAY | Freq: Two times a day (BID) | RESPIRATORY_TRACT | 3 refills | Status: DC

## 2024-03-18 NOTE — Patient Instructions (Addendum)
 We are ordering a lung test, PFTs. Please reach out to pulmonary at [phone] and get that scheduled.  Emphysema: - Start Symbicort 2 puffs twice daily every day. - You can continue using albuterol as needed - Do every effort to stop tobacco   Check the  blood pressure regularly Blood pressure goal:  between 110/65 and  135/85. If it is consistently higher or lower, let me know    GO TO THE LAB : Get the blood work     Please go to the front desk: Arrange for a follow-up in 3 months

## 2024-03-18 NOTE — Progress Notes (Unsigned)
 Subjective:    Patient ID: Warren [name], male    DOB: 1946/01/01, 78 y.o.   MRN: [id number]  DOS:  03/18/2024 Type of visit - description: Follow-up  Chronic medical problems addressed. He has inhaler, states he is using it daily for difficulty breathing. He admits to SOB for at least a year, + DOE. Admits to some cough increasing. After albuterol, he cough some and bringing up clear mucus.  Denies chest pain or palpitations No edema.   Review of Systems See above   Past Medical History:  Diagnosis Date   GERD (gastroesophageal reflux disease) 01/01/2020   Hypertension    Hypothyroidism    Increased prostate specific antigen (PSA) velocity    Morbid obesity (HCC)    Persistent atrial fibrillation (HCC)    Sleep-disordered breathing    Supraventricular tachycardia (HCC)    a. s/p concealed left lateral pathway ablation 2002 Dr Graciela Husbands    Past Surgical History:  Procedure Laterality Date   ABLATION  2002   concealed left lateral pathway ablation by Dr Graciela Husbands   APPENDECTOMY     CARDIAC CATHETERIZATION  2001   NO CAD   NM MYOVIEW LTD     12-09   TONSILLECTOMY      Current Outpatient Medications  Medication Instructions   albendazole (ALBENZA) 200 MG tablet 2 tab po day 1 and then repeat in 2 weeks if necessary   albuterol (VENTOLIN HFA) 108 (90 Base) MCG/ACT inhaler 2 puffs, Inhalation, Every 6 hours PRN   apixaban (ELIQUIS) 5 mg, Oral, 2 times daily   co-enzyme Q-10 30 mg, Daily   diltiazem (CARDIZEM CD) 120 mg, Oral, Daily   hydrochlorothiazide (HYDRODIURIL) 25 mg, Oral, Daily PRN   HYDROcodone-acetaminophen (NORCO) 10-325 MG tablet 1 tablet, Oral, 2 times daily PRN   levothyroxine (SYNTHROID) 100 mcg, Oral, Daily before breakfast   metoprolol (TOPROL-XL) 200 mg, Oral, Daily, TAKE WITH OR IMMEDIATELY FOLLOWING A MEAL.   Multiple Vitamins-Minerals (CENTRUM SILVER PO) 1 each, Daily   omeprazole (PRILOSEC) 40 mg, Oral, Daily before breakfast   potassium chloride SA  (KLOR-CON M) 20 MEQ tablet 60 mEq, Oral, Daily   traZODone (DESYREL) 50 MG tablet TAKE 1 AND 1/2 TABLETS(75 MG) BY MOUTH AT BEDTIME AS NEEDED FOR SLEEP   TURMERIC PO 4 capsules, 2 times daily       Objective:   Physical Exam BP 126/82   Pulse 82   Temp 98.1 F (36.7 C) (Oral)   Resp 18   Ht 5\' 11"  (1.803 m)   Wt 261 lb 4 oz (118.5 kg) Comment: had big coat on today  SpO2 92%   BMI 36.44 kg/m  General:   Well developed, NAD, BMI noted. HEENT:  Normocephalic . Face symmetric, atraumatic Neck: No JVD Lungs:  Decreased breath sounds.  Increased expiratory time Normal respiratory effort, no intercostal retractions, no accessory muscle use. Heart: Irregular irregular Lower extremities: Trace pretibial edema bilaterally  Skin: Not pale. Not jaundice Neurologic:  alert & oriented X3.  Speech normal, gait appropriate for age and unassisted Psych--  Cognition and judgment appear intact.  Cooperative with normal attention span and concentration.  Behavior appropriate. No anxious or depressed appearing.      Assessment     ASSESSMENT HTN Hypothyroidism GERD  CV: SVT: Cardiac cath 2001: No CAD, ablation 2002 Persistent atrial fibrillation -- on Eliquis started 08-2015, declines DCCV Insomnia -- Xanax was dc 2015 d/t requiring increasing doses, was changed to Ativan: didn't work. Was switch to  Ambien: He had drowsiness the following day. On trazodone prn H/o  Fatigue Sleep-disordered suspected OSA: declined to have a sleep study Increased PSA velocity: Consistently declined to see urology Obesity Back pain-- hydrocodone prn, rx by pcp    PLAN:  COPD: Report SOB/DOE for at least a year.  Had a chest x-ray at the [facility] January 2024, no acute findings. He is a smoker, has chronic cough, wheezing, relieved by albuterol.  He likely has COPD. Although DOE is likely multifactorial I think emphysema is playing a role. Plan: CBC, PFTs, Symbicort twice daily, albuterol as needed, make  every effort to stop tobacco.  Also, I asked him to let me know if Symbicort is not making any difference in the next couple of weeks.  If that is the case he will need further evaluation. HTN: BP looks good, continue Cardizem, HCTZ, metoprolol, potassium, check BMP. Hypothyroidism: Synthroid, check TSH Back pain: PDMP okay, hydrocodone refilled. RTC 3 months.   7 Here for CPX - Td 2019 -  pneumonia shot- 2011 ;  prevnar: 2015.   PNM 20 today -Vaccines I recommend: Shingrix, RSV,  COVID vaccines, flu shot every fall. - CCS; prostate, lung cancer screening: has consistently declined   Counseled about : diet, exercise. Still smokes 2 cigars daily, not ready to quit. Labs: see orders Healthcare POA: Information provided HTN: BP looks good, continue Cardizem, HCTZ, metoprolol, potassium supplements.  Checking labs Hypothyroidism: Reports good compliance with Synthroid, check TSH Atrial fibrillation: Saw cardiology 05/01/2023, no changes made Increased creatinine: Creatinine is slightly elevated, will recheck.  Also add a micro. Consider renal ultrasound Insomnia: On trazodone, RF as needed Back pain: Hydrocodone, UDS and contract today.  RF when needed. 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  I sent them to the lab for ID.  If unable to ID, will treat empirically w/ Albendazole- 400 mg orally once on empty stomach, repeat in two weeks Denies any foreign travel or unusual foods NTG: Has not used it in years, no documentation of CAD.  Will DC it Weight loss: weight varies , reassess on RTC RTC 6 m

## 2024-03-19 LAB — CBC WITH DIFFERENTIAL/PLATELET
Basophils Absolute: 0.1 10*3/uL (ref 0.0–0.1)
Basophils Relative: 0.9 % (ref 0.0–3.0)
Eosinophils Absolute: 0.2 10*3/uL (ref 0.0–0.7)
Eosinophils Relative: 2 % (ref 0.0–5.0)
HCT: 44.6 % (ref 39.0–52.0)
Hemoglobin: 14.4 g/dL (ref 13.0–17.0)
Lymphocytes Relative: 17.6 % (ref 12.0–46.0)
Lymphs Abs: 1.5 10*3/uL (ref 0.7–4.0)
MCHC: 32.3 g/dL (ref 30.0–36.0)
MCV: 90.2 fl (ref 78.0–100.0)
Monocytes Absolute: 0.7 10*3/uL (ref 0.1–1.0)
Monocytes Relative: 8.3 % (ref 3.0–12.0)
Neutro Abs: 6.2 10*3/uL (ref 1.4–7.7)
Neutrophils Relative %: 71.2 % (ref 43.0–77.0)
Platelets: 214 10*3/uL (ref 150.0–400.0)
RBC: 4.94 Mil/uL (ref 4.22–5.81)
RDW: 15.7 % — ABNORMAL HIGH (ref 11.5–15.5)
WBC: 8.7 10*3/uL (ref 4.0–10.5)

## 2024-03-19 LAB — BASIC METABOLIC PANEL WITH GFR
BUN: 26 mg/dL — ABNORMAL HIGH (ref 6–23)
CO2: 30 meq/L (ref 19–32)
Calcium: 9.1 mg/dL (ref 8.4–10.5)
Chloride: 105 meq/L (ref 96–112)
Creatinine, Ser: 1.27 mg/dL (ref 0.40–1.50)
GFR: 53.91 mL/min — ABNORMAL LOW (ref >60.00)
Glucose, Bld: 98 mg/dL (ref 70–99)
Potassium: 4.7 meq/L (ref 3.5–5.1)
Sodium: 141 meq/L (ref 135–145)

## 2024-03-19 NOTE — Assessment & Plan Note (Signed)
 DOE, likely COPD Report SOB/DOE for at least a year.  Had a chest x-ray at the [facility] January 2024, no acute findings. He is a smoker, has chronic cough, wheezing, relieved by albuterol.    Although DOE is likely multifactorial I think emphysema is playing a role. Plan: CBC, PFTs, Symbicort twice daily, albuterol as needed, make every effort to stop tobacco.  Also, I asked him to let me know if Symbicort is not making any difference in the next couple of weeks.  If that is the case he will need further evaluation. HTN: BP looks good, continue Cardizem, HCTZ, metoprolol, potassium, check BMP. Hypothyroidism: Synthroid, check TSH Back pain: PDMP okay, hydrocodone refilled. RTC 3 months.

## 2024-03-20 LAB — TSH: TSH: 2.65 u[IU]/mL (ref 0.35–5.50)

## 2024-03-21 ENCOUNTER — Other Ambulatory Visit: Payer: Self-pay | Admitting: Internal Medicine

## 2024-03-21 LAB — DRUG MONITORING PANEL 375977 , URINE
Alcohol Metabolites: NEGATIVE ng/mL (ref <500)
Amphetamines: NEGATIVE ng/mL (ref <500)
Barbiturates: NEGATIVE ng/mL (ref <300)
Benzodiazepines: NEGATIVE ng/mL (ref <100)
Cocaine Metabolite: NEGATIVE ng/mL (ref <150)
Codeine: NEGATIVE ng/mL (ref <50)
Desmethyltramadol: NEGATIVE ng/mL (ref <100)
Hydrocodone: 956 ng/mL — ABNORMAL HIGH (ref <50)
Hydromorphone: 537 ng/mL — ABNORMAL HIGH (ref <50)
Marijuana Metabolite: NEGATIVE ng/mL (ref <20)
Morphine: NEGATIVE ng/mL (ref <50)
Norhydrocodone: 594 ng/mL — ABNORMAL HIGH (ref <50)
Opiates: POSITIVE ng/mL — AB (ref <100)
Oxycodone: NEGATIVE ng/mL (ref <100)
Tramadol: NEGATIVE ng/mL (ref <100)

## 2024-03-21 LAB — DM TEMPLATE

## 2024-03-23 ENCOUNTER — Encounter: Payer: Self-pay | Admitting: *Deleted

## 2024-04-01 ENCOUNTER — Encounter: Payer: Self-pay | Admitting: Internal Medicine

## 2024-04-04 ENCOUNTER — Other Ambulatory Visit: Payer: Self-pay | Admitting: Physician Assistant

## 2024-04-04 ENCOUNTER — Other Ambulatory Visit: Payer: Self-pay | Admitting: Internal Medicine

## 2024-04-04 DIAGNOSIS — I10 Essential (primary) hypertension: Secondary | ICD-10-CM

## 2024-04-04 DIAGNOSIS — I4821 Permanent atrial fibrillation: Secondary | ICD-10-CM

## 2024-04-04 DIAGNOSIS — D6869 Other thrombophilia: Secondary | ICD-10-CM

## 2024-04-06 NOTE — Telephone Encounter (Signed)
 Prescription refill request for Eliquis received. Indication:afib Last office visit:5/24 Scr:1.27  3/25 Age: 78 Weight:118.5  kg  Prescription refilled

## 2024-04-11 ENCOUNTER — Other Ambulatory Visit: Payer: Self-pay | Admitting: Internal Medicine

## 2024-04-26 ENCOUNTER — Other Ambulatory Visit: Payer: Self-pay | Admitting: Physician Assistant

## 2024-04-26 DIAGNOSIS — D6869 Other thrombophilia: Secondary | ICD-10-CM

## 2024-04-26 DIAGNOSIS — I10 Essential (primary) hypertension: Secondary | ICD-10-CM

## 2024-04-26 DIAGNOSIS — I4821 Permanent atrial fibrillation: Secondary | ICD-10-CM

## 2024-05-09 ENCOUNTER — Other Ambulatory Visit: Payer: Self-pay | Admitting: Internal Medicine

## 2024-05-11 ENCOUNTER — Telehealth: Payer: Self-pay | Admitting: Internal Medicine

## 2024-05-11 DIAGNOSIS — G8929 Other chronic pain: Secondary | ICD-10-CM

## 2024-05-11 NOTE — Telephone Encounter (Signed)
 Copied from CRM [id number]. Topic: Clinical - Medication Refill >> May 11, 2024  9:07 AM [place] E wrote: Medication: HYDROcodone -acetaminophen  (NORCO) 10-325 MG tablet   Has the patient contacted their pharmacy? No (Agent: If no, request that the patient contact the pharmacy for the refill. If patient does not wish to contact the pharmacy document the reason why and proceed with request.) (Agent: If yes, when and what did the pharmacy advise?)  This is the patient's preferred pharmacy:  [facility] [phone] - [place], [place] - [address] AT [facility] [address] [place] [address] Phone: [phone] Fax: [phone]   Is this the correct pharmacy for this prescription? Yes If no, delete pharmacy and type the correct one.   Has the prescription been filled recently? No  Is the patient out of the medication? Yes  Has the patient been seen for an appointment in the last year OR does the patient have an upcoming appointment? Yes  Can we respond through MyChart? No  Agent: Please be advised that Rx refills may take up to 3 business days. We ask that you follow-up with your pharmacy.

## 2024-05-11 NOTE — Telephone Encounter (Signed)
 Requesting: NORCO Contract:07/31/2023 UDS:03/18/2024 Last Visit:03/18/24 Next Visit:n/a Last Refill:03/18/24  Please Advise

## 2024-05-12 MED ORDER — HYDROCODONE-ACETAMINOPHEN 10-325 MG PO TABS: 1.0000 | ORAL_TABLET | Freq: Two times a day (BID) | ORAL | 0 refills | Status: DC | PRN

## 2024-05-12 NOTE — Telephone Encounter (Signed)
 PDMP okay, Rx sent

## 2024-05-28 ENCOUNTER — Other Ambulatory Visit: Payer: Self-pay | Admitting: Physician Assistant

## 2024-05-28 DIAGNOSIS — I4821 Permanent atrial fibrillation: Secondary | ICD-10-CM

## 2024-05-28 DIAGNOSIS — I10 Essential (primary) hypertension: Secondary | ICD-10-CM

## 2024-05-28 DIAGNOSIS — D6869 Other thrombophilia: Secondary | ICD-10-CM

## 2024-06-06 ENCOUNTER — Other Ambulatory Visit: Payer: Self-pay | Admitting: Internal Medicine

## 2024-06-13 ENCOUNTER — Other Ambulatory Visit: Payer: Self-pay | Admitting: Internal Medicine

## 2024-06-17 ENCOUNTER — Telehealth: Payer: Self-pay

## 2024-06-17 NOTE — Telephone Encounter (Signed)
 Klor-con  prescribed by Charlies Arthur at cardiology. Informed E2C2 that agent will need to have Pt contact cardiology office for refills.

## 2024-06-17 NOTE — Telephone Encounter (Signed)
 Copied from CRM [id number]. Topic: Clinical - Prescription Issue >> Jun 17, 2024 11:41 AM [place] A wrote: Reason for CRM: Patient would like for nurse to call him regarding potassium chloride  SA (KLOR-CON  M) 20 MEQ tablet; Patient said [facility] said they can not get medicine from [place] and [facility] said he needs appointment for medication. -Please contact

## 2024-06-18 ENCOUNTER — Telehealth: Payer: Self-pay | Admitting: Internal Medicine

## 2024-06-18 DIAGNOSIS — I4821 Permanent atrial fibrillation: Secondary | ICD-10-CM

## 2024-06-18 DIAGNOSIS — D6869 Other thrombophilia: Secondary | ICD-10-CM

## 2024-06-18 DIAGNOSIS — I10 Essential (primary) hypertension: Secondary | ICD-10-CM

## 2024-06-18 MED ORDER — POTASSIUM CHLORIDE CRYS ER 20 MEQ PO TBCR
60.0000 meq | EXTENDED_RELEASE_TABLET | Freq: Every day | ORAL | 1 refills | Status: DC
Start: 2024-06-18 — End: 2024-07-30

## 2024-06-18 NOTE — Telephone Encounter (Signed)
*  STAT* If patient is at the pharmacy, call can be transferred to refill team.   1. Which medications need to be refilled? (please list name of each medication and dose if known)   potassium chloride  SA (KLOR-CON  M) 20 MEQ tablet    4. Which pharmacy/location (including street and city if local pharmacy) is medication to be sent to?  [facility] [phone] - [place], [place] - [address] Phone: [phone]  Fax: [phone]       5. Do they need a 30 day or 90 day supply? 90  Pt scheduled for 07/23/24

## 2024-06-18 NOTE — Telephone Encounter (Signed)
 Pt's medication was sent to pt's pharmacy as requested. Confirmation received.

## 2024-06-24 ENCOUNTER — Other Ambulatory Visit: Payer: Self-pay | Admitting: Internal Medicine

## 2024-06-24 DIAGNOSIS — I4821 Permanent atrial fibrillation: Secondary | ICD-10-CM

## 2024-06-24 DIAGNOSIS — I10 Essential (primary) hypertension: Secondary | ICD-10-CM

## 2024-06-24 DIAGNOSIS — D6869 Other thrombophilia: Secondary | ICD-10-CM

## 2024-06-25 ENCOUNTER — Telehealth: Payer: Self-pay | Admitting: Internal Medicine

## 2024-06-25 DIAGNOSIS — G8929 Other chronic pain: Secondary | ICD-10-CM

## 2024-06-25 MED ORDER — HYDROCODONE-ACETAMINOPHEN 10-325 MG PO TABS: 1.0000 | ORAL_TABLET | Freq: Two times a day (BID) | ORAL | 0 refills | Status: DC | PRN

## 2024-06-25 NOTE — Telephone Encounter (Signed)
PDMP okay, prescription sent 

## 2024-06-25 NOTE — Telephone Encounter (Signed)
 Requesting: hydrocodone  10-325mg   Contract: 07/31/23 UDS: 03/18/24 Last Visit: 03/18/24 Next Visit: None Last Refill: see med list   Please Advise

## 2024-06-25 NOTE — Telephone Encounter (Signed)
 Copied from CRM [id number]. Topic: Clinical - Medication Refill >> Jun 25, 2024 12:24 PM Mesmerise C wrote: Medication:  HYDROcodone -acetaminophen  (NORCO) 10-325 MG tablet    Has the patient contacted their pharmacy? Yes (Agent: If no, request that the patient contact the pharmacy for the refill. If patient does not wish to contact the pharmacy document the reason why and proceed with request.) (Agent: If yes, when and what did the pharmacy advise?) Stated they sent a request This is the patient's preferred pharmacy:  [facility] [phone] - [place], [place] - [address] AT [facility] [address] [place] [address] Phone: [phone] Fax: [phone]   Is this the correct pharmacy for this prescription? Yes If no, delete pharmacy and type the correct one.   Has the prescription been filled recently? No  Is the patient out of the medication? Yes  Has the patient been seen for an appointment in the last year OR does the patient have an upcoming appointment? Yes  Can we respond through MyChart? No  Agent: Please be advised that Rx refills may take up to 3 business days. We ask that you follow-up with your pharmacy.

## 2024-06-30 ENCOUNTER — Telehealth: Payer: Self-pay | Admitting: Internal Medicine

## 2024-06-30 DIAGNOSIS — G8929 Other chronic pain: Secondary | ICD-10-CM

## 2024-06-30 NOTE — Telephone Encounter (Signed)
 Copied from CRM [id number]. Topic: Clinical - Prescription Issue >> Jun 30, 2024  9:08 AM Avram MATSU wrote: Reason for CRM: patient sent in a request for a refill for HYDROcodone -acetaminophen  (NORCO) 10-325 MG tablet [[id number]]. It was sent on 7/3 but patient stated the pharmacy does not have it.   [facility] [phone] - [place], [place] - [address] AT [facility] [address] [place] [address] Phone: [phone]  Fax: [phone] >> Jun 30, 2024  9:35 AM Treva T wrote: Patient calling back, with an update, states per preferred pharmacy[facility], states medication for HYDROcodone -acetaminophen  (NORCO) 10-325 MG tablet was received, for July 2025, and August 2025, but never received a prescription for June.  Per patient states he is not able to pick up prescription per pharmacy for July 2025, until after 07/12/2024.  Patient is calling to inquire how he can get medication that will get him up until that time.  Patient best method to be reached at [phone] via text.  Preferred pharmacy:   [facility] [phone] - [place], [place] - [address] AT [facility] [address]  [address] [place] [address]  Phone: [phone] Fax: [phone]

## 2024-07-01 NOTE — Telephone Encounter (Signed)
 Spoke w/ [facility], informed they were unable to fill because it doesn't actually have a release date on it. Informed we've always sent prescriptions this way and haven't had an issue. Asked if PCP needed to send another prescription, I was informed no, they will take the verbal to go ahead and fill it today, then they hung up on me.

## 2024-07-01 NOTE — Telephone Encounter (Signed)
 PDMP reviewed. He was dispensed 60 tablets on 05/12/2024. I sent a prescription for hydrocodone  on 06/25/2024 to his preferred pharmacy. I do not see a reason why the pharmacy would not dispense it.  Please advise pharmacy to do so

## 2024-07-07 ENCOUNTER — Ambulatory Visit (INDEPENDENT_AMBULATORY_CARE_PROVIDER_SITE_OTHER): Admitting: Pulmonary Disease

## 2024-07-07 ENCOUNTER — Encounter: Payer: Self-pay | Admitting: Pulmonary Disease

## 2024-07-07 VITALS — BP 156/95 | HR 78 | Ht 71.0 in | Wt 254.6 lb

## 2024-07-07 DIAGNOSIS — R0609 Other forms of dyspnea: Secondary | ICD-10-CM | POA: Diagnosis not present

## 2024-07-07 DIAGNOSIS — J449 Chronic obstructive pulmonary disease, unspecified: Secondary | ICD-10-CM | POA: Diagnosis not present

## 2024-07-07 DIAGNOSIS — Z87891 Personal history of nicotine dependence: Secondary | ICD-10-CM | POA: Diagnosis not present

## 2024-07-07 MED ORDER — BREZTRI AEROSPHERE 160-9-4.8 MCG/ACT IN AERO: INHALATION_SPRAY | RESPIRATORY_TRACT | Status: AC

## 2024-07-07 NOTE — Patient Instructions (Signed)
 Nice to meet you  I think your shortness of breath is likely related to several small issues that have accumulated into 1 large symptom  Admits that Symbicort  would help, based on your chest x-ray I think is hyperinflation.  This can be caused related to damage from smoking or from something like asthma.  Stop Symbicort , use Breztri  2 puffs twice a day every day.  Rinse your mouth out with water after every use.  Continue use albuterol  as needed  I ordered pulmonary function test for further evaluation, please schedule these at your earliest convenience when you leave  Please send me a message in 2 weeks and let me know if the inhaler helps or not, if it helps I will send a prescription.  Return to clinic in 3 months or sooner as needed with Dr. Annella

## 2024-07-07 NOTE — Progress Notes (Signed)
 @Patient  ID: Warren [name], male    DOB: January 14, 1946, 78 y.o.   MRN: [id number]  Chief Complaint  Patient presents with   Consult    Referred by PCP for emphysema.     Referring provider: Amon Aloysius BRAVO, MD  HPI:   78 y.o. man whom are seen for evaluation of dyspnea on exertion.  Multiple PCP notes reviewed.  Multiple cardiology notes reviewed.  Patient notes several years ago he could walk 5 miles without issue.  He has become progressively short of breath over the last several years.  Really would last couple years.  Now minimally active.  Relatively sedentary.  Not walking.  Notes he is in permanent atrial fibrillation.  Notes he feels like he cannot take a deep breath his stomach stops him, there is a barrier.  Former smoker, cigar a day.  Quit recently.  Had echocardiogram in 2016 that showed severely dilated LA, minimal MVR, normal RV size and function, mildly dilated RA, mildly elevated estimated PASP.  We discussed at length the likely multiple etiologies of her shortness of breath.  Likely that in isolation each 1 is not a big deal but in combination they produce severe symptoms.  Fortunately symptoms are better when using Symbicort .  2 puffs twice a day.  Also using albuterol  4 times a day.  He finds interval improvement albeit mild with every inhaler use.  We discussed his chest x-ray, most recent chest imaging 12/2018 for my review interpretation reveals clear lungs and hyperinflation of the lateral.  Discussed likely air trapping versus hyperinflation causing shortness of breath.  Which inhalers would help.  This could be related to asthma or smoking-related lung disease which we discussed.  We discussed further diagnostic modalities.  Trying to improve his symptoms we can be more active.  Which I think ultimately will certainly help.  But we could be limited and there will probably be a limit to what we can achieve.    Questionaires / Pulmonary Flowsheets:   ACT:      No data to  display          MMRC:     No data to display          Epworth:      No data to display          Tests:   FENO:  No results found for: NITRICOXIDE  PFT:     No data to display          WALK:      No data to display          Imaging: Personally reviewed and as per EMR in discussion this note No results found.  Lab Results: Personally reviewed CBC    Component Value Date/Time   WBC 8.7 03/18/2024 1435   RBC 4.94 03/18/2024 1435   HGB 14.4 03/18/2024 1435   HGB 14.9 05/01/2023 1000   HCT 44.6 03/18/2024 1435   HCT 44.3 05/01/2023 1000   PLT 214.0 03/18/2024 1435   PLT 187 05/01/2023 1000   MCV 90.2 03/18/2024 1435   MCV 88 05/01/2023 1000   MCH 29.5 05/01/2023 1000   MCHC 32.3 03/18/2024 1435   RDW 15.7 (H) 03/18/2024 1435   RDW 14.6 05/01/2023 1000   LYMPHSABS 1.5 03/18/2024 1435   MONOABS 0.7 03/18/2024 1435   EOSABS 0.2 03/18/2024 1435   BASOSABS 0.1 03/18/2024 1435    BMET    Component Value Date/Time   NA 141 03/18/2024 1435  NA 143 06/05/2023 1326   K 4.7 03/18/2024 1435   CL 105 03/18/2024 1435   CO2 30 03/18/2024 1435   GLUCOSE 98 03/18/2024 1435   GLUCOSE 99 11/26/2006 1608   BUN 26 (H) 03/18/2024 1435   BUN 18 06/05/2023 1326   CREATININE 1.27 03/18/2024 1435   CREATININE 1.64 (H) 08/17/2020 1438   CALCIUM 9.1 03/18/2024 1435   GFRNONAA 53 (L) 01/29/2019 1613   GFRAA 61 01/29/2019 1613    BNP No results found for: BNP  ProBNP    Component Value Date/Time   PROBNP 194.0 (H) 03/16/2009 0445    Specialty Problems   None   No Known Allergies  Immunization History  Administered Date(s) Administered   Fluad Quad(high Dose 65+) 01/01/2020, 10/18/2021, 10/02/2022   Influenza Whole 12/24/2005, 10/12/2008, 12/13/2009, 09/13/2010   Influenza, High Dose Seasonal PF 10/31/2015, 10/16/2017, 11/28/2018   Influenza,inj,Quad PF,6+ Mos 10/25/2014   PFIZER(Purple Top)SARS-COV-2 Vaccination 07/18/2020, 08/09/2020,  02/15/2021   PNEUMOCOCCAL CONJUGATE-20 07/23/2023   Pneumococcal Conjugate-13 04/21/2014   Pneumococcal Polysaccharide-23 12/14/2010, 10/16/2017   Td 10/12/2008, 11/28/2018    Past Medical History:  Diagnosis Date   GERD (gastroesophageal reflux disease) 01/01/2020   Hypertension    Hypothyroidism    Increased prostate specific antigen (PSA) velocity    Morbid obesity (HCC)    Persistent atrial fibrillation (HCC)    Sleep-disordered breathing    Supraventricular tachycardia (HCC)    a. s/p concealed left lateral pathway ablation 2002 Dr Fernande    Tobacco History: Social History   Tobacco Use  Smoking Status Former   Types: Cigars   Quit date: 06/30/2024   Years since quitting: 0.0  Smokeless Tobacco Never   Counseling given: Not Answered   Continue to not smoke  Outpatient Encounter Medications as of 07/07/2024  Medication Sig   albuterol  (VENTOLIN  HFA) 108 (90 Base) MCG/ACT inhaler Inhale 2 puffs into the lungs every 6 (six) hours as needed for wheezing or shortness of breath.   budesonide -formoterol  (SYMBICORT ) 160-4.5 MCG/ACT inhaler Inhale 2 puffs into the lungs 2 (two) times daily.   budesonide -glycopyrrolate-formoterol  (BREZTRI  AEROSPHERE) 160-9-4.8 MCG/ACT AERO inhaler 4 samples   co-enzyme Q-10 30 MG capsule Take 30 mg by mouth daily.   diltiazem  (CARDIZEM  CD) 120 MG 24 hr capsule TAKE 1 CAPSULE BY MOUTH DAILY   ELIQUIS  5 MG TABS tablet TAKE 1 TABLET BY MOUTH TWICE  DAILY   hydrochlorothiazide  (HYDRODIURIL ) 25 MG tablet Take 1 tablet (25 mg total) by mouth daily as needed.   HYDROcodone -acetaminophen  (NORCO) 10-325 MG tablet Take 1 tablet by mouth 2 (two) times daily as needed for moderate pain (pain score 4-6).   HYDROcodone -acetaminophen  (NORCO) 10-325 MG tablet Take 1 tablet by mouth 2 (two) times daily as needed for moderate pain (pain score 4-6).   levothyroxine  (SYNTHROID ) 100 MCG tablet TAKE 1 TABLET BY MOUTH DAILY  BEFORE BREAKFAST   metoprolol  (TOPROL -XL) 200  MG 24 hr tablet Take 1 tablet (200 mg total) by mouth daily. Take with or immediately following a meal   Multiple Vitamins-Minerals (CENTRUM SILVER PO) Take 1 each by mouth daily.   omeprazole  (PRILOSEC) 40 MG capsule Take 1 capsule (40 mg total) by mouth daily before breakfast.   potassium chloride  SA (KLOR-CON  M) 20 MEQ tablet Take 3 tablets (60 mEq total) by mouth daily.   traZODone  (DESYREL ) 50 MG tablet TAKE 1 AND 1/2 TABLETS(75 MG) BY MOUTH AT BEDTIME AS NEEDED FOR SLEEP   TURMERIC PO Take 4 capsules by mouth 2 (  two) times daily.   No facility-administered encounter medications on file as of 07/07/2024.     Review of Systems  Review of Systems  No chest pain with exertion.  No orthopnea or PND.  Comprehensive review of systems otherwise negative. Physical Exam  BP (!) 156/95 (BP Location: Right Arm, Patient Position: Sitting, Cuff Size: Large)   Pulse 78   Ht 5' 11 (1.803 m)   Wt 254 lb 9.6 oz (115.5 kg)   SpO2 95%   BMI 35.51 kg/m   Wt Readings from Last 5 Encounters:  07/07/24 254 lb 9.6 oz (115.5 kg)  03/18/24 261 lb 4 oz (118.5 kg)  07/23/23 231 lb (104.8 kg)  05/01/23 244 lb (110.7 kg)  12/31/22 237 lb (107.5 kg)    BMI Readings from Last 5 Encounters:  07/07/24 35.51 kg/m  03/18/24 36.44 kg/m  07/23/23 32.22 kg/m  05/01/23 34.03 kg/m  12/31/22 33.05 kg/m     Physical Exam General: Sitting in chair, no acute distress Eyes: EOMI, no icterus Neck: Supple, no JVP Pulmonary: Clear, no work of breathing Cardiovascular: Irregularly irregular, warm Abdomen: Nondistended MSK: No synovitis, no joint effusion Neuro: Normal gait, no weakness Psych: Normal mood, full affect   Assessment & Plan:   Dyspnea on exertion: Suspect multifactorial.  Related to deconditioning, much less active than prior.  Related to atrial fibrillation.  Related to habitus, truncal obesity.  Related to likely diastolic dysfunction given severely dilated left atrium in 2016 with  minimal or minor valvular dysfunction at that time.  As well as development of likely pulmonary hypertension was evidence by estimated elevated PASP in 2016.  Likely 2 disease with dilated left atrium, pulmonary venous hypertension etc.  Suspect damage from tobacco smoking.  PFTs for further evaluation.  Chest x-ray is hyperinflated, as would be expected.  Symbicort  has helped which is encouraging.  Stop Symbicort , start Breztri .  Assess response.  Continue albuterol  as needed.   Return in about 3 months (around 10/07/2024) for f/u Dr. Annella.   Donnice JONELLE Annella, MD 07/07/2024   This appointment required 61 minutes of patient care (this includes precharting, chart review, review of results, face-to-face care, etc.).

## 2024-07-30 ENCOUNTER — Encounter: Payer: Self-pay | Admitting: Physician Assistant

## 2024-07-30 ENCOUNTER — Ambulatory Visit: Attending: Physician Assistant | Admitting: Physician Assistant

## 2024-07-30 VITALS — BP 124/82 | HR 97 | Ht 71.0 in | Wt 247.0 lb

## 2024-07-30 DIAGNOSIS — I4821 Permanent atrial fibrillation: Secondary | ICD-10-CM | POA: Diagnosis not present

## 2024-07-30 DIAGNOSIS — I10 Essential (primary) hypertension: Secondary | ICD-10-CM

## 2024-07-30 DIAGNOSIS — R42 Dizziness and giddiness: Secondary | ICD-10-CM

## 2024-07-30 DIAGNOSIS — R0789 Other chest pain: Secondary | ICD-10-CM | POA: Diagnosis not present

## 2024-07-30 DIAGNOSIS — D6869 Other thrombophilia: Secondary | ICD-10-CM

## 2024-07-30 DIAGNOSIS — R0602 Shortness of breath: Secondary | ICD-10-CM

## 2024-07-30 MED ORDER — APIXABAN 5 MG PO TABS: 5.0000 mg | ORAL_TABLET | Freq: Two times a day (BID) | ORAL | 3 refills | Status: AC

## 2024-07-30 MED ORDER — METOPROLOL SUCCINATE ER 200 MG PO TB24: 200.0000 mg | ORAL_TABLET | Freq: Every day | ORAL | 3 refills | Status: AC

## 2024-07-30 MED ORDER — POTASSIUM CHLORIDE CRYS ER 20 MEQ PO TBCR: 60.0000 meq | EXTENDED_RELEASE_TABLET | Freq: Every day | ORAL | 3 refills | Status: DC

## 2024-07-30 NOTE — Patient Instructions (Addendum)
 Medication Instructions:   Your physician recommends that you continue on your current medications as directed. Please refer to the Current Medication list given to you today.  *If you need a refill on your cardiac medications before your next appointment, please call your pharmacy*    Lab Work:  PLEASE GO DOWN STAIRS  [facility]  FIRST FLOOR   ( GET OFF ELEVATORS WALK TOWARDS WAITING AREA LAB LOCATED BY PHARMACY): BMET AND   CBC TOAY      If you have labs (blood work) drawn today and your tests are completely normal, you will receive your results only by: MyChart Message (if you have MyChart) OR A paper copy in the mail If you have any lab test that is abnormal or we need to change your treatment, we will call you to review the results.    Testing/Procedures: NONE ORDERED  TODAY      Follow-Up: At [facility], you and your health needs are our priority.  As part of our continuing mission to provide you with exceptional heart care, our providers are all part of one team.  This team includes your primary Cardiologist (physician) and Advanced Practice Providers or APPs (Physician Assistants and Nurse Practitioners) who all work together to provide you with the care you need, when you need it.   Your next appointment:     IN  3 month(s) WITH  ANY AVAILABLE  PRIMARY  CARDIOLOGIST  ACCEPTING NEW PATIENTS    We recommend signing up for the patient portal called MyChart.  Sign up information is provided on this After Visit Summary.  MyChart is used to connect with patients for Virtual Visits (Telemedicine).  Patients are able to view lab/test results, encounter notes, upcoming appointments, etc.  Non-urgent messages can be sent to your provider as well.   To learn more about what you can do with MyChart, go to [email or web address].   Other Instructions

## 2024-07-30 NOTE — Progress Notes (Signed)
 Cardiology Office Note Date:  07/30/2024  Patient ID:  Warren [name], Warren [name] 1946-10-16, MRN [id number] PCP:  Amon Aloysius BRAVO, MD  Electrophysiologist: Dr. Fernande    Chief Complaint:   annual visit  History of Present Illness: Warren [name] is a 78 y.o. male with history of  SVT (ablated remotely),  HTN, hypothyroidism, epmhysema AFib (permanent)   Saw Dr. Fernande 10/312/22, difficulty with back pain, poor sleep, some DOE.  Afib described as permanent, planned to f/u with Dr. Amon and EP as needed  I saw him May 2024 He feels fine His limiter mostly is his back, tends to be fairly sedentary. He denies palpitations, no cardiac awareness He has some occasional gas pains, can feel/hear the gurgling in his belly, belching/passing gas makes him feel better No difficulties with ADLs, gets a little winded with heavier then usual activities No near syncope or syncope. No bleeding or signs of bleeding  Referred to pulm recently for progressive SOB, suspected multifactorial (deconditioned, AFib, asthma/ephysema (prior smoker) obesity) improved with Symbicort  Planned for PFTs Symbicort  changed to Breztri   TODAY  He reports doing OK He c/w SOB, though with the inhalers he is able to get a good deep breath in now and that is an improvement, says his O2 sats have been found 89% sometimes He plane to go forward with the recommended tests, but mentions he does not think he will want to drag around oxygen if he were to need it  CP: locates LLQ/abdomen that moves up to his chest when he beds forward > he thinks is a gas bubble. Palpitations: feels like he has bubbles in his chest sometimes, no racing Orthostatic dizziness, drinks primarily soda, a little water and will have a drink (ETOH) now and then No syncope  Needs refills Reports good medication compliance No bleeding or signs of bleeding    AAD hx Flecainide  remotely, stopped 2/2 persistent arrhythmia (pt declined DCCV/rhythm  control)   Past Medical History:  Diagnosis Date   GERD (gastroesophageal reflux disease) 01/01/2020   Hypertension    Hypothyroidism    Increased prostate specific antigen (PSA) velocity    Morbid obesity (HCC)    Persistent atrial fibrillation (HCC)    Sleep-disordered breathing    Supraventricular tachycardia (HCC)    a. s/p concealed left lateral pathway ablation 2002 Dr Fernande    Past Surgical History:  Procedure Laterality Date   ABLATION  2002   concealed left lateral pathway ablation by Dr Fernande   APPENDECTOMY     CARDIAC CATHETERIZATION  2001   NO CAD   NM MYOVIEW LTD     12-09   TONSILLECTOMY      Current Outpatient Medications  Medication Sig Dispense Refill   albuterol  (VENTOLIN  HFA) 108 (90 Base) MCG/ACT inhaler Inhale 2 puffs into the lungs every 6 (six) hours as needed for wheezing or shortness of breath. 54 g 0   budesonide -formoterol  (SYMBICORT ) 160-4.5 MCG/ACT inhaler Inhale 2 puffs into the lungs 2 (two) times daily. 3 each 0   budesonide -glycopyrrolate-formoterol  (BREZTRI  AEROSPHERE) 160-9-4.8 MCG/ACT AERO inhaler 4 samples     co-enzyme Q-10 30 MG capsule Take 30 mg by mouth daily.     diltiazem  (CARDIZEM  CD) 120 MG 24 hr capsule TAKE 1 CAPSULE BY MOUTH DAILY 90 capsule 3   ELIQUIS  5 MG TABS tablet TAKE 1 TABLET BY MOUTH TWICE  DAILY 180 tablet 3   hydrochlorothiazide  (HYDRODIURIL ) 25 MG tablet Take 1 tablet (25 mg total) by  mouth daily as needed. 90 tablet 0   HYDROcodone -acetaminophen  (NORCO) 10-325 MG tablet Take 1 tablet by mouth 2 (two) times daily as needed for moderate pain (pain score 4-6). 60 tablet 0   HYDROcodone -acetaminophen  (NORCO) 10-325 MG tablet Take 1 tablet by mouth 2 (two) times daily as needed for moderate pain (pain score 4-6). 60 tablet 0   levothyroxine  (SYNTHROID ) 100 MCG tablet TAKE 1 TABLET BY MOUTH DAILY  BEFORE BREAKFAST 90 tablet 3   metoprolol  (TOPROL -XL) 200 MG 24 hr tablet Take 1 tablet (200 mg total) by mouth daily. Take with  or immediately following a meal 90 tablet 0   Multiple Vitamins-Minerals (CENTRUM SILVER PO) Take 1 each by mouth daily.     omeprazole  (PRILOSEC) 40 MG capsule Take 1 capsule (40 mg total) by mouth daily before breakfast. 90 capsule 0   potassium chloride  SA (KLOR-CON  M) 20 MEQ tablet Take 3 tablets (60 mEq total) by mouth daily. 90 tablet 1   traZODone  (DESYREL ) 50 MG tablet TAKE 1 AND 1/2 TABLETS(75 MG) BY MOUTH AT BEDTIME AS NEEDED FOR SLEEP 45 tablet 0   TURMERIC PO Take 4 capsules by mouth 2 (two) times daily.     No current facility-administered medications for this visit.    Allergies:   Patient has no known allergies.   Social History:  The patient  reports that he quit smoking about 4 weeks ago. His smoking use included cigars. He has never used smokeless tobacco. He reports current alcohol use. He reports that he does not use drugs.   Family History:  The patient's He was adopted. Family history is unknown by patient.  ROS:  Please see the history of present illness.    All other systems are reviewed and otherwise negative.   PHYSICAL EXAM:  VS:  There were no vitals taken for this visit. BMI: There is no height or weight on file to calculate BMI. Well nourished, well developed, in no acute distress HEENT: normocephalic, atraumatic Neck: no JVD, carotid bruits or masses Cardiac: irreg-irreg; no significant murmurs, no rubs, or gallops Lungs: CTA b/l, no wheezing, rhonchi or rales Abd: soft, nontender MS: no deformity or atrophy Ext: trace if any edema Skin: warm and dry, no rash Neuro:  No gross deficits appreciated Psych: euthymic mood, full affect   EKG:  Done today and reviewed by myself shows  AFib,  97bpm, PVC, appears similar to last   12/12/2015: TTE Left ventricle: The cavity size was normal. Systolic function was    normal. The estimated ejection fraction was in the range of 55%    to 60%. Wall motion was normal; there were no regional wall    motion  abnormalities.  - Mitral valve: Calcified annulus.  - Left atrium: The atrium was severely dilated.  - Right ventricle: The cavity size was normal. Wall thickness was    normal.  - Right atrium: The atrium was mildly dilated.  - Tricuspid valve: There was trivial regurgitation.  - Pulmonary arteries: Systolic pressure was mildly increased. PA    peak pressure: 38 mm Hg (S).  - Inferior vena cava: The vessel was normal in size. The    respirophasic diameter changes were in the normal range (>= 50%),    consistent with normal central venous pressure.   Recent Labs: 03/18/2024: BUN 26; Creatinine, Ser 1.27; Hemoglobin 14.4; Platelets 214.0; Potassium 4.7; Sodium 141; TSH 2.65  No results found for requested labs within last 365 days.   CrCl cannot  be calculated (Patient's most recent lab result is older than the maximum 21 days allowed.).   Wt Readings from Last 3 Encounters:  07/07/24 254 lb 9.6 oz (115.5 kg)  03/18/24 261 lb 4 oz (118.5 kg)  07/23/23 231 lb (104.8 kg)     Other studies reviewed: Additional studies/records reviewed today include: summarized above  ASSESSMENT AND PLAN:  Permanent AFib CHA2DS2Vasc is 3, on Eliquis , appropriately dosed controlled rate Likely the source of his sense of bubbles across his chest  Labs today  HTN Looks good  3. Secondary hypercoagulable state  4. CP  Sounds atypical  5. SOB Inhalers have given his some improvement Plum w/u is underway Will update his echo  6. Orthostatic lightheadedness Urged less soda, more water, minimize ETOH   Disposition:  we discussed Dr. Celine retirement, need for a new MD.  I dont think he needs EP going forward, will refer to our general cardiology team.  Plan for return in 42mo, sooner if needed, pending echo  Current medicines are reviewed at length with the patient today.  The patient did not have any concerns regarding medicines.  Bonney Charlies Arthur, PA-C 07/30/2024 7:06 AM     [facility]  [facility] [address] [place] [address] [phone] (office)  [phone] (fax)

## 2024-07-31 ENCOUNTER — Other Ambulatory Visit: Payer: Self-pay | Admitting: Internal Medicine

## 2024-07-31 LAB — BASIC METABOLIC PANEL WITH GFR
BUN/Creatinine Ratio: 18 (ref 10–24)
BUN: 24 mg/dL (ref 8–27)
CO2: 23 mmol/L (ref 20–29)
Calcium: 9.3 mg/dL (ref 8.6–10.2)
Chloride: 101 mmol/L (ref 96–106)
Creatinine, Ser: 1.3 mg/dL — AB (ref 0.76–1.27)
Glucose: 103 mg/dL — AB (ref 70–99)
Potassium: 4.5 mmol/L (ref 3.5–5.2)
Sodium: 143 mmol/L (ref 134–144)
eGFR: 56 mL/min/1.73 — AB (ref >59)

## 2024-07-31 LAB — CBC
Hematocrit: 47.3 % (ref 37.5–51.0)
Hemoglobin: 15.3 g/dL (ref 13.0–17.7)
MCH: 29.5 pg (ref 26.6–33.0)
MCHC: 32.3 g/dL (ref 31.5–35.7)
MCV: 91 fL (ref 79–97)
Platelets: 219 x10E3/uL (ref 150–450)
RBC: 5.18 x10E6/uL (ref 4.14–5.80)
RDW: 15.7 % — ABNORMAL HIGH (ref 11.6–15.4)
WBC: 12.7 x10E3/uL — ABNORMAL HIGH (ref 3.4–10.8)

## 2024-08-03 ENCOUNTER — Ambulatory Visit: Payer: Self-pay | Admitting: Physician Assistant

## 2024-08-12 ENCOUNTER — Telehealth: Payer: Self-pay | Admitting: Pulmonary Disease

## 2024-08-12 ENCOUNTER — Other Ambulatory Visit: Payer: Self-pay

## 2024-08-12 MED ORDER — BREZTRI AEROSPHERE 160-9-4.8 MCG/ACT IN AERO: 2.0000 | INHALATION_SPRAY | Freq: Two times a day (BID) | RESPIRATORY_TRACT | 0 refills | Status: DC

## 2024-08-12 NOTE — Progress Notes (Signed)
 Pt presented in office requesting a RX for Breztri  as he really liked the sample and did well on this. Per LOV, Dr Annella stated a RX was ok if pt liked the Breztri . Pt requested a 3 month supply to [facility] delivery. RX sent. NFN

## 2024-08-12 NOTE — Telephone Encounter (Signed)
 Copied from CRM [id number]. Topic: Clinical - Medical Advice >> Aug 11, 2024  4:51 PM Corean SAUNDERS wrote: Reason for CRM: Patient is in distress about his CT scan being ordered and scheduled as well about inhaler prescription and or samples, patient complaining regarding having to wait to speak with a nurse and states he his is going to call clinic at 11:30 AM 8/20 because he states he turns his off during the day due to spam calls.

## 2024-08-12 NOTE — Telephone Encounter (Signed)
 This was handled per other encounter. NFN

## 2024-08-15 ENCOUNTER — Other Ambulatory Visit: Payer: Self-pay | Admitting: Internal Medicine

## 2024-08-25 ENCOUNTER — Other Ambulatory Visit: Payer: Self-pay | Admitting: Internal Medicine

## 2024-08-27 ENCOUNTER — Other Ambulatory Visit: Payer: Self-pay | Admitting: Internal Medicine

## 2024-08-27 MED ORDER — TRAZODONE HCL 50 MG PO TABS: 75.0000 mg | ORAL_TABLET | Freq: Every evening | ORAL | 0 refills | Status: DC | PRN

## 2024-08-27 NOTE — Telephone Encounter (Signed)
 Copied from CRM #[id number]. Topic: Clinical - Medication Refill >> Aug 27, 2024  2:18 PM Roselie C wrote: Medication: traZODone  (DESYREL ) 50 MG tablet  Has the patient contacted their pharmacy? Yes (Agent: If no, request that the patient contact the pharmacy for the refill. If patient does not wish to contact the pharmacy document the reason why and proceed with request.) (Agent: If yes, when and what did the pharmacy advise?)  This is the patient's preferred pharmacy:  [facility] - [place], [place] - [address] [place] [address] Phone: [phone] Fax: [phone]  Is this the correct pharmacy for this prescription? Yes If no, delete pharmacy and type the correct one.   Has the prescription been filled recently? No  Is the patient out of the medication? No  Has the patient been seen for an appointment in the last year OR does the patient have an upcoming appointment? Yes  Can we respond through MyChart? No  Agent: Please be advised that Rx refills may take up to 3 business days. We ask that you follow-up with your pharmacy.

## 2024-09-03 ENCOUNTER — Telehealth: Payer: Self-pay | Admitting: Internal Medicine

## 2024-09-03 NOTE — Telephone Encounter (Signed)
 Copied from CRM [id number]. Topic: Medicare AWV >> Sep 03, 2024 11:25 AM Nathanel DEL wrote: Reason for CRM: Called 09/03/2024 to sched AWV - NO VOICEMAIL  Nathanel Paschal; Care Guide Ambulatory Clinical Support [place] [facility] Direct Dial: [phone]

## 2024-09-27 ENCOUNTER — Other Ambulatory Visit: Payer: Self-pay | Admitting: Internal Medicine

## 2024-09-27 DIAGNOSIS — D6869 Other thrombophilia: Secondary | ICD-10-CM

## 2024-09-27 DIAGNOSIS — I10 Essential (primary) hypertension: Secondary | ICD-10-CM

## 2024-09-27 DIAGNOSIS — I4821 Permanent atrial fibrillation: Secondary | ICD-10-CM

## 2024-09-28 ENCOUNTER — Ambulatory Visit
Admission: EM | Admit: 2024-09-28 | Discharge: 2024-09-28 | Disposition: A | Discharge status: Home / Self Care | Attending: Family Medicine | Admitting: Family Medicine

## 2024-09-28 DIAGNOSIS — L02211 Cutaneous abscess of abdominal wall: Secondary | ICD-10-CM | POA: Diagnosis not present

## 2024-09-28 DIAGNOSIS — B369 Superficial mycosis, unspecified: Secondary | ICD-10-CM | POA: Diagnosis not present

## 2024-09-28 MED ORDER — DOXYCYCLINE HYCLATE 100 MG PO CAPS: 100.0000 mg | ORAL_CAPSULE | Freq: Two times a day (BID) | ORAL | 0 refills | Status: AC

## 2024-09-28 MED ORDER — FLUCONAZOLE 150 MG PO TABS: 150.0000 mg | ORAL_TABLET | ORAL | 0 refills | Status: AC

## 2024-09-28 NOTE — Discharge Instructions (Signed)
 Start oral fluconazole to help with the yeast infection of your abdominal skin.  Finish the 3-week course.  You have a secondary skin abscess and therefore need doxycycline to help with this as well.  This is an antibiotic that you will use for the next 10 days.

## 2024-09-28 NOTE — ED Triage Notes (Signed)
 Pt reports he has and abscess in the groin area since this morning. States it was itchy, has some blood as is open. Pt think is related to yeast infection. States he has this before.

## 2024-09-28 NOTE — ED Provider Notes (Signed)
 [facility]  Note:  This document was prepared using Conservation officer, historic buildings and may include unintentional dictation errors.  MRN: [id number] DOB: 02-Mar-1946  Subjective:   Warren [name] is a 78 y.o. male presenting for left-sided erythema over the left groin/pannus.  Patient reports slight irritation and weeping.  Has a history of yeast infections to this area.  No tenderness, fever.  Has mostly pruritus.  No current facility-administered medications for this encounter.  Current Outpatient Medications:    albuterol  (VENTOLIN  HFA) 108 (90 Base) MCG/ACT inhaler, Inhale 2 puffs into the lungs every 6 (six) hours as needed for wheezing or shortness of breath., Disp: 54 g, Rfl: 0   apixaban  (ELIQUIS ) 5 MG TABS tablet, Take 1 tablet (5 mg total) by mouth 2 (two) times daily., Disp: 180 tablet, Rfl: 3   budesonide -glycopyrrolate-formoterol  (BREZTRI  AEROSPHERE) 160-9-4.8 MCG/ACT AERO inhaler, 4 samples, Disp: , Rfl:    budesonide -glycopyrrolate-formoterol  (BREZTRI  AEROSPHERE) 160-9-4.8 MCG/ACT AERO inhaler, Inhale 2 puffs into the lungs in the morning and at bedtime., Disp: 3 each, Rfl: 0   co-enzyme Q-10 30 MG capsule, Take 30 mg by mouth daily., Disp: , Rfl:    diltiazem  (CARDIZEM  CD) 120 MG 24 hr capsule, TAKE 1 CAPSULE BY MOUTH DAILY, Disp: 90 capsule, Rfl: 3   hydrochlorothiazide  (HYDRODIURIL ) 25 MG tablet, Take 1 tablet (25 mg total) by mouth daily as needed., Disp: 90 tablet, Rfl: 0   HYDROcodone -acetaminophen  (NORCO) 10-325 MG tablet, Take 1 tablet by mouth 2 (two) times daily as needed for moderate pain (pain score 4-6). (Patient not taking: Reported on 07/30/2024), Disp: 60 tablet, Rfl: 0   HYDROcodone -acetaminophen  (NORCO) 10-325 MG tablet, Take 1 tablet by mouth 2 (two) times daily as needed for moderate pain (pain score 4-6)., Disp: 60 tablet, Rfl: 0   levothyroxine  (SYNTHROID ) 100 MCG tablet, TAKE 1 TABLET BY MOUTH DAILY  BEFORE BREAKFAST, Disp: 90  tablet, Rfl: 3   metoprolol  (TOPROL -XL) 200 MG 24 hr tablet, Take 1 tablet (200 mg total) by mouth daily. Take with or immediately following a meal, Disp: 90 tablet, Rfl: 3   Multiple Vitamins-Minerals (CENTRUM SILVER PO), Take 1 each by mouth daily., Disp: , Rfl:    omeprazole  (PRILOSEC) 40 MG capsule, Take 1 capsule (40 mg total) by mouth daily before breakfast. Needs appt, Disp: 90 capsule, Rfl: 0   potassium chloride  SA (KLOR-CON  M) 20 MEQ tablet, Take 3 tablets (60 mEq total) by mouth daily., Disp: 90 tablet, Rfl: 3   SYMBICORT  160-4.5 MCG/ACT inhaler, USE 2 INHALATIONS BY MOUTH INTO  LUNGS TWICE DAILY, Disp: 30.6 g, Rfl: 3   traZODone  (DESYREL ) 50 MG tablet, Take 1.5 tablets (75 mg total) by mouth at bedtime as needed for sleep., Disp: 45 tablet, Rfl: 0   TURMERIC PO, Take 4 capsules by mouth 2 (two) times daily., Disp: , Rfl:    No Known Allergies  Past Medical History:  Diagnosis Date   GERD (gastroesophageal reflux disease) 01/01/2020   Hypertension    Hypothyroidism    Increased prostate specific antigen (PSA) velocity    Morbid obesity (HCC)    Persistent atrial fibrillation (HCC)    Sleep-disordered breathing    Supraventricular tachycardia    a. s/p concealed left lateral pathway ablation 2002 Dr Fernande     Past Surgical History:  Procedure Laterality Date   ABLATION  2002   concealed left lateral pathway ablation by Dr Fernande   APPENDECTOMY     CARDIAC CATHETERIZATION  2001  NO CAD   NM MYOVIEW LTD     12-09   TONSILLECTOMY      Family History  Adopted: Yes  Family history unknown: Yes    Social History   Tobacco Use   Smoking status: Former    Types: Cigars    Quit date: 06/30/2024    Years since quitting: 0.2   Smokeless tobacco: Never  Vaping Use   Vaping status: Never Used  Substance Use Topics   Alcohol use: Yes    Alcohol/week: 0.0 standard drinks of alcohol    Comment: RARE   Drug use: No    ROS   Objective:   Vitals: BP (!) 152/110 (BP  Location: Right Arm)   Pulse 76   Temp 97.6 F (36.4 C) (Oral)   Resp 20   SpO2 95%   Physical Exam Constitutional:      General: He is not in acute distress.    Appearance: Normal appearance. He is well-developed and normal weight. He is not ill-appearing, toxic-appearing or diaphoretic.  HENT:     Head: Normocephalic and atraumatic.     Right Ear: External ear normal.     Left Ear: External ear normal.     Nose: Nose normal.     Mouth/Throat:     Pharynx: Oropharynx is clear.  Eyes:     General: No scleral icterus.       Right eye: No discharge.        Left eye: No discharge.     Extraocular Movements: Extraocular movements intact.  Cardiovascular:     Rate and Rhythm: Normal rate.  Pulmonary:     Effort: Pulmonary effort is normal.  Abdominal:     General: Bowel sounds are normal. There is no distension.     Palpations: Abdomen is soft. There is no mass.     Tenderness: There is no abdominal tenderness. There is no right CVA tenderness, left CVA tenderness, guarding or rebound.   Musculoskeletal:     Cervical back: Normal range of motion.  Neurological:     Mental Status: He is alert and oriented to person, place, and time.  Psychiatric:        Mood and Affect: Mood normal.        Behavior: Behavior normal.        Thought Content: Thought content normal.        Judgment: Judgment normal.    Dressing applied.  Assessment and Plan :   PDMP not reviewed this encounter.  1. Fungal infection of skin of abdomen   2. Cutaneous abscess of abdominal wall    Recommended oral fluconazole for fungal infection of the pannus.  Resolving abscess secondary to the fungal infection.  Counseled patient on potential for adverse effects with medications prescribed/recommended today, ER and return-to-clinic precautions discussed, patient verbalized understanding.    Christopher Savannah, [place] 09/28/24 1206

## 2024-10-06 ENCOUNTER — Encounter: Payer: Self-pay | Admitting: Pulmonary Disease

## 2024-10-06 ENCOUNTER — Ambulatory Visit: Admitting: Pulmonary Disease

## 2024-10-06 ENCOUNTER — Ambulatory Visit: Admitting: *Deleted

## 2024-10-06 VITALS — BP 116/74 | HR 69 | Ht 69.0 in | Wt 251.0 lb

## 2024-10-06 DIAGNOSIS — R0609 Other forms of dyspnea: Secondary | ICD-10-CM

## 2024-10-06 LAB — PULMONARY FUNCTION TEST
DL/VA % pred: 107 %
DL/VA: 4.22 ml/min/mmHg/L
DLCO cor % pred: 74 %
DLCO cor: 17.77 ml/min/mmHg
DLCO unc % pred: 74 %
DLCO unc: 17.77 ml/min/mmHg
FEF 25-75 Post: 1.44 L/s
FEF 25-75 Pre: 0.85 L/s
FEF2575-%Change-Post: 69 %
FEF2575-%Pred-Post: 74 %
FEF2575-%Pred-Pre: 43 %
FEV1-%Change-Post: 16 %
FEV1-%Pred-Post: 62 %
FEV1-%Pred-Pre: 53 %
FEV1-Post: 1.73 L
FEV1-Pre: 1.49 L
FEV1FVC-%Change-Post: 4 %
FEV1FVC-%Pred-Pre: 92 %
FEV6-%Change-Post: 12 %
FEV6-%Pred-Post: 68 %
FEV6-%Pred-Pre: 60 %
FEV6-Post: 2.49 L
FEV6-Pre: 2.21 L
FEV6FVC-%Change-Post: 1 %
FEV6FVC-%Pred-Post: 107 %
FEV6FVC-%Pred-Pre: 106 %
FVC-%Change-Post: 11 %
FVC-%Pred-Post: 63 %
FVC-%Pred-Pre: 57 %
FVC-Post: 2.49 L
FVC-Pre: 2.23 L
Post FEV1/FVC ratio: 70 %
Post FEV6/FVC ratio: 100 %
Pre FEV1/FVC ratio: 67 %
Pre FEV6/FVC Ratio: 99 %
RV % pred: 156 %
RV: 4.04 L
TLC % pred: 104 %
TLC: 7.14 L

## 2024-10-06 NOTE — Progress Notes (Signed)
 @Patient  ID: Warren [name], male    DOB: 1946-11-06, 78 y.o.   MRN: [id number]  Chief Complaint  Patient presents with   Medical Management of Chronic Issues    Pt states Still SOB     Referring provider: Keshara Kiger, Donnice SAUNDERS, MD  HPI:   78 y.o. man whom are seen for evaluation of dyspnea on exertion.  Multiple telephone encounter notes reviewed.  ED note reviewed.  Most recent cardiology note reviewed.  Returns for routine follow-up.  Escalated to Breztri  from Symbicort  at last visit.  Seem to improve.  He indicated 07/2024 it was better and we sent a prescription in after using samples.  He continues this.  Returns for PFTs today.  These are consistent with normal spirometry, significant bronchodilator sponsor FEV1 and air trapping.  The pattern is consistent with small airway disease or asthma.  HPI initial visit: Patient notes several years ago he could walk 5 miles without issue.  He has become progressively short of breath over the last several years.  Really would last couple years.  Now minimally active.  Relatively sedentary.  Not walking.  Notes he is in permanent atrial fibrillation.  Notes he feels like he cannot take a deep breath his stomach stops him, there is a barrier.  Former smoker, cigar a day.  Quit recently.  Had echocardiogram in 2016 that showed severely dilated LA, minimal MVR, normal RV size and function, mildly dilated RA, mildly elevated estimated PASP.  We discussed at length the likely multiple etiologies of her shortness of breath.  Likely that in isolation each 1 is not a big deal but in combination they produce severe symptoms.  Fortunately symptoms are better when using Symbicort .  2 puffs twice a day.  Also using albuterol  4 times a day.  He finds interval improvement albeit mild with every inhaler use.  We discussed his chest x-ray, most recent chest imaging 12/2018 for my review interpretation reveals clear lungs and hyperinflation of the lateral.  Discussed  likely air trapping versus hyperinflation causing shortness of breath.  Which inhalers would help.  This could be related to asthma or smoking-related lung disease which we discussed.  We discussed further diagnostic modalities.  Trying to improve his symptoms we can be more active.  Which I think ultimately will certainly help.  But we could be limited and there will probably be a limit to what we can achieve.    Questionaires / Pulmonary Flowsheets:   ACT:      No data to display          MMRC:     No data to display          Epworth:      No data to display          Tests:   FENO:  No results found for: NITRICOXIDE  PFT:    Latest Ref Rng & Units 10/06/2024    1:43 PM  PFT Results  FVC-Pre L 2.23  P  FVC-Predicted Pre % 57  P  FVC-Post L 2.49  P  FVC-Predicted Post % 63  P  Pre FEV1/FVC % % 67  P  Post FEV1/FCV % % 70  P  FEV1-Pre L 1.49  P  FEV1-Predicted Pre % 53  P  FEV1-Post L 1.73  P  DLCO uncorrected ml/min/mmHg 17.77  P  DLCO UNC% % 74  P  DLCO corrected ml/min/mmHg 17.77  P  DLCO COR %Predicted % 74  P  DLVA Predicted % 107  P  TLC L 7.14  P  TLC % Predicted % 104  P  RV % Predicted % 156  P    P Preliminary result    WALK:      No data to display          Imaging: Personally reviewed and as per EMR in discussion this note No results found.  Lab Results: Personally reviewed CBC    Component Value Date/Time   WBC 12.7 (H) 07/30/2024 1625   WBC 8.7 03/18/2024 1435   RBC 5.18 07/30/2024 1625   RBC 4.94 03/18/2024 1435   HGB 15.3 07/30/2024 1625   HCT 47.3 07/30/2024 1625   PLT 219 07/30/2024 1625   MCV 91 07/30/2024 1625   MCH 29.5 07/30/2024 1625   MCHC 32.3 07/30/2024 1625   MCHC 32.3 03/18/2024 1435   RDW 15.7 (H) 07/30/2024 1625   LYMPHSABS 1.5 03/18/2024 1435   MONOABS 0.7 03/18/2024 1435   EOSABS 0.2 03/18/2024 1435   BASOSABS 0.1 03/18/2024 1435    BMET    Component Value Date/Time   NA 143 07/30/2024  1625   K 4.5 07/30/2024 1625   CL 101 07/30/2024 1625   CO2 23 07/30/2024 1625   GLUCOSE 103 (H) 07/30/2024 1625   GLUCOSE 98 03/18/2024 1435   GLUCOSE 99 11/26/2006 1608   BUN 24 07/30/2024 1625   CREATININE 1.30 (H) 07/30/2024 1625   CREATININE 1.64 (H) 08/17/2020 1438   CALCIUM 9.3 07/30/2024 1625   GFRNONAA 53 (L) 01/29/2019 1613   GFRAA 61 01/29/2019 1613    BNP No results found for: BNP  ProBNP    Component Value Date/Time   PROBNP 194.0 (H) 03/16/2009 0445    Specialty Problems   None   No Known Allergies  Immunization History  Administered Date(s) Administered   Fluad Quad(high Dose 65+) 01/01/2020, 10/18/2021, 10/02/2022   INFLUENZA, HIGH DOSE SEASONAL PF 10/31/2015, 10/16/2017, 11/28/2018   Influenza Whole 12/24/2005, 10/12/2008, 12/13/2009, 09/13/2010   Influenza,inj,Quad PF,6+ Mos 10/25/2014   PFIZER(Purple Top)SARS-COV-2 Vaccination 07/18/2020, 08/09/2020, 02/15/2021   PNEUMOCOCCAL CONJUGATE-20 07/23/2023   Pneumococcal Conjugate-13 04/21/2014   Pneumococcal Polysaccharide-23 12/14/2010, 10/16/2017   Td 10/12/2008, 11/28/2018    Past Medical History:  Diagnosis Date   GERD (gastroesophageal reflux disease) 01/01/2020   Hypertension    Hypothyroidism    Increased prostate specific antigen (PSA) velocity    Morbid obesity (HCC)    Persistent atrial fibrillation (HCC)    Sleep-disordered breathing    Supraventricular tachycardia    a. s/p concealed left lateral pathway ablation 2002 Dr Fernande    Tobacco History: Social History   Tobacco Use  Smoking Status Former   Types: Cigars   Quit date: 06/30/2024   Years since quitting: 0.2  Smokeless Tobacco Never   Counseling given: Not Answered   Continue to not smoke  Outpatient Encounter Medications as of 10/06/2024  Medication Sig   albuterol  (VENTOLIN  HFA) 108 (90 Base) MCG/ACT inhaler Inhale 2 puffs into the lungs every 6 (six) hours as needed for wheezing or shortness of breath.    apixaban  (ELIQUIS ) 5 MG TABS tablet Take 1 tablet (5 mg total) by mouth 2 (two) times daily.   budesonide -glycopyrrolate-formoterol  (BREZTRI  AEROSPHERE) 160-9-4.8 MCG/ACT AERO inhaler 4 samples   budesonide -glycopyrrolate-formoterol  (BREZTRI  AEROSPHERE) 160-9-4.8 MCG/ACT AERO inhaler Inhale 2 puffs into the lungs in the morning and at bedtime.   co-enzyme Q-10 30 MG capsule Take 30 mg by mouth daily.   diltiazem  (  CARDIZEM  CD) 120 MG 24 hr capsule TAKE 1 CAPSULE BY MOUTH DAILY   doxycycline (VIBRAMYCIN) 100 MG capsule Take 1 capsule (100 mg total) by mouth 2 (two) times daily.   fluconazole (DIFLUCAN) 150 MG tablet Take 1 tablet (150 mg total) by mouth every 3 (three) days.   hydrochlorothiazide  (HYDRODIURIL ) 25 MG tablet Take 1 tablet (25 mg total) by mouth daily as needed.   HYDROcodone -acetaminophen  (NORCO) 10-325 MG tablet Take 1 tablet by mouth 2 (two) times daily as needed for moderate pain (pain score 4-6).   HYDROcodone -acetaminophen  (NORCO) 10-325 MG tablet Take 1 tablet by mouth 2 (two) times daily as needed for moderate pain (pain score 4-6).   levothyroxine  (SYNTHROID ) 100 MCG tablet TAKE 1 TABLET BY MOUTH DAILY  BEFORE BREAKFAST   metoprolol  (TOPROL -XL) 200 MG 24 hr tablet Take 1 tablet (200 mg total) by mouth daily. Take with or immediately following a meal   Multiple Vitamins-Minerals (CENTRUM SILVER PO) Take 1 each by mouth daily.   omeprazole  (PRILOSEC) 40 MG capsule Take 1 capsule (40 mg total) by mouth daily before breakfast. Needs appt   potassium chloride  SA (KLOR-CON  M) 20 MEQ tablet Take 3 tablets (60 mEq total) by mouth daily.   SYMBICORT  160-4.5 MCG/ACT inhaler USE 2 INHALATIONS BY MOUTH INTO  LUNGS TWICE DAILY   traZODone  (DESYREL ) 50 MG tablet Take 1.5 tablets (75 mg total) by mouth at bedtime as needed for sleep.   TURMERIC PO Take 4 capsules by mouth 2 (two) times daily.   No facility-administered encounter medications on file as of 10/06/2024.     Review of  Systems  Review of Systems  No chest pain with exertion.  No orthopnea or PND.  Comprehensive review of systems otherwise negative. Physical Exam  BP 116/74   Pulse 69   Ht 5' 9 (1.753 m)   Wt 251 lb (113.9 kg)   SpO2 94%   BMI 37.07 kg/m   Wt Readings from Last 5 Encounters:  10/06/24 251 lb (113.9 kg)  07/30/24 247 lb (112 kg)  07/07/24 254 lb 9.6 oz (115.5 kg)  03/18/24 261 lb 4 oz (118.5 kg)  07/23/23 231 lb (104.8 kg)    BMI Readings from Last 5 Encounters:  10/06/24 37.07 kg/m  07/30/24 34.45 kg/m  07/07/24 35.51 kg/m  03/18/24 36.44 kg/m  07/23/23 32.22 kg/m     Physical Exam General: Sitting in chair, no acute distress Eyes: EOMI, no icterus Neck: Supple, no JVP Pulmonary: Clear, no work of breathing Cardiovascular: Irregularly irregular, warm Abdomen: Nondistended MSK: No synovitis, no joint effusion Neuro: Normal gait, no weakness Psych: Normal mood, full affect   Assessment & Plan:   Dyspnea on exertion: Suspect multifactorial.  Related to deconditioning, much less active than prior.  Related to atrial fibrillation.  Related to habitus, truncal obesity.  Related to likely diastolic dysfunction given severely dilated left atrium in 2016 with minimal or minor valvular dysfunction at that time.  As well as development of likely pulmonary hypertension was evidence by estimated elevated PASP in 2016.  Likely 2 disease with dilated left atrium, pulmonary venous hypertension etc.  Suspect damage from tobacco smoking.  PFTs for further evaluation.  Chest x-ray is hyperinflated, as would be expected.  Symbicort  has helped which is encouraging.  Subsequent improvement with Breztri .  To continue this.  Asthma: Clinical diagnosis with significant bronchodilator response and air trapping on PFTs.  Continue triple inhaled therapy as above.   Return in about 3 months (around  01/06/2025) for f/u Dr. Annella.   Donnice JONELLE Annella, MD 10/06/2024

## 2024-10-06 NOTE — Progress Notes (Signed)
 Full PFT performed today.

## 2024-10-06 NOTE — Patient Instructions (Signed)
 I am glad you are doing well  No changes in medication, can you do the Breztri  2 puffs in the morning and 2 puffs in the evening every day, rinse her mouth out with water after use  You can use albuterol  every 4 hours as needed for shortness of breath, 2 puffs  Return to clinic in 3 months or sooner as needed with Dr. Annella

## 2024-10-06 NOTE — Patient Instructions (Signed)
 Full PFT performed today.

## 2024-10-17 ENCOUNTER — Other Ambulatory Visit: Payer: Self-pay | Admitting: Internal Medicine

## 2024-10-18 ENCOUNTER — Other Ambulatory Visit: Payer: Self-pay | Admitting: Pulmonary Disease

## 2024-10-24 ENCOUNTER — Other Ambulatory Visit: Payer: Self-pay | Admitting: Physician Assistant

## 2024-10-24 DIAGNOSIS — D6869 Other thrombophilia: Secondary | ICD-10-CM

## 2024-10-24 DIAGNOSIS — I10 Essential (primary) hypertension: Secondary | ICD-10-CM

## 2024-10-24 DIAGNOSIS — I4821 Permanent atrial fibrillation: Secondary | ICD-10-CM

## 2024-10-29 ENCOUNTER — Ambulatory Visit: Admitting: Cardiology

## 2024-10-30 ENCOUNTER — Ambulatory Visit: Admitting: Student in an Organized Health Care Education/Training Program

## 2024-11-06 ENCOUNTER — Telehealth: Payer: Self-pay | Admitting: Internal Medicine

## 2024-11-06 DIAGNOSIS — G8929 Other chronic pain: Secondary | ICD-10-CM

## 2024-11-06 MED ORDER — HYDROCODONE-ACETAMINOPHEN 10-325 MG PO TABS: 1.0000 | ORAL_TABLET | Freq: Two times a day (BID) | ORAL | 0 refills | Status: AC | PRN

## 2024-11-06 NOTE — Telephone Encounter (Signed)
 Requesting: hydrocodone  10-325mg   Contract: 07/31/23 UDS:03/18/24 Last Visit:03/18/24 Next Visit: None Last Refill: 06/25/24 #60 and 0RF (x2)  Please Advise

## 2024-11-06 NOTE — Telephone Encounter (Signed)
 Dr. Amon wanted to see this patient back in June. Please sched. Will send a shorter 20 tab supply.

## 2024-11-06 NOTE — Telephone Encounter (Unsigned)
 Copied from CRM #[id number]. Topic: Clinical - Medication Refill >> Nov 06, 2024  1:08 PM Drema MATSU wrote: Medication: HYDROcodone -acetaminophen  (NORCO) 10-325 MG tablet  Has the patient contacted their pharmacy? No (Agent: If no, request that the patient contact the pharmacy for the refill. If patient does not wish to contact the pharmacy document the reason why and proceed with request.)  (Agent: If yes, when and what did the pharmacy advise?)  This is the patient's preferred pharmacy:  [facility] [phone] - [place], [place] - [address] AT [facility] [address] [place] [address] Phone: [phone] Fax: [phone]   Is this the correct pharmacy for this prescription? Yes If no, delete pharmacy and type the correct one.   Has the prescription been filled recently? Yes  Is the patient out of the medication? No 1 week left  Has the patient been seen for an appointment in the last year OR does the patient have an upcoming appointment? Yes  Can we respond through MyChart? No  Agent: Please be advised that Rx refills may take up to 3 business days. We ask that you follow-up with your pharmacy.

## 2024-11-07 ENCOUNTER — Other Ambulatory Visit: Payer: Self-pay | Admitting: Internal Medicine

## 2024-11-18 NOTE — Telephone Encounter (Signed)
Called pt unable to leave vm.

## 2024-11-18 NOTE — Telephone Encounter (Signed)
 Dena- can you try reaching out to Pt to get him set up for a visit w/ Paz for when he returns please?

## 2025-01-10 ENCOUNTER — Other Ambulatory Visit: Payer: Self-pay | Admitting: Internal Medicine

## 2025-01-12 ENCOUNTER — Ambulatory Visit: Payer: Self-pay | Admitting: Pulmonary Disease

## 2025-01-12 NOTE — Telephone Encounter (Signed)
 FYI Only or Action Required?: FYI only for provider: See note below.  Patient is followed in Pulmonology for DOE, last seen on 10/06/2024 by Hunsucker, Donnice SAUNDERS, MD.  Called Nurse Triage reporting Shortness of Breath.  Symptoms began several months ago.  Interventions attempted: Rescue inhaler and Maintenance inhaler.  Symptoms are: unchanged.  Triage Disposition: See HCP Within 4 Hours (Or PCP Triage)  Patient/caregiver understands and will follow disposition?: Yes      Message from [facility] B sent at 01/12/2025 10:39 AM EST  Reason for Triage: Breathing issues when moving. SOB   Reason for Disposition  [1] MILD difficulty breathing (e.g., minimal/no SOB at rest, SOB with walking, pulse < 100) AND [2] NEW-onset or WORSE than normal  Answer Assessment - Initial Assessment Questions 1. RESPIRATORY STATUS: Describe your breathing? (e.g., wheezing, shortness of breath, unable to speak, severe coughing)      SOB on exertion  2. ONSET: When did this breathing problem begin?      Ongoing  3. PATTERN Does the difficult breathing come and go, or has it been constant since it started?      Constant   4. SEVERITY: How bad is your breathing? (e.g., mild, moderate, severe)      Mild   5. RECURRENT SYMPTOM: Have you had difficulty breathing before? If Yes, ask: When was the last time? and What happened that time?      Yes  6. CARDIAC HISTORY: Do you have any history of heart disease? (e.g., heart attack, angina, bypass surgery, angioplasty)      HTN  7. LUNG HISTORY: Do you have any history of lung disease?  (e.g., pulmonary embolus, asthma, emphysema)     No   8. CAUSE: What do you think is causing the breathing problem?      Unsure  9. OTHER SYMPTOMS: Do you have any other symptoms? (e.g., chest pain, cough, dizziness, fever, runny nose)     Cough     Patient called in to triage with complaints of SOB on exertion. This has been ongoing for for a  while per the patient. Inhalers are not providing relief.  The patient stated he is using both inhalers.    Appointment offered for  further evaluation; Patient stated he will need to call his friend who is his source of transportation to figure out a day/time that works for her to be able to bring him to an appointment. He stated he will call back for scheduling.  Protocols used: Breathing Difficulty-A-AH

## 2025-01-14 ENCOUNTER — Encounter: Payer: Self-pay | Admitting: Cardiology

## 2025-01-14 ENCOUNTER — Ambulatory Visit: Admitting: Cardiology

## 2025-01-14 ENCOUNTER — Other Ambulatory Visit (HOSPITAL_COMMUNITY): Payer: Self-pay

## 2025-01-14 VITALS — BP 136/76 | HR 77 | Ht 71.0 in | Wt 258.6 lb

## 2025-01-14 DIAGNOSIS — E785 Hyperlipidemia, unspecified: Secondary | ICD-10-CM

## 2025-01-14 DIAGNOSIS — R0609 Other forms of dyspnea: Secondary | ICD-10-CM | POA: Insufficient documentation

## 2025-01-14 DIAGNOSIS — I10 Essential (primary) hypertension: Secondary | ICD-10-CM

## 2025-01-14 DIAGNOSIS — R072 Precordial pain: Secondary | ICD-10-CM | POA: Diagnosis not present

## 2025-01-14 DIAGNOSIS — I4821 Permanent atrial fibrillation: Secondary | ICD-10-CM | POA: Diagnosis not present

## 2025-01-14 MED ORDER — FUROSEMIDE 40 MG PO TABS
40.0000 mg | ORAL_TABLET | Freq: Every day | ORAL | 3 refills | Status: AC
  Filled 2025-01-14: qty 90, 90d supply, fill #0

## 2025-01-14 NOTE — Progress Notes (Signed)
 " Cardiology Office Note:  .   Date:  01/14/2025  ID:  Warren [name], DOB [birth year], MRN [id number] PCP: Amon Aloysius BRAVO, MD  [facility] Providers Cardiologist:  Newman Lawrence, MD PCP: Amon Aloysius BRAVO, MD  Chief Complaint  Patient presents with   Dyspnea on exertion     Warren [name] is a 79 y.o. male with hypertension, permanent A-fib, SVT, hypothyroidism, emphysema  Discussed the use of AI scribe software for clinical note transcription with the patient, who gave verbal consent to proceed.  History of Present Illness Warren [name] is a 79 year old male with atrial fibrillation and obesity who presents with worsening shortness of breath.  Over the past 4 to 5 months he has had progressively worsening shortness of breath with minimal exertion. Symptoms are position dependent, worse when lying flat and improved when sitting up. He denies recent chest pain. He has occasional lightheadedness when standing quickly but no syncope.  His diet is mainly delivered food, likely high in salt. He takes diltiazem  120 mg daily, Eliquis  5 mg twice a day, hydrochlorothiazide  as needed about three times per week, and metoprolol  200 mg daily.  He uses two inhalers without symptom relief. He is scheduled to see a lung specialist next week for further evaluation and lung function testing.  He lives alone and a neighbor assists with daily activities.      There were no vitals filed for this visit.    Review of Systems  Cardiovascular:  Positive for dyspnea on exertion. Negative for chest pain, leg swelling, palpitations and syncope.        Studies Reviewed: SABRA       EKG 07/30/2024: Atrial fibrillation 97 bpm Occasional PVC or aberrantly conducted complexes  Labs 07/2024: Hb 15.3 Cr 1.3, eGFR 56 TSH 2.6  2024: Chol 140, TG 119, HDL 39, LDL 77  Risk Assessment/Calculations:    CHA2DS2-VASc Score = 3  This indicates a 3.2% annual risk of stroke. The patient's score is  based upon: CHF History: 0 HTN History: 1 Diabetes History: 0 Stroke History: 0 Vascular Disease History: 0 Age Score: 2 Gender Score: 0      Physical Exam Vitals and nursing note reviewed.  Constitutional:      General: He is not in acute distress. Neck:     Vascular: No JVD.  Cardiovascular:     Rate and Rhythm: Normal rate and regular rhythm.     Heart sounds: Normal heart sounds. No murmur heard. Pulmonary:     Effort: Pulmonary effort is normal.     Breath sounds: Normal breath sounds. No wheezing or rales.  Musculoskeletal:     Right lower leg: No edema.     Left lower leg: No edema.      VISIT DIAGNOSES:   ICD-10-CM   1. DOE (dyspnea on exertion)  R06.09 ECHOCARDIOGRAM COMPLETE    Basic metabolic panel with GFR    Pro b natriuretic peptide (BNP)    CANCELED: Basic metabolic panel with GFR    CANCELED: Pro b natriuretic peptide (BNP)    2. Permanent atrial fibrillation (HCC)  I48.21     3. Primary hypertension  I10     4. Precordial pain  R07.2     5. Dyslipidemia  E78.5 Lipid panel    CANCELED: Lipid panel       Warren [name] is a 79 y.o. male with hypertension, permanent A-fib, SVT, hypothyroidism, emphysema Assessment & Plan Heart failure: Suspected congestive heart failure, possible  due to longstanding atrial fibrillation, obesity, and deconditioning. Symptoms include dyspnea, leg swelling, and nocturia. Differential includes cardiac pumping or relaxation issues. - Discontinued hydrochlorothiazide . - Initiated Lasix  40 mg once daily. - Ordered echocardiogram to assess heart function. - Provided educational material on heart failure diet. - Advised to take Lasix  early in the morning to minimize nocturia. - Will check blood work in one week.  Permanent atrial fibrillation: Chronic atrial fibrillation likely contributing to heart failure symptoms. Managed with diltiazem  and Eliquis . - Continue diltiazem  120 mg. - Continue Eliquis  5 mg twice  daily.  Obesity and deconditioning contributing to dyspnea: Obesity and deconditioning contributing to dyspnea. No improvement with current inhaler use. Lifestyle modifications recommended. - Advised dietary changes to reduce salt intake and increase home-cooked meals. - Continue current medications including metoprolol  200 mg.    Meds ordered this encounter  Medications   furosemide  (LASIX ) 40 MG tablet    Sig: Take 1 tablet (40 mg total) by mouth daily.    Dispense:  90 tablet    Refill:  3     F/u in 6 weeks  Signed, Newman JINNY Lawrence, MD  "

## 2025-01-14 NOTE — Patient Instructions (Signed)
 Medication Instructions:  STOP hydrochlorothiazide   START Lasix  40 mg daily   *If you need a refill on your cardiac medications before your next appointment, please call your pharmacy*  Lab Work IN 1 WEEK: BMP PROBNP LIPID PANEL   If you have labs (blood work) drawn today and your tests are completely normal, you will receive your results only by: MyChart Message (if you have MyChart) OR A paper copy in the mail If you have any lab test that is abnormal or we need to change your treatment, we will call you to review the results.  Testing/Procedures: ECHOCARDIOGRAM  Your physician has requested that you have an echocardiogram. Echocardiography is a painless test that uses sound waves to create images of your heart. It provides your doctor with information about the size and shape of your heart and how well your hearts chambers and valves are working. This procedure takes approximately one hour. There are no restrictions for this procedure. Please do NOT wear cologne, perfume, aftershave, or lotions (deodorant is allowed). Please arrive 15 minutes prior to your appointment time.  Please note: We ask at that you not bring children with you during ultrasound (echo/ vascular) testing. Due to room size and safety concerns, children are not allowed in the ultrasound rooms during exams. Our front office staff cannot provide observation of children in our lobby area while testing is being conducted. An adult accompanying a patient to their appointment will only be allowed in the ultrasound room at the discretion of the ultrasound technician under special circumstances. We apologize for any inconvenience.   Follow-Up: At [facility], you and your health needs are our priority.  As part of our continuing mission to provide you with exceptional heart care, our providers are all part of one team.  This team includes your primary Cardiologist (physician) and Advanced Practice Providers or  APPs (Physician Assistants and Nurse Practitioners) who all work together to provide you with the care you need, when you need it.  Your next appointment:   6 week(s)  Provider:   Newman JINNY Lawrence, MD    We recommend signing up for the patient portal called MyChart.  Sign up information is provided on this After Visit Summary.  MyChart is used to connect with patients for Virtual Visits (Telemedicine).  Patients are able to view lab/test results, encounter notes, upcoming appointments, etc.  Non-urgent messages can be sent to your provider as well.   To learn more about what you can do with MyChart, go to [email or web address].   Other Instructions Heart Failure: Eating Plan Heart failure is a long-term condition where the heart can't pump enough blood through the body. When this happens, parts of the body don't get the blood and oxygen they need. Living with heart failure can be hard. But a healthy lifestyle and choosing the right foods may help to improve your symptoms. If you have heart failure, your eating plan will include limiting the amount of salt, also called sodium, and unhealthy fats you eat. What are tips for following this plan? Reading food labels Check food labels for the amount of sodium per serving. Choose foods that have less than 140 mg (milligrams) of sodium in each serving. Check food labels for the number of calories per serving. This is important if you need to limit your daily calorie intake to lose weight. Check food labels for the serving size. If you eat more than one serving, you'll be eating more sodium and calories than what's  listed on the label. Look for foods with the words sodium-free, very low sodium, or low sodium on the package. Foods labeled as reduced sodium, lightly salted, or no salt added may still have more sodium than what's recommended for you. Cooking Avoid adding salt when cooking. Before using any salt substitutes, talk with  your health care provider or an expert in healthy eating called a dietitian. Season food with salt-free seasonings, spices, or herbs. Check the label of seasoning mixes to make sure they don't contain salt. Cook with heart-healthy oils, such as olive, canola, soybean, or sunflower oil. Do not fry foods. Cook foods using low-fat methods, like baking, boiling, grilling, and broiling. Limit unhealthy fats when cooking. To do this: Remove the skin from poultry, such as chicken. Remove all the fat you can see on meats. Skim the fat off from stews, soups, and gravies before serving them. Meal planning  Limit your intake of: Processed, canned, or prepackaged foods. Foods that are high in trans fat, such as fried foods. Sweets, desserts, sugary drinks, and other foods with added sugar. Full-fat dairy products, such as whole milk. Eat a balanced diet. This may include: 4-5 servings of fruit each day and 4-5 servings of vegetables each day. At each meal, try to fill one-half of your plate with fruits and vegetables. Up to 6-8 servings of whole grains each day. Up to 2 servings of lean meat, poultry, or fish each day. One serving of meat is equal to 3 oz (85 g). This is about the same size as a deck of cards. 2 servings of low-fat dairy each day. Heart-healthy fats. Healthy fats called omega-3 fatty acids are a good choice. They're found in foods such as flaxseed and cold-water fish like sardines, salmon, and mackerel. Aim to eat 25-35 g (grams) of fiber a day. Foods that are high in fiber include apples, broccoli, carrots, beans, peas, and whole grains. Do not add salt or condiments that contain salt (such as soy sauce) to foods before eating. When eating at a restaurant, ask that your food be prepared with less salt or no salt, if possible. Try to eat 2 or more plant-based or meat-free meals each week. Cook more meals at home and eat less at restaurants, buffets, and fast food places. General  information Do not eat more than 2,300 mg of sodium a day. The amount of sodium that's recommended for you may be lower, depending on your condition. Stay at a healthy weight as told. Ask your provider what a healthy weight is for you. Check your weight every day. Work with your provider and dietitian to make a plan that will help you lose weight or stay at your current weight. Limit how much fluid you drink. Ask your provider or dietitian how much fluid you can have each day. Limit or avoid alcohol as told. Recommended foods Fruits All fresh, frozen, and canned fruits. Dried fruits, such as raisins, prunes, and cranberries. Vegetables All fresh vegetables. Vegetables that are frozen without sauce or added salt. Low-sodium or sodium-free canned vegetables. Grains Bread with less than 80 mg of sodium per slice. Whole-wheat pasta, quinoa, and brown rice. Oats and oatmeal. Barley. Millet. Grits and cream of wheat. Whole-grain and whole-wheat cold cereal. Meats and other protein foods Lean cuts of meat. Skinless chicken and turkey. Fish with high omega-3 fatty acids, such as salmon, sardines, and other cold-water fishes. Eggs. Dried beans, peas, and edamame. Unsalted nuts and nut butters. Dairy Low-fat or nonfat (skim)  milk and dried milk. Rice milk, soy milk, and almond milk. Low-fat or nonfat yogurt. Small amounts of reduced-sodium block cheese. Low-sodium cottage cheese. Fats and oils Olive, canola, soybean, flaxseed, avocado, or sunflower oil. Sweets and desserts Applesauce. Granola bars. Sugar-free pudding and gelatin. Frozen fruit bars. Seasoning and other foods Fresh and dried herbs. Lemon or lime juice. Vinegar. Low-sodium ketchup. Salt-free marinades, salad dressings, sauces, and seasonings. The items listed above may not be all the foods and drinks you can have. Talk with a dietitian to learn more. Foods to avoid Fruits Fruits that are dried with preservatives that contain  sodium. Vegetables Canned vegetables. Frozen vegetables with sauce or seasonings. Creamed vegetables. French fries. Onion rings. Pickled vegetables and sauerkraut. Grains Bread with more than 80 mg of sodium per slice. Hot or cold cereal with more than 140 mg sodium per serving. Salted pretzels and crackers. Prepackaged breadcrumbs. Bagels, croissants, and biscuits. Meats and other protein foods Ribs and chicken wings. Bacon, ham, pepperoni, bologna, salami, and packaged luncheon meats. Hot dogs, bratwurst, and sausage. Canned meat. Smoked meat and fish. Salted nuts and seeds. Dairy Whole milk, half-and-half, and cream. Buttermilk. Processed cheese, cheese spreads, and cheese curds. Regular cottage cheese. Feta cheese. Shredded cheese. String cheese. Fats and oils Butter, lard, shortening, ghee, and bacon fat. Canned and packaged gravies. Seasoning and other foods Onion salt, garlic salt, table salt, and sea salt. Marinades. Regular salad dressings. Relishes, pickles, and olives. Meat flavorings and tenderizers, and bouillon cubes. Horseradish, ketchup, and mustard. Worcestershire sauce. Teriyaki sauce, soy sauce (including reduced sodium). Hot sauce and Tabasco sauce. Steak sauce, fish sauce, oyster sauce, and cocktail sauce. Taco seasonings. Barbecue sauce. Tartar sauce. The items listed above may not be all the foods and drinks you should avoid. Talk with a dietitian to learn more. This information is not intended to replace advice given to you by your health care provider. Make sure you discuss any questions you have with your health care provider. Document Revised: 08/05/2023 Document Reviewed: 07/25/2023 Elsevier Patient Education  2024 [employer].

## 2025-01-22 ENCOUNTER — Encounter (HOSPITAL_COMMUNITY): Payer: Self-pay | Admitting: Cardiology

## 2025-01-26 ENCOUNTER — Other Ambulatory Visit (HOSPITAL_COMMUNITY): Payer: Self-pay

## 2025-01-28 ENCOUNTER — Telehealth: Payer: Self-pay | Admitting: Cardiology

## 2025-01-28 MED ORDER — FUROSEMIDE 40 MG PO TABS: 40.0000 mg | ORAL_TABLET | Freq: Every day | ORAL | 3 refills | Status: AC

## 2025-01-28 NOTE — Telephone Encounter (Signed)
Refill sent to the correct pharmacy

## 2025-01-28 NOTE — Telephone Encounter (Signed)
" °*  STAT* If patient is at the pharmacy, call can be transferred to refill team.   1. Which medications need to be refilled? (please list name of each medication and dose if known) furosemide  (LASIX ) 40 MG tablet   2. Which pharmacy/location (including street and city if local pharmacy) is medication to be sent to? [facility] [phone] - [place], [place] - [address] Phone: [phone]  Fax: [phone]     3. Do they need a 30 day or 90 day supply? 90 Medication was sent to [facility] by mistake please send it  to the above phasrmacy "
